# Patient Record
Sex: Male | Born: 1955
Health system: Southern US, Community
[De-identification: ages and names within clinical notes are randomized; demographics above are authoritative.]

## PROBLEM LIST (undated history)

## (undated) DIAGNOSIS — N529 Male erectile dysfunction, unspecified: Secondary | ICD-10-CM

## (undated) DIAGNOSIS — E785 Hyperlipidemia, unspecified: Secondary | ICD-10-CM

## (undated) DIAGNOSIS — K219 Gastro-esophageal reflux disease without esophagitis: Secondary | ICD-10-CM

## (undated) DIAGNOSIS — I10 Essential (primary) hypertension: Secondary | ICD-10-CM

## (undated) HISTORY — DX: Hyperlipidemia, unspecified: E78.5

## (undated) HISTORY — DX: Essential (primary) hypertension: I10

## (undated) HISTORY — PX: ANKLE SURGERY: SHX546

## (undated) HISTORY — DX: Male erectile dysfunction, unspecified: N52.9

## (undated) HISTORY — DX: Gastro-esophageal reflux disease without esophagitis: K21.9

## (undated) MED FILL — Fosaprepitant Dimeglumine For IV Infusion 150 MG (Base Eq): INTRAVENOUS | Qty: 5 | Status: AC

---

## 2005-02-19 ENCOUNTER — Ambulatory Visit: Payer: Self-pay | Admitting: Internal Medicine

## 2005-02-28 ENCOUNTER — Ambulatory Visit: Payer: Self-pay

## 2005-03-12 ENCOUNTER — Ambulatory Visit: Payer: Self-pay | Admitting: Internal Medicine

## 2005-03-15 ENCOUNTER — Ambulatory Visit: Payer: Self-pay | Admitting: Internal Medicine

## 2005-10-25 ENCOUNTER — Ambulatory Visit: Payer: Self-pay | Admitting: Internal Medicine

## 2006-02-28 ENCOUNTER — Ambulatory Visit: Payer: Self-pay | Admitting: Internal Medicine

## 2006-03-13 ENCOUNTER — Encounter: Admission: RE | Admit: 2006-03-13 | Discharge: 2006-03-13 | Payer: Self-pay | Admitting: General Surgery

## 2008-07-20 ENCOUNTER — Ambulatory Visit (HOSPITAL_COMMUNITY): Admission: RE | Admit: 2008-07-20 | Discharge: 2008-07-20 | Payer: Self-pay | Admitting: Chiropractic Medicine

## 2008-09-27 ENCOUNTER — Ambulatory Visit: Payer: Self-pay | Admitting: Internal Medicine

## 2008-09-27 DIAGNOSIS — I10 Essential (primary) hypertension: Secondary | ICD-10-CM | POA: Insufficient documentation

## 2008-09-27 DIAGNOSIS — K219 Gastro-esophageal reflux disease without esophagitis: Secondary | ICD-10-CM | POA: Insufficient documentation

## 2008-09-27 DIAGNOSIS — M549 Dorsalgia, unspecified: Secondary | ICD-10-CM | POA: Insufficient documentation

## 2008-10-10 ENCOUNTER — Encounter: Payer: Self-pay | Admitting: Internal Medicine

## 2009-01-09 ENCOUNTER — Ambulatory Visit: Payer: Self-pay | Admitting: Internal Medicine

## 2009-01-10 ENCOUNTER — Encounter (INDEPENDENT_AMBULATORY_CARE_PROVIDER_SITE_OTHER): Payer: Self-pay | Admitting: *Deleted

## 2009-01-10 LAB — CONVERTED CEMR LAB: PSA: 0.67 ng/mL (ref 0.10–4.00)

## 2009-02-07 ENCOUNTER — Ambulatory Visit: Payer: Self-pay | Admitting: Internal Medicine

## 2009-02-23 ENCOUNTER — Ambulatory Visit: Payer: Self-pay | Admitting: Internal Medicine

## 2009-02-28 ENCOUNTER — Encounter (INDEPENDENT_AMBULATORY_CARE_PROVIDER_SITE_OTHER): Payer: Self-pay | Admitting: *Deleted

## 2010-07-13 ENCOUNTER — Encounter (INDEPENDENT_AMBULATORY_CARE_PROVIDER_SITE_OTHER): Payer: Self-pay | Admitting: *Deleted

## 2010-11-01 ENCOUNTER — Ambulatory Visit: Payer: Self-pay | Admitting: Internal Medicine

## 2010-11-02 ENCOUNTER — Encounter: Payer: Self-pay | Admitting: Internal Medicine

## 2010-11-05 LAB — CONVERTED CEMR LAB
Albumin: 4.3 g/dL (ref 3.5–5.2)
Alkaline Phosphatase: 44 units/L (ref 39–117)
BUN: 15 mg/dL (ref 6–23)
Basophils Absolute: 0 10*3/uL (ref 0.0–0.1)
Basophils Relative: 0.6 % (ref 0.0–3.0)
Bilirubin, Direct: 0.1 mg/dL (ref 0.0–0.3)
CO2: 27 meq/L (ref 19–32)
Calcium: 10 mg/dL (ref 8.4–10.5)
Chloride: 109 meq/L (ref 96–112)
Cholesterol: 215 mg/dL — ABNORMAL HIGH (ref 0–200)
Eosinophils Relative: 3.8 % (ref 0.0–5.0)
Glucose, Bld: 99 mg/dL (ref 70–99)
Hemoglobin: 15.5 g/dL (ref 13.0–17.0)
Lymphocytes Relative: 28.9 % (ref 12.0–46.0)
Lymphs Abs: 2.2 10*3/uL (ref 0.7–4.0)
Neutro Abs: 4.5 10*3/uL (ref 1.4–7.7)
Neutrophils Relative %: 59 % (ref 43.0–77.0)
Potassium: 4.8 meq/L (ref 3.5–5.1)
RBC: 4.65 M/uL (ref 4.22–5.81)
RDW: 12.9 % (ref 11.5–14.6)
Sodium: 145 meq/L (ref 135–145)
Total CHOL/HDL Ratio: 6
Triglycerides: 180 mg/dL — ABNORMAL HIGH (ref 0.0–149.0)
VLDL: 36 mg/dL (ref 0.0–40.0)
WBC: 7.6 10*3/uL (ref 4.5–10.5)

## 2010-12-07 ENCOUNTER — Encounter (INDEPENDENT_AMBULATORY_CARE_PROVIDER_SITE_OTHER): Payer: Self-pay | Admitting: *Deleted

## 2010-12-11 ENCOUNTER — Ambulatory Visit: Payer: Self-pay | Admitting: Internal Medicine

## 2010-12-23 HISTORY — PX: COLONOSCOPY: SHX174

## 2010-12-27 ENCOUNTER — Ambulatory Visit
Admission: RE | Admit: 2010-12-27 | Discharge: 2010-12-27 | Payer: Self-pay | Source: Home / Self Care | Attending: Internal Medicine | Admitting: Internal Medicine

## 2010-12-27 ENCOUNTER — Encounter: Payer: Self-pay | Admitting: Internal Medicine

## 2011-01-20 LAB — CONVERTED CEMR LAB
Basophils Absolute: 0 10*3/uL (ref 0.0–0.1)
CO2: 31 meq/L (ref 19–32)
Calcium: 9.6 mg/dL (ref 8.4–10.5)
Chloride: 106 meq/L (ref 96–112)
Creatinine, Ser: 1.1 mg/dL (ref 0.4–1.5)
Direct LDL: 126.1 mg/dL
Eosinophils Relative: 0.9 % (ref 0.0–5.0)
HCT: 44.1 % (ref 39.0–52.0)
Lymphocytes Relative: 28.1 % (ref 12.0–46.0)
MCV: 95.8 fL (ref 78.0–100.0)
Monocytes Relative: 6.7 % (ref 3.0–12.0)
Platelets: 197 10*3/uL (ref 150–400)
RDW: 12.3 % (ref 11.5–14.6)
TSH: 2.42 microintl units/mL (ref 0.35–5.50)
Total CHOL/HDL Ratio: 5.7
Triglycerides: 132 mg/dL (ref 0–149)
VLDL: 26 mg/dL (ref 0–40)

## 2011-01-22 NOTE — Letter (Signed)
Summary: Primary Care Appointment Letter  [facility]  [address] [place], [place] [address]   Phone: [phone]  Fax: [phone]    07/13/2010 MRN: [id number]  SHAHIEM BEDWELL [address] [place], [place]  [address]  Dear Mr. Weber Cooks,   Your Primary Care Physician Atlanta E. Paz MD has indicated that:    ____X___it is time to schedule an appointment for CPX and Fasting Labs before further refills can be given last office visit 12/2008.   _______you missed your appointment on______ and need to call and          reschedule.    _______you need to have lab work done.    _______you need to schedule an appointment discuss lab or test results.    _______you need to call to reschedule your appointment that is                       scheduled on _________.     Please call our office as soon as possible. Our phone number is [phone]_____. Our office is open 8a-5p, Monday through Friday.     Thank you,    [facility]

## 2011-01-22 NOTE — Assessment & Plan Note (Signed)
Summary: CPX/FASTING//KN   Vital Signs:  Patient profile:   55 year old male Height:      69 inches Weight:      164.13 pounds BMI:     24.33 Pulse rate:   65 / minute Pulse rhythm:   regular BP sitting:   128 / 82  (left arm) Cuff size:   regular  Vitals Entered By: Army Fossa CMA (November 01, 2010 9:06 AM) CC: CPX, fasting Comments declines flu shot TD today Referral for colonscopy   History of Present Illness: CPX   Preventive Screening-Counseling & Management  Caffeine-Diet-Exercise     Does Patient Exercise: yes     Type of exercise: active @ work  Current Medications (verified): 1)  Carvedilol 6.25 Mg Tabs (Carvedilol) .Marland Kitchen.. 1 By Mouth Two Times A Day.  Allergies (verified): No Known Drug Allergies  Past History:  Past Medical History: Reviewed history from 01/09/2009 and no changes required. heart murmur as a child Hypertension GERD 2006: palpitations: Holter-ECHO negative; symptoms decreased w/ PPIs  Past Surgical History: Reviewed history from 09/27/2008 and no changes required. no  Family History: Reviewed history from 09/27/2008 and no changes required. CAD - no HTN - no DM - m family stroke - no colon Ca - no prostate Ca - no F - deceased -- tumor behind eye  Social History: original from 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 CV:  Denies chest pain or discomfort, palpitations, and swelling of feet; ambulatory BPs  ~ 120. Resp:  Denies cough and shortness of breath. GI:  Denies bloody stools, diarrhea, and nausea. GU:  Denies dysuria, hematuria, urinary frequency, and urinary hesitancy.  Physical Exam  General:  alert, well-developed, and well-nourished.   Neck:  no masses, no  thyromegaly, and normal carotid upstroke.   Lungs:  normal respiratory effort, no intercostal retractions, no accessory muscle use, and normal breath sounds.   Heart:  normal rate, regular rhythm, no murmur, and no gallop.   Abdomen:  soft, non-tender, no distention, and no masses.   Rectal:  No external abnormalities noted. Normal sphincter tone. No rectal masses or tenderness. Brown stool, Hemoccult negative Prostate:  Prostate gland firm and smooth, no enlargement, nodularity, tenderness, mass, asymmetry or induration. Extremities:  no lower extremity edema Neurologic:  alert & oriented X3, strength normal in all extremities, and gait normal.   Psych:  Cognition and judgment appear intact. Alert and cooperative with normal attention span and concentration.     Impression & Recommendations:  Problem # 1:  HEALTH SCREENING (ICD-V70.0) Td today declined flu shot he has a very active lifestyle, diet discussed never had a Cscope , interested in one---- referal alternative # home [phone]  Orders: Venipuncture (96045) TLB-BMP (Basic Metabolic Panel-BMET) (80048-METABOL) TLB-CBC Platelet - w/Differential (85025-CBCD) TLB-Hepatic/Liver Function Pnl (80076-HEPATIC) TLB-Lipid Panel (80061-LIPID) TLB-PSA (Prostate Specific Antigen) (84153-PSA) TLB-TSH (Thyroid Stimulating Hormone) (84443-TSH) Specimen Handling (40981) Gastroenterology Referral (GI)  Problem # 2:  HYPERTENSION (ICD-401.9) at goal    His updated medication list for this problem includes:    Carvedilol 6.25 Mg Tabs (Carvedilol) .Marland Kitchen... 1 by mouth two times a day.  BP today: 128/82 Prior BP: 132/84 (01/09/2009)  Labs Reviewed: K+: 4.8 (09/27/2008) Creat: : 1.1 (09/27/2008)   Chol: 201 (09/27/2008)  HDL: 35.3 (09/27/2008)   LDL: DEL (09/27/2008)   TG: 132 (09/27/2008)  Complete Medication List: 1)  Carvedilol 6.25 Mg Tabs (Carvedilol) .Marland Kitchen.. 1 by mouth two times a day.  Other Orders: Tdap => 68yrs IM (14782) Admin  1st Vaccine (95621)   Patient Instructions: 1)  Please schedule a follow-up appointment in 1 year;  fasting, physical exam  Prescriptions: CARVEDILOL 6.25 MG TABS (CARVEDILOL) 1 by mouth two times a day.  #60 x 5   Entered by:   Army Fossa CMA   Authorized by:   Nolon Rod. Paz MD   Signed by:   Army Fossa CMA on 11/01/2010   Method used:   [facility]  [address] [id number]       [address]       [place], [place]  [address]       Ph: [phone] or [phone]       Fax: [phone]   RxID:   [id number]    Orders Added: 1)  Tdap => 51yrs IM [90715] 2)  Admin 1st Vaccine [90471] 3)  Venipuncture [18841] 4)  TLB-BMP (Basic Metabolic Panel-BMET) [80048-METABOL] 5)  TLB-CBC Platelet - w/Differential [85025-CBCD] 6)  TLB-Hepatic/Liver Function Pnl [80076-HEPATIC] 7)  TLB-Lipid Panel [80061-LIPID] 8)  TLB-PSA (Prostate Specific Antigen) [84153-PSA] 9)  TLB-TSH (Thyroid Stimulating Hormone) [84443-TSH] 10)  Specimen Handling [99000] 11)  Est. Patient age 80-64 [phone] 19)  Gastroenterology Referral [GI]   Immunizations Administered:  Tetanus Vaccine:    Vaccine Type: Tdap    Site: left deltoid    Mfr: GlaxoSmithKline    Dose: 0.5 ml    Route: IM    Given by: Army Fossa CMA    Exp. Date: 10/11/2012    Lot #: [id number]   Immunizations Administered:  Tetanus Vaccine:    Vaccine Type: Tdap    Site: left deltoid    Mfr: GlaxoSmithKline    Dose: 0.5 ml    Route: IM    Given by: Army Fossa CMA    Exp. Date: 10/11/2012    Lot #: [id number]   Risk Factors:  Exercise:  yes    Type:  active @ work

## 2011-01-22 NOTE — Letter (Signed)
Summary: Pre Visit Letter Revised  [facility]  [address] [place], [place] [address]   Phone: [phone]  Fax: [phone]        11/02/2010 MRN: [id number]  Jerome Cruz [address] [place], [place]  [address]             Procedure Date:  12-27-2010 9:30am           Dr Leone Payor   Welcome to the Gastroenterology Division at [facility].    You are scheduled to see a nurse for your pre-procedure visit on 12-11-10 at 10am on the 3rd floor at [facility], [address]  We ask that you try to arrive at our office 15 minutes prior to your appointment time to allow for check-in.  Please take a minute to review the attached form.  If you answer "Yes" to one or more of the questions on the first page, we ask that you call the person listed at your earliest opportunity.  If you answer "No" to all of the questions, please complete the rest of the form and bring it to your appointment.    Your nurse visit will consist of discussing your medical and surgical history, your immediate family medical history, and your medications.   If you are unable to list all of your medications on the form, please bring the medication bottles to your appointment and we will list them.  We will need to be aware of both prescribed and over the counter drugs.  We will need to know exact dosage information as well.    Please be prepared to read and sign documents such as consent forms, a financial agreement, and acknowledgement forms.  If necessary, and with your consent, a friend or relative is welcome to sit-in on the nurse visit with you.  Please bring your insurance card so that we may make a copy of it.  If your insurance requires a referral to see a specialist, please bring your referral form from your primary care physician.  No co-pay is required for this nurse visit.     If you cannot keep your appointment, please call [phone] to cancel or reschedule prior to  your appointment date.  This allows [place] the opportunity to schedule an appointment for another patient in need of care.    Thank you for choosing [facility] for your medical needs.  We appreciate the opportunity to care for you.  Please visit [place] at our website  to learn more about our practice.  Sincerely, The Gastroenterology Division

## 2011-01-24 NOTE — Letter (Signed)
Summary: [facility] Instructions  [facility]  [address] [place], [place] [address]   Phone: [phone]  Fax: [phone]       Jerome Cruz    16-Oct-1967    MRN: [id number]        Procedure Day /Date: Thursday 12-27-10     Arrival Time: 8:30 am     Procedure Time: 9:30 am     Location of Procedure:                    _x _   [facility] (4th Floor)   PREPARATION FOR COLONOSCOPY WITH MOVIPREP   Starting 5 days prior to your procedure  12-22-10 do not eat nuts, seeds, popcorn, corn, beans, peas,  salads, or any raw vegetables.  Do not take any fiber supplements (e.g. Metamucil, Citrucel, and Benefiber).  THE DAY BEFORE YOUR PROCEDURE         DATE:  12-26-10  DAY:  Wednesday  1.  Drink clear liquids the entire day-NO SOLID FOOD  2.  Do not drink anything colored red or purple.  Avoid juices with pulp.  No orange juice.  3.  Drink at least 64 oz. (8 glasses) of fluid/clear liquids during the day to prevent dehydration and help the prep work efficiently.  CLEAR LIQUIDS INCLUDE: Water Jello Ice Popsicles Tea (sugar ok, no milk/cream) Powdered fruit flavored drinks Coffee (sugar ok, no milk/cream) Gatorade Juice: apple, white grape, white cranberry  Lemonade Clear bullion, consomm, broth Carbonated beverages (any kind) Strained chicken noodle soup Hard Candy                             4.  In the morning, mix first dose of MoviPrep solution:    Empty 1 Pouch A and 1 Pouch B into the disposable container    Add lukewarm drinking water to the top line of the container. Mix to dissolve    Refrigerate (mixed solution should be used within 24 hrs)  5.  Begin drinking the prep at 5:00 p.m. The MoviPrep container is divided by 4 marks.   Every 15 minutes drink the solution down to the next mark (approximately 8 oz) until the full liter is complete.   6.  Follow completed prep with 16 oz of clear liquid of your choice (Nothing red or purple).   Continue to drink clear liquids until bedtime.  7.  Before going to bed, mix second dose of MoviPrep solution:    Empty 1 Pouch A and 1 Pouch B into the disposable container    Add lukewarm drinking water to the top line of the container. Mix to dissolve    Refrigerate  THE DAY OF YOUR PROCEDURE      DATE:  12-27-10  DAY:  Thursday  Beginning at  4:30 a.m. (5 hours before procedure):         1. Every 15 minutes, drink the solution down to the next mark (approx 8 oz) until the full liter is complete.  2. Follow completed prep with 16 oz. of clear liquid of your choice.    3. You may drink clear liquids until  7:30 a.m. (2 HOURS BEFORE PROCEDURE).   MEDICATION INSTRUCTIONS  Unless otherwise instructed, you should take regular prescription medications with a small sip of water   as early as possible the morning of your procedure.       OTHER INSTRUCTIONS  You will  need a responsible adult at least 53 years of age to accompany you and drive you home.   This person must remain in the waiting room during your procedure.  Wear loose fitting clothing that is easily removed.  Leave jewelry and other valuables at home.  However, you may wish to bring a book to read or  an iPod/MP3 player to listen to music as you wait for your procedure to start.  Remove all body piercing jewelry and leave at home.  Total time from sign-in until discharge is approximately 2-3 hours.  You should go home directly after your procedure and rest.  You can resume normal activities the  day after your procedure.  The day of your procedure you should not:   Drive   Make legal decisions   Operate machinery   Drink alcohol   Return to work  You will receive specific instructions about eating, activities and medications before you leave.    The above instructions have been reviewed and explained to me by   Wyona Almas RN  December 11, 2010 10:37 AM     I fully understand and can  verbalize these instructions _____________________________ Date _________

## 2011-01-24 NOTE — Miscellaneous (Signed)
Summary: [facility] Previsit/prep  Clinical Lists Changes  Medications: Added new medication of MOVIPREP 100 GM  SOLR (PEG-KCL-NACL-NASULF-NA ASC-C) As per prep instructions. - Signed Rx of MOVIPREP 100 GM  SOLR (PEG-KCL-NACL-NASULF-NA ASC-C) As per prep instructions.;  #1 x 0;  Signed;  Entered by: Wyona Almas RN;  Authorized by: Iva Boop MD, FACG;  Method [facility]  [address] [id number] [address] [place], [place]  [address], Ph: [phone] or [phone], Fax: [phone] Observations: Added new observation of NKA: T (12/11/2010 9:30)    Prescriptions: MOVIPREP 100 GM  SOLR (PEG-KCL-NACL-NASULF-NA ASC-C) As per prep instructions.  #1 x 0   Entered by:   Wyona Almas RN   Authorized by:   Iva Boop MD, [facility]   Signed by:   Wyona Almas RN on 12/11/2010   Method used:   [facility]  [address] [id number]       [address]       [place], [place]  [address]       Ph: [phone] or [phone]       Fax: [phone]   RxID:   [id number]

## 2011-01-24 NOTE — Procedures (Signed)
Summary: Colonoscopy  Patient: Jerome Cruz Note: All result statuses are Final unless otherwise noted.  Tests: (1) Colonoscopy (COL)   COL Colonoscopy           DONE     [facility]     [address]     [place], [place]  [address]           COLONOSCOPY PROCEDURE REPORT           PATIENT:  Jerome, Cruz  MR#:  [id number]     BIRTHDATE:  1956-04-03, 54 yrs. old  GENDER:  male     ENDOSCOPIST:  Iva Boop, MD, [facility]     REF. BY:  Willow Ora, M.D.     PROCEDURE DATE:  12/27/2010     PROCEDURE:  Colonoscopy 19147     ASA CLASS:  Class II     INDICATIONS:  Routine Risk Screening     MEDICATIONS:   Fentanyl 50 mcg, Versed 7 mg IV           DESCRIPTION OF PROCEDURE:   After the risks benefits and     alternatives of the procedure were thoroughly explained, informed     consent was obtained.  Digital rectal exam was performed and     revealed no abnormalities and normal prostate.   The LB [id number]     [id number] endoscope was introduced through the anus and advanced to     the cecum, which was identified by both the appendix and ileocecal     valve, without limitations.  The quality of the prep was     excellent, using MoviPrep.  The instrument was then slowly     withdrawn as the colon was fully examined. Insertion: 3:14 minutes     Withdrawal: 7:21 minutes     <<PROCEDUREIMAGES>>           FINDINGS:  A normal appearing cecum, ileocecal valve, and     appendiceal orifice were identified. The ascending, hepatic     flexure, transverse, splenic flexure, descending, sigmoid colon,     and rectum appeared unremarkable.   Retroflexed views in the right     colon and rectum revealed no abnormalities.    The scope was then     withdrawn from the patient and the procedure completed.           COMPLICATIONS:  None     ENDOSCOPIC IMPRESSION:     1) Normal colonoscopy with excellent prep           REPEAT EXAM:  In 10 year(s) for routine screening colonoscopy.           Iva Boop, MD, [facility]           CC:  Willow Ora, MD and The Patient           n.     eSIGNED:   Iva Boop at 12/27/2010 10:17 AM           Jennell Corner, [phone]  Note: An exclamation mark (!) indicates a result that was not dispersed into the flowsheet. Document Creation Date: 12/27/2010 10:17 AM _______________________________________________________________________  (1) Order result status: Final Collection or observation date-time: 12/27/2010 10:05 Requested date-time:  Receipt date-time:  Reported date-time:  Referring Physician:   Ordering Physician: Stan Head [phone]) Specimen Source:  Source: Launa Grill Order Number: [id number] Lab site:   Appended Document: Colonoscopy    Clinical Lists Changes  Observations: Added new  observation of COLONNXTDUE: 12/2020 (12/27/2010 12:12)

## 2011-09-28 ENCOUNTER — Other Ambulatory Visit: Payer: Self-pay | Admitting: Internal Medicine

## 2011-09-30 NOTE — Telephone Encounter (Signed)
Done for 30 day supply. OV required for further refills.

## 2011-10-14 ENCOUNTER — Encounter (INDEPENDENT_AMBULATORY_CARE_PROVIDER_SITE_OTHER): Payer: BC Managed Care – PPO | Admitting: Ophthalmology

## 2011-10-14 DIAGNOSIS — H109 Unspecified conjunctivitis: Secondary | ICD-10-CM

## 2011-10-14 DIAGNOSIS — H43819 Vitreous degeneration, unspecified eye: Secondary | ICD-10-CM

## 2011-12-09 ENCOUNTER — Other Ambulatory Visit: Payer: Self-pay | Admitting: Internal Medicine

## 2012-02-05 ENCOUNTER — Encounter: Payer: BC Managed Care – PPO | Admitting: Internal Medicine

## 2012-02-07 ENCOUNTER — Other Ambulatory Visit: Payer: Self-pay | Admitting: Internal Medicine

## 2012-02-10 ENCOUNTER — Other Ambulatory Visit: Payer: Self-pay | Admitting: Internal Medicine

## 2012-02-10 NOTE — Telephone Encounter (Signed)
Refill request for coreg 6.25mg  #60 with zero refills. Last refilled on 12.17.12. OK to refill?

## 2012-02-12 NOTE — Telephone Encounter (Signed)
Refill request Coreg 6.25mg  #60 with zero refills. Pt has not been seen within a year. OK to refill?

## 2012-02-13 NOTE — Telephone Encounter (Signed)
Pt wife calling stating Pt has been our of med for a couple of days.Pt has pending appt 03-06-12.Rx sent Pt wife aware.

## 2012-03-04 ENCOUNTER — Telehealth: Payer: Self-pay | Admitting: Internal Medicine

## 2012-03-04 MED ORDER — CARVEDILOL 6.25 MG PO TABS: 6.2500 mg | ORAL_TABLET | Freq: Two times a day (BID) | ORAL | Status: DC

## 2012-03-04 NOTE — Telephone Encounter (Signed)
Refill: Carvedilol 6.25 mg tablet. # 60. Take 1 tablet by mouth twice a day. Last fill 12-09-11

## 2012-03-04 NOTE — Telephone Encounter (Signed)
Refill done.  

## 2012-03-06 ENCOUNTER — Encounter: Payer: Self-pay | Admitting: Internal Medicine

## 2012-03-06 ENCOUNTER — Ambulatory Visit (INDEPENDENT_AMBULATORY_CARE_PROVIDER_SITE_OTHER): Payer: BC Managed Care – PPO | Admitting: Internal Medicine

## 2012-03-06 DIAGNOSIS — Z0000 Encounter for general adult medical examination without abnormal findings: Secondary | ICD-10-CM | POA: Insufficient documentation

## 2012-03-06 LAB — COMPREHENSIVE METABOLIC PANEL
ALT: 32 U/L (ref 0–53)
AST: 22 U/L (ref 0–37)
Albumin: 4 g/dL (ref 3.5–5.2)
CO2: 29 mEq/L (ref 19–32)
Calcium: 9.4 mg/dL (ref 8.4–10.5)
Glucose, Bld: 104 mg/dL — ABNORMAL HIGH (ref 70–99)
Sodium: 140 mEq/L (ref 135–145)
Total Protein: 7.1 g/dL (ref 6.0–8.3)

## 2012-03-06 LAB — CBC WITH DIFFERENTIAL/PLATELET
Basophils Absolute: 0 10*3/uL (ref 0.0–0.1)
Basophils Relative: 0.4 % (ref 0.0–3.0)
Eosinophils Absolute: 0.1 10*3/uL (ref 0.0–0.7)
Eosinophils Relative: 1.4 % (ref 0.0–5.0)
Lymphocytes Relative: 22 % (ref 12.0–46.0)
Monocytes Absolute: 0.6 10*3/uL (ref 0.1–1.0)
Neutro Abs: 4.7 10*3/uL (ref 1.4–7.7)
RBC: 4.66 Mil/uL (ref 4.22–5.81)
WBC: 6.9 10*3/uL (ref 4.5–10.5)

## 2012-03-06 LAB — LDL CHOLESTEROL, DIRECT: Direct LDL: 144.8 mg/dL

## 2012-03-06 MED ORDER — CARVEDILOL 6.25 MG PO TABS: 6.2500 mg | ORAL_TABLET | Freq: Two times a day (BID) | ORAL | Status: DC

## 2012-03-06 NOTE — Progress Notes (Signed)
  Subjective:    Patient ID: Jerome Cruz, male    DOB: Jul 27, 1956, 56 y.o.   MRN: [id number]  HPI CPX Doing well, GERD symptoms well controlled with omeprazole when necessary, ambulatory BPs normal.  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   Cardiovascular: Negative for chest pain and leg swelling.  Gastrointestinal: Negative for abdominal pain and blood in stool.  Genitourinary: Negative for hematuria and difficulty urinating.  Psychiatric/Behavioral:       No depression or anxiety        Objective:   Physical Exam  General:  alert, well-developed, and well-nourished.   Neck:  no masses, no thyromegaly, and normal carotid upstroke.   Lungs:  normal respiratory effort, no intercostal retractions, no accessory muscle use, and normal breath sounds.   Heart:  normal rate, regular rhythm, no murmur, and no gallop.   Abdomen:  soft, non-tender, no distention, and no masses.   Rectal:  No external abnormalities noted. Normal sphincter tone. No rectal masses or tenderness. Brown stool. Prostate:  Prostate gland firm and smooth, no enlargement, nodularity, tenderness, mass, asymmetry or induration. Extremities:  no lower extremity edema Neurologic:  alert & oriented X3, strength normal in all extremities, and gait normal.   Psych:  Cognition and judgment appear intact. Alert and cooperative with normal attention span and concentration.          Assessment & Plan:

## 2012-03-06 NOTE — Assessment & Plan Note (Signed)
Td 2011 Cscope 2012, next 2022 BP and GERD well controlled, RF meds Diet-exercise counseled  Labs, RTC 1 year

## 2012-03-16 NOTE — Telephone Encounter (Signed)
Refilled  03-06-12

## 2012-04-01 ENCOUNTER — Other Ambulatory Visit: Payer: Self-pay | Admitting: Internal Medicine

## 2012-04-02 NOTE — Telephone Encounter (Signed)
Refill done.  

## 2012-04-20 ENCOUNTER — Encounter: Payer: Self-pay | Admitting: Internal Medicine

## 2012-04-20 ENCOUNTER — Ambulatory Visit (INDEPENDENT_AMBULATORY_CARE_PROVIDER_SITE_OTHER)
Admission: RE | Admit: 2012-04-20 | Discharge: 2012-04-20 | Disposition: A | Discharge status: Home / Self Care | Payer: BC Managed Care – PPO | Source: Ambulatory Visit | Attending: Internal Medicine | Admitting: Internal Medicine

## 2012-04-20 ENCOUNTER — Ambulatory Visit (INDEPENDENT_AMBULATORY_CARE_PROVIDER_SITE_OTHER): Payer: BC Managed Care – PPO | Admitting: Internal Medicine

## 2012-04-20 VITALS — BP 130/86 | HR 70 | Temp 98.1°F | Wt 164.0 lb

## 2012-04-20 DIAGNOSIS — M79673 Pain in unspecified foot: Secondary | ICD-10-CM

## 2012-04-20 DIAGNOSIS — M79609 Pain in unspecified limb: Secondary | ICD-10-CM

## 2012-04-20 NOTE — Assessment & Plan Note (Signed)
Distal L foot pain, stress fracture? Sprain? Plan: XR  Motrin Wide shoes w. Support

## 2012-04-20 NOTE — Patient Instructions (Signed)
Please get your x-ray at the other [facility]  office located at: [address] [place], across from [facility].  Please go to the basement, this is a walk-in facility, they are open from 8:30 to 5:30 PM. Phone number [phone]. ---- Motrin 200 mg over-the-counter 2 tablets every 6 hours as needed. Take with food, watch for stomach irritation Call if the pain is no better in 10 days to 2 weeks Wide shoes with support

## 2012-04-20 NOTE — Progress Notes (Signed)
  Subjective:    Patient ID: Jerome Cruz, male    DOB: 1956-09-15, 56 y.o.   MRN: [id number]  HPI Acute visit 10 days history of pain of the left foot, located distally, worse at the base of they third toe. Worse with walking. Has not taken any medication. 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 He is not  wearing any new shoes    Objective:   Physical Exam  Constitutional: He appears well-developed and well-nourished.  Musculoskeletal: He exhibits no edema.       Feet  symmetric, no redness, swelling. Palpation of the left foot triggered  pain when I touch the plantar aspect distally (worse by the  third and first toes)  Skin: He is not diaphoretic.          Assessment & Plan:

## 2012-04-27 ENCOUNTER — Telehealth: Payer: Self-pay

## 2012-04-27 DIAGNOSIS — M79673 Pain in unspecified foot: Secondary | ICD-10-CM

## 2012-04-27 NOTE — Telephone Encounter (Signed)
Message left on voicemail: Patient's wife left a message on VM stating she is trying to contact Lillia Abed (Dr.Paz assistant) to f/u on recent visit

## 2012-04-27 NOTE — Telephone Encounter (Signed)
Referral done

## 2012-04-27 NOTE — Telephone Encounter (Signed)
Refer to ortho please

## 2012-04-27 NOTE — Telephone Encounter (Signed)
Spoke with pt & stated that even though his x-ray came back normal he is still having pain in his foot. Pt wants to know if he needs to come back in for a follow-up visit or does he need to be referred to a specialist. Please advise.

## 2012-04-28 ENCOUNTER — Telehealth: Payer: Self-pay | Admitting: Internal Medicine

## 2012-04-28 NOTE — Telephone Encounter (Signed)
Spoke with pharmacy & pt has 4 refills left on file.

## 2012-04-28 NOTE — Telephone Encounter (Signed)
Refill: Carvedilol 6.25 mg tablet. 90 day supply

## 2012-09-03 ENCOUNTER — Ambulatory Visit: Payer: BC Managed Care – PPO | Admitting: Family Medicine

## 2012-09-03 VITALS — BP 140/90 | HR 70 | Temp 97.5°F | Resp 16 | Ht 69.0 in | Wt 163.0 lb

## 2012-09-03 DIAGNOSIS — H6120 Impacted cerumen, unspecified ear: Secondary | ICD-10-CM

## 2012-09-03 NOTE — Progress Notes (Signed)
[facility] [address] [place] [address] [phone]  Date:  09/03/2012   Name:  Jerome Cruz   DOB:  1956/01/02   MRN:  [id number]  PCP:  Willow Ora, MD    Chief Complaint: Cerumen Impaction   History of Present Illness:  Jerome Cruz is a 56 y.o. very pleasant male patient who presents with the following:  He is here today to evaluate an issue with his left ear.  He does not have pain, but feels that the ear may be clogged. He has had a cerumen impaction several years ago.  He thinks this may be the issue again today.   Patient Active Problem List  Diagnosis  . HYPERTENSION  . GERD  . General medical examination  . Foot pain    No past medical history on file.  No past surgical history on file.  History  Substance Use Topics  . Smoking status: Former Smoker    Quit date: 08/24/1999  . Smokeless tobacco: Not on file  . Alcohol Use: Not on file    No family history on file.  No Known Allergies  Medication list has been reviewed and updated.  Current Outpatient Prescriptions on File Prior to Visit  Medication Sig Dispense Refill  . olmesartan (BENICAR) 20 MG tablet Take 20 mg by mouth daily.      Marland Kitchen omeprazole (PRILOSEC) 20 MG capsule Take 20 mg by mouth daily.      . carvedilol (COREG) 6.25 MG tablet Take 1 tablet (6.25 mg total) by mouth 2 (two) times daily with a meal.  60 tablet  12  . carvedilol (COREG) 6.25 MG tablet TAKE 1 TABLET BY MOUTH TWICE A DAY  60 tablet  6    Review of Systems:  As per HPI- otherwise negative.   Physical Examination: Filed Vitals:   09/03/12 0940  BP: 140/90  Pulse: 70  Temp: 97.5 F (36.4 C)  Resp: 16   Filed Vitals:   09/03/12 0940  Height: 5\' 9"  (1.753 m)  Weight: 163 lb (73.936 kg)   Body mass index is 24.07 kg/(m^2). Ideal Body Weight: Weight in (lb) to have BMI = 25: 168.9    GEN: WDWN, NAD, Non-toxic, Alert & Oriented x 3 HEENT: Atraumatic, Normocephalic.  PEERL,  EOMI Ears and Nose: No external deformity. EXTR: No clubbing/cyanosis/edema NEURO: Normal gait.  PSYCH: Normally interactive. Conversant. Not depressed or anxious appearing.  Calm demeanor.  Left ear canal is blocked with cerumen.  Right ear normal, TM normal and right canal clear  Cerumen impaction resolved with irrigation- ear canal and TM normal  Assessment and Plan: 1. Cerumen impaction    Resolved with irrigation as above.  Follow- up if needed    COPLAND,JESSICA, MD

## 2012-12-31 ENCOUNTER — Encounter: Payer: Self-pay | Admitting: Physician Assistant

## 2012-12-31 ENCOUNTER — Ambulatory Visit: Payer: Self-pay | Admitting: Physician Assistant

## 2012-12-31 VITALS — BP 132/80 | HR 97 | Temp 98.4°F | Resp 16 | Ht 69.0 in | Wt 171.0 lb

## 2012-12-31 DIAGNOSIS — Z0289 Encounter for other administrative examinations: Secondary | ICD-10-CM

## 2012-12-31 NOTE — Progress Notes (Signed)
  Subjective:    Patient ID: Jerome Cruz, male    DOB: [birth year], 57 y.o.   MRN: [id number]  HPI   Jerome Cruz is a 57 yr old male here for DOT examination.  History of hypertension, well controlled.  Treated with valsartan-hctz.  Treated by Dr. Waynard Edwards at [facility].  Pt states he is otherwise healthy.  No corrective lenses.  No history of heart or lung disease.  No diabetes.    Review of Systems  Constitutional: Negative for fever, chills and fatigue.  HENT: Negative for hearing loss, ear pain, neck pain, neck stiffness and tinnitus.   Eyes: Negative for visual disturbance.  Respiratory: Negative for cough, shortness of breath and wheezing.   Cardiovascular: Negative for chest pain, palpitations and leg swelling.  Gastrointestinal: Negative.   Genitourinary: Negative.   Musculoskeletal: Negative for myalgias, back pain and arthralgias.  Neurological: Negative for dizziness, seizures, syncope, weakness and headaches.       Objective:   Physical Exam  Vitals reviewed. Constitutional: He is oriented to person, place, and time. He appears well-developed and well-nourished. No distress.  HENT:  Head: Normocephalic and atraumatic.  Right Ear: Tympanic membrane and ear canal normal.  Left Ear: Tympanic membrane and ear canal normal.  Mouth/Throat: Uvula is midline, oropharynx is clear and moist and mucous membranes are normal.  Eyes: Conjunctivae normal, EOM and lids are normal. Pupils are equal, round, and reactive to light.  Neck: Normal range of motion. Neck supple.  Cardiovascular: Normal rate, regular rhythm and normal heart sounds.  Exam reveals no gallop and no friction rub.   No murmur heard. Pulses:      Carotid pulses are 2+ on the right side, and 2+ on the left side.      Radial pulses are 2+ on the right side, and 2+ on the left side.       Dorsalis pedis pulses are 2+ on the right side, and 2+ on the left side.       Posterior tibial pulses are 2+ on the  right side, and 2+ on the left side.  Pulmonary/Chest: Effort normal and breath sounds normal. He has no wheezes. He has no rales.  Abdominal: Soft. Bowel sounds are normal. He exhibits no mass. There is no tenderness. There is no rebound and no guarding. Hernia confirmed negative in the right inguinal area and confirmed negative in the left inguinal area.  Musculoskeletal: Normal range of motion.       Cervical back: Normal.       Thoracic back: Normal.       Lumbar back: Normal.  Lymphadenopathy:    He has no cervical adenopathy.  Neurological: He is alert and oriented to person, place, and time. He has normal reflexes.  Skin: Skin is warm and dry.  Psychiatric: He has a normal mood and affect. His behavior is normal.     Filed Vitals:   12/31/12 0827  BP: 132/80  Pulse: 97  Temp: 98.4 F (36.9 C)  Resp: 16        Assessment & Plan:   1. Health examination of defined subpopulation     Jerome Cruz is a 57 yr old male here for DOT exam.  Physical exam is WNL, no limitations.  Hypertension well controlled on valsartan-hctz.  Ok to certify for 1 yr.  DOT card signed.

## 2013-02-06 ENCOUNTER — Other Ambulatory Visit: Payer: Self-pay

## 2013-10-28 ENCOUNTER — Other Ambulatory Visit: Payer: Self-pay

## 2013-12-31 ENCOUNTER — Ambulatory Visit: Payer: Self-pay | Admitting: Internal Medicine

## 2013-12-31 VITALS — BP 154/90 | HR 86 | Temp 98.0°F | Resp 16 | Ht 69.0 in | Wt 161.0 lb

## 2013-12-31 DIAGNOSIS — Z0289 Encounter for other administrative examinations: Secondary | ICD-10-CM

## 2013-12-31 NOTE — Progress Notes (Signed)
   Subjective:    Patient ID: Jerome Cruz, male    DOB: [birth year], 58 y.o.   MRN: [id number]  HPI Healthy, controlled HTN by IM doctor.   Review of Systems neg    Objective:   Physical Exam  Vitals reviewed. Constitutional: He is oriented to person, place, and time. He appears well-developed and well-nourished. No distress.  HENT:  Head: Normocephalic.  Right Ear: External ear normal.  Left Ear: External ear normal.  Nose: Nose normal.  Mouth/Throat: Oropharynx is clear and moist.  Eyes: Conjunctivae and EOM are normal. Pupils are equal, round, and reactive to light.  Neck: Normal range of motion. Neck supple. No tracheal deviation present. No thyromegaly present.  Cardiovascular: Normal rate, regular rhythm, normal heart sounds and intact distal pulses.   Pulmonary/Chest: Effort normal and breath sounds normal.  Abdominal: Soft. Bowel sounds are normal. There is no tenderness.  Genitourinary: Penis normal.  Musculoskeletal: Normal range of motion.  Neurological: He is alert and oriented to person, place, and time. He has normal reflexes. No cranial nerve deficit. He exhibits normal muscle tone. Coordination normal.  Psychiatric: He has a normal mood and affect. His behavior is normal. Judgment and thought content normal.          Assessment & Plan:  DOT 1 yr for htn

## 2013-12-31 NOTE — Patient Instructions (Signed)
DASH Diet  The DASH diet stands for "Dietary Approaches to Stop Hypertension." It is a healthy eating plan that has been shown to reduce high blood pressure (hypertension) in as little as 14 days, while also possibly providing other significant health benefits. These other health benefits include reducing the risk of breast cancer after menopause and reducing the risk of type 2 diabetes, heart disease, colon cancer, and stroke. Health benefits also include weight loss and slowing kidney failure in patients with chronic kidney disease.   DIET GUIDELINES  · Limit salt (sodium). Your diet should contain less than 1500 mg of sodium daily.  · Limit refined or processed carbohydrates. Your diet should include mostly whole grains. Desserts and added sugars should be used sparingly.  · Include small amounts of heart-healthy fats. These types of fats include nuts, oils, and tub margarine. Limit saturated and trans fats. These fats have been shown to be harmful in the body.  CHOOSING FOODS   The following food groups are based on a 2000 calorie diet. See your Registered Dietitian for individual calorie needs.  Grains and Grain Products (6 to 8 servings daily)  · Eat More Often: Whole-wheat bread, brown rice, whole-grain or wheat pasta, quinoa, popcorn without added fat or salt (air popped).  · Eat Less Often: White bread, white pasta, white rice, cornbread.  Vegetables (4 to 5 servings daily)  · Eat More Often: Fresh, frozen, and canned vegetables. Vegetables may be raw, steamed, roasted, or grilled with a minimal amount of fat.  · Eat Less Often/Avoid: Creamed or fried vegetables. Vegetables in a cheese sauce.  Fruit (4 to 5 servings daily)  · Eat More Often: All fresh, canned (in natural juice), or frozen fruits. Dried fruits without added sugar. One hundred percent fruit juice (½ cup [237 mL] daily).  · Eat Less Often: Dried fruits with added sugar. Canned fruit in light or heavy syrup.  Lean Meats, Fish, and Poultry (2  servings or less daily. One serving is 3 to 4 oz [85-114 g]).  · Eat More Often: Ninety percent or leaner ground beef, tenderloin, sirloin. Round cuts of beef, chicken breast, turkey breast. All fish. Grill, bake, or broil your meat. Nothing should be fried.  · Eat Less Often/Avoid: Fatty cuts of meat, turkey, or chicken leg, thigh, or wing. Fried cuts of meat or fish.  Dairy (2 to 3 servings)  · Eat More Often: Low-fat or fat-free milk, low-fat plain or light yogurt, reduced-fat or part-skim cheese.  · Eat Less Often/Avoid: Milk (whole, 2%). Whole milk yogurt. Full-fat cheeses.  Nuts, Seeds, and Legumes (4 to 5 servings per week)  · Eat More Often: All without added salt.  · Eat Less Often/Avoid: Salted nuts and seeds, canned beans with added salt.  Fats and Sweets (limited)  · Eat More Often: Vegetable oils, tub margarines without trans fats, sugar-free gelatin. Mayonnaise and salad dressings.  · Eat Less Often/Avoid: Coconut oils, palm oils, butter, stick margarine, cream, half and half, cookies, candy, pie.  FOR MORE INFORMATION  The Dash Diet Eating Plan: www.dashdiet.org  Document Released: 11/28/2011 Document Revised: 03/02/2012 Document Reviewed: 11/28/2011  ExitCare® Patient Information ©2014 [employer].

## 2014-10-07 ENCOUNTER — Other Ambulatory Visit: Payer: Self-pay

## 2015-01-02 ENCOUNTER — Ambulatory Visit (INDEPENDENT_AMBULATORY_CARE_PROVIDER_SITE_OTHER): Payer: Self-pay | Admitting: Family Medicine

## 2015-01-02 VITALS — BP 136/86 | HR 73 | Temp 98.3°F | Resp 17 | Ht 69.0 in | Wt 167.0 lb

## 2015-01-02 DIAGNOSIS — Z0289 Encounter for other administrative examinations: Secondary | ICD-10-CM

## 2015-01-02 DIAGNOSIS — Z008 Encounter for other general examination: Secondary | ICD-10-CM

## 2015-01-02 DIAGNOSIS — I10 Essential (primary) hypertension: Secondary | ICD-10-CM

## 2015-01-02 NOTE — Progress Notes (Signed)
Chief Complaint:  Chief Complaint  Patient presents with  . Employment Physical    DOT     HPI: Jerome Cruz is a 59 y.o. 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  He has no complaints He denies any  history of MI/OSA/DM; denies CP/SOB/palpitations He is compliant with his meds, he has no SEs He denies having SI/HI/hallucinations   Past Medical History  Diagnosis Date  . Hypertension    No past surgical history on file. History   Social History  . Marital Status: Married    Spouse Name: N/A    Number of Children: N/A  . Years of Education: N/A   Social History Main Topics  . Smoking status: Former Smoker    Quit date: 08/24/1999  . Smokeless tobacco: None  . Alcohol Use: None  . Drug Use: None  . Sexual Activity: None   Other Topics Concern  . None   Social History Narrative   No family history on file. No Known Allergies Prior to Admission medications   Medication Sig Start Date End Date Taking? Authorizing Provider  omeprazole (PRILOSEC) 20 MG capsule Take 20 mg by mouth daily.   Yes Historical Provider, MD  valsartan-hydrochlorothiazide (DIOVAN-HCT) 160-12.5 MG per tablet Take 1 tablet by mouth daily.   Yes Historical Provider, MD     ROS: The patient denies fevers, chills, night sweats, unintentional weight loss, chest pain, palpitations, wheezing, dyspnea on exertion, nausea, vomiting, abdominal pain, dysuria, hematuria, melena, numbness, weakness, or tingling.   All other systems have been reviewed and were otherwise negative with the exception of those mentioned in the HPI and as above.    PHYSICAL EXAM: Filed Vitals:   01/02/15 0835  BP: 136/86  Pulse: 73  Temp: 98.3 F (36.8 C)  Resp: 17   Filed Vitals:   01/02/15 0835  Height: 5\' 9"  (1.753 m)  Weight: 167 lb (75.751 kg)   Body mass index is 24.65 kg/(m^2).  General: Alert, no acute  distress HEENT:  Normocephalic, atraumatic, oropharynx patent. EOMI, PERRLA Cardiovascular:  Regular rate and rhythm, no rubs murmurs or gallops.  No Carotid bruits, radial pulse intact. No pedal edema.  Respiratory: Clear to auscultation bilaterally.  No wheezes, rales, or rhonchi.  No cyanosis, no use of accessory musculature GI: No organomegaly, abdomen is soft and non-tender, positive bowel sounds.  No masses. Skin: No rashes. Neurologic: Facial musculature symmetric. Psychiatric: Patient is appropriate throughout our interaction. Lymphatic: No cervical lymphadenopathy Musculoskeletal: Gait intact. GU nl 5/5 strength, full ROM in UE and Timarie Labell, neck nl   LABS: Results for orders placed or performed in visit on 03/06/12  Comprehensive metabolic panel  Result Value Ref Range   Sodium 140 135 - 145 mEq/L   Potassium 4.4 3.5 - 5.1 mEq/L   Chloride 102 96 - 112 mEq/L   CO2 29 19 - 32 mEq/L   Glucose, Bld 104 (H) 70 - 99 mg/dL   BUN 17 6 - 23 mg/dL   Creatinine, Ser 1.1 0.4 - 1.5 mg/dL   Total Bilirubin 0.5 0.3 - 1.2 mg/dL   Alkaline Phosphatase 46 39 - 117 U/L   AST 22 0 - 37 U/L   ALT 32 0 - 53 U/L   Total Protein 7.1 6.0 - 8.3 g/dL   Albumin 4.0 3.5 - 5.2 g/dL   Calcium 9.4 8.4 -  10.5 mg/dL   GFR 77.57 >60.00 mL/min  CBC with Differential  Result Value Ref Range   WBC 6.9 4.5 - 10.5 K/uL   RBC 4.66 4.22 - 5.81 Mil/uL   Hemoglobin 14.9 13.0 - 17.0 g/dL   HCT 44.9 39.0 - 52.0 %   MCV 96.4 78.0 - 100.0 fl   MCHC 33.2 30.0 - 36.0 g/dL   RDW 13.1 11.5 - 14.6 %   Platelets 174.0 150.0 - 400.0 K/uL   Neutrophils Relative % 67.8 43.0 - 77.0 %   Lymphocytes Relative 22.0 12.0 - 46.0 %   Monocytes Relative 8.4 3.0 - 12.0 %   Eosinophils Relative 1.4 0.0 - 5.0 %   Basophils Relative 0.4 0.0 - 3.0 %   Neutro Abs 4.7 1.4 - 7.7 K/uL   Lymphs Abs 1.5 0.7 - 4.0 K/uL   Monocytes Absolute 0.6 0.1 - 1.0 K/uL   Eosinophils Absolute 0.1 0.0 - 0.7 K/uL   Basophils Absolute 0.0 0.0 - 0.1  K/uL  Lipid panel  Result Value Ref Range   Cholesterol 213 (H) 0 - 200 mg/dL   Triglycerides 93.0 0.0 - 149.0 mg/dL   HDL 40.60 >39.00 mg/dL   VLDL 18.6 0.0 - 40.0 mg/dL   Total CHOL/HDL Ratio 5   TSH  Result Value Ref Range   TSH 2.26 0.35 - 5.50 uIU/mL  PSA  Result Value Ref Range   PSA 0.68 0.10 - 4.00 ng/mL  LDL cholesterol, direct  Result Value Ref Range   Direct LDL 144.8 mg/dL     EKG/XRAY:   Primary read interpreted by Dr. Marin Comment at [facility].   ASSESSMENT/PLAN: Encounter Diagnoses  Name Primary?  . Health examination of defined subpopulation Yes  . Essential hypertension    Nl PE F/u in 1 year for HTN  Gross sideeffects, risk and benefits, and alternatives of medications d/w patient. Patient is aware that all medications have potential sideeffects and we are unable to predict every sideeffect or drug-drug interaction that may occur.  Samuele Storey, [place], DO 01/02/2015 9:49 AM

## 2015-06-29 ENCOUNTER — Encounter (HOSPITAL_COMMUNITY): Payer: Self-pay

## 2015-06-29 ENCOUNTER — Emergency Department (HOSPITAL_COMMUNITY)
Admission: EM | Admit: 2015-06-29 | Discharge: 2015-06-29 | Disposition: A | Discharge status: Home / Self Care | Payer: BLUE CROSS/BLUE SHIELD | Attending: Emergency Medicine | Admitting: Emergency Medicine

## 2015-06-29 ENCOUNTER — Emergency Department (HOSPITAL_COMMUNITY): Payer: BLUE CROSS/BLUE SHIELD

## 2015-06-29 DIAGNOSIS — Y998 Other external cause status: Secondary | ICD-10-CM | POA: Insufficient documentation

## 2015-06-29 DIAGNOSIS — W1789XA Other fall from one level to another, initial encounter: Secondary | ICD-10-CM | POA: Insufficient documentation

## 2015-06-29 DIAGNOSIS — I10 Essential (primary) hypertension: Secondary | ICD-10-CM | POA: Insufficient documentation

## 2015-06-29 DIAGNOSIS — Z87891 Personal history of nicotine dependence: Secondary | ICD-10-CM | POA: Insufficient documentation

## 2015-06-29 DIAGNOSIS — S82851A Displaced trimalleolar fracture of right lower leg, initial encounter for closed fracture: Secondary | ICD-10-CM

## 2015-06-29 DIAGNOSIS — S82854A Nondisplaced trimalleolar fracture of right lower leg, initial encounter for closed fracture: Secondary | ICD-10-CM | POA: Insufficient documentation

## 2015-06-29 DIAGNOSIS — Z79899 Other long term (current) drug therapy: Secondary | ICD-10-CM | POA: Insufficient documentation

## 2015-06-29 DIAGNOSIS — S99921A Unspecified injury of right foot, initial encounter: Secondary | ICD-10-CM | POA: Diagnosis present

## 2015-06-29 DIAGNOSIS — Y9289 Other specified places as the place of occurrence of the external cause: Secondary | ICD-10-CM | POA: Diagnosis not present

## 2015-06-29 DIAGNOSIS — Y9389 Activity, other specified: Secondary | ICD-10-CM | POA: Insufficient documentation

## 2015-06-29 MED ORDER — HYDROCODONE-IBUPROFEN 5-200 MG PO TABS
1.0000 | ORAL_TABLET | Freq: Three times a day (TID) | ORAL | Status: DC | PRN
Start: 2015-06-29 — End: 2017-12-11

## 2015-06-29 MED ORDER — OXYCODONE-ACETAMINOPHEN 5-325 MG PO TABS
1.0000 | ORAL_TABLET | Freq: Once | ORAL | Status: AC
  Administered 2015-06-29: 1 via ORAL
  Filled 2015-06-29: qty 1

## 2015-06-29 NOTE — ED Notes (Signed)
PA at the bedside.

## 2015-06-29 NOTE — Discharge Instructions (Signed)
Ankle Fracture A fracture is a break in a bone. The ankle joint is made up of three bones. These include the lower (distal)sections of your lower leg bones, called the tibia and fibula, along with a bone in your foot, called the talus. Depending on how bad the break is and if more than one ankle joint bone is broken, a cast or splint is used to protect and keep your injured bone from moving while it heals. Sometimes, surgery is required to help the fracture heal properly.  There are two general types of fractures:  Stable fracture. This includes a single fracture line through one bone, with no injury to ankle ligaments. A fracture of the talus that does not have any displacement (movement of the bone on either side of the fracture line) is also stable.  Unstable fracture. This includes more than one fracture line through one or more bones in the ankle joint. It also includes fractures that have displacement of the bone on either side of the fracture line. CAUSES  A direct blow to the ankle.   Quickly and severely twisting your ankle.  Trauma, such as a car accident or falling from a significant height. RISK FACTORS You may be at a higher risk of ankle fracture if:  You have certain medical conditions.  You are involved in high-impact sports.  You are involved in a high-impact car accident. SIGNS AND SYMPTOMS   Tender and swollen ankle.  Bruising around the injured ankle.  Pain on movement of the ankle.  Difficulty walking or putting weight on the ankle.  A cold foot below the site of the ankle injury. This can occur if the blood vessels passing through your injured ankle were also damaged.  Numbness in the foot below the site of the ankle injury. DIAGNOSIS  An ankle fracture is usually diagnosed with a physical exam and X-rays. A CT scan may also be required for complex fractures. TREATMENT  Stable fractures are treated with a cast or splint and using crutches to avoid putting  weight on your injured ankle. This is followed by an ankle strengthening program. Some patients require a special type of cast, depending on other medical problems they may have. Unstable fractures require surgery to ensure the bones heal properly. Your health care provider will tell you what type of fracture you have and the best treatment for your condition. HOME CARE INSTRUCTIONS   Review correct crutch use with your health care provider and use your crutches as directed. Safe use of crutches is extremely important. Misuse of crutches can cause you to fall or cause injury to nerves in your hands or armpits.  Do not put weight or pressure on the injured ankle until directed by your health care provider.  To lessen the swelling, keep the injured leg elevated while sitting or lying down.  Apply ice to the injured area:  Put ice in a plastic bag.  Place a towel between your cast and the bag.  Leave the ice on for 20 minutes, 2-3 times a day.  If you have a plaster or fiberglass cast:  Do not try to scratch the skin under the cast with any objects. This can increase your risk of skin infection.  Check the skin around the cast every day. You may put lotion on any red or sore areas.  Keep your cast dry and clean.  If you have a plaster splint:  Wear the splint as directed.  You may loosen the elastic  around the splint if your toes become numb, tingle, or turn cold or blue.  Do not put pressure on any part of your cast or splint; it may break. Rest your cast only on a pillow the first 24 hours until it is fully hardened.  Your cast or splint can be protected during bathing with a plastic bag sealed to your skin with medical tape. Do not lower the cast or splint into water.  Take medicines as directed by your health care provider. Only take over-the-counter or prescription medicines for pain, discomfort, or fever as directed by your health care provider.  Do not drive a vehicle until  your health care provider specifically tells you it is safe to do so.  If your health care provider has given you a follow-up appointment, it is very important to keep that appointment. Not keeping the appointment could result in a chronic or permanent injury, pain, and disability. If you have any problem keeping the appointment, call the facility for assistance. SEEK MEDICAL CARE IF: You develop increased swelling or discomfort. SEEK IMMEDIATE MEDICAL CARE IF:   Your cast gets damaged or breaks.  You have continued severe pain.  You develop new pain or swelling after the cast was put on.  Your skin or toenails below the injury turn blue or gray.  Your skin or toenails below the injury feel cold, numb, or have loss of sensitivity to touch.  There is a bad smell or pus draining from under the cast. MAKE SURE YOU:   Understand these instructions.  Will watch your condition.  Will get help right away if you are not doing well or get worse. Document Released: 12/06/2000 Document Revised: 12/14/2013 Document Reviewed: 07/08/2013 [facility] Patient Information 2015 [place], [place]. This information is not intended to replace advice given to you by your health care provider. Make sure you discuss any questions you have with your health care provider.  Please read attached information, please return to emergency room if no worsening signs or symptoms present. Please use medication only as directed, don't drink drive or operate machinery while taking. Please follow-up with orthopedist for further evaluation and management.

## 2015-06-29 NOTE — ED Notes (Signed)
Patient transported to X-ray 

## 2015-06-29 NOTE — ED Notes (Signed)
Patient returned from xray.

## 2015-06-29 NOTE — Progress Notes (Signed)
Orthopedic Tech Progress Note Patient Details:  Jerome Cruz 20-Nov-1956 [id number] Applied fiberglass posterior short leg splint and fiberglass stirrup splint to RLE.  Pulses, sensation, motion intact before and after splinting.  Capillary refill less than 2 seconds before and after splinting.  Fit pt. for crutches and taught use of same. Ortho Devices Type of Ortho Device: Stirrup splint, Post (short leg) splint, Crutches Ortho Device/Splint Location: RLE Ortho Device/Splint Interventions: Application   Darrol Poke 06/29/2015, 2:32 PM

## 2015-06-29 NOTE — ED Notes (Signed)
Per Patient, Patient was working on the roof of a play house when he slid off the side and landed feet first in some tall grass. Patient reports the roof being about five feet high. When he got up to walk, pain in his right ankle was felt and patient was able to bear a little weight.

## 2015-06-29 NOTE — ED Provider Notes (Signed)
CSN: [id number]     Arrival date & time 06/29/15  1206 History  This chart was scribed for non-physician practitioner Lenn Sink, PA, working with Virgel Manifold, MD, by Eustaquio Maize, ED Scribe. This patient was seen in room TR08C/TR08C and the patient's care was started at 1:39 PM.  Chief Complaint  Patient presents with  . Foot Injury   The history is provided by the patient. No language interpreter was used.     HPI Comments: Jerome Cruz is a 59 y.o. male who presents to the Emergency Department complaining of sudden onset, right ankle pain that occurred earlier today. Pt was working on a play house roof  when he slid off, landing on his feet. Pt states that the roof was approximately 5 feet tall. Unable to ambulate. No loss of distal sensation or perfusion. He denies any other symptoms.   Past Medical History  Diagnosis Date  . Hypertension    History reviewed. No pertinent past surgical history. No family history on file. History  Substance Use Topics  . Smoking status: Former Smoker    Quit date: 08/24/1999  . Smokeless tobacco: Never Used  . Alcohol Use: No    Review of Systems  All other systems reviewed and are negative.  Allergies  Review of patient's allergies indicates no known allergies.  Home Medications   Prior to Admission medications   Medication Sig Start Date End Date Taking? Authorizing Provider  hydrocodone-ibuprofen (VICOPROFEN) 5-200 MG per tablet Take 1 tablet by mouth every 8 (eight) hours as needed for pain. 06/29/15   Okey Regal, PA-C  omeprazole (PRILOSEC) 20 MG capsule Take 20 mg by mouth daily.    Historical Provider, MD  valsartan-hydrochlorothiazide (DIOVAN-HCT) 160-12.5 MG per tablet Take 1 tablet by mouth daily.    Historical Provider, MD   Triage Vitals: BP 127/82 mmHg  Pulse 70  Temp(Src) 97.9 F (36.6 C) (Oral)  Resp 16  SpO2 97%   Physical Exam  Constitutional: He is oriented to person, place, and time. He appears  well-developed and well-nourished. No distress.  HENT:  Head: Normocephalic and atraumatic.  Eyes: Conjunctivae and EOM are normal.  Neck: Neck supple. No tracheal deviation present.  Cardiovascular: Normal rate, regular rhythm, normal heart sounds and intact distal pulses.  Exam reveals no gallop and no friction rub.   No murmur heard. Pulmonary/Chest: Effort normal and breath sounds normal. No respiratory distress. He has no wheezes. He has no rales. He exhibits no tenderness.  Musculoskeletal: He exhibits tenderness.  Obvious swelling to th medial and lateral aspect of right ankle.  Tenderness to palpation to medial lateral malleolus and distal fibula Reduced ROM of ankle due to pain Sensation and perfusion intact distally 2+ Pedal pulses Cap refill < 2 seconds  Neurological: He is alert and oriented to person, place, and time. He has normal strength. No cranial nerve deficit or sensory deficit. Coordination and gait normal. GCS eye subscore is 4. GCS verbal subscore is 5. GCS motor subscore is 6.  Skin: Skin is warm and dry.  Psychiatric: He has a normal mood and affect. His behavior is normal.  Nursing note and vitals reviewed.   ED Course  Procedures (including critical care time)  DIAGNOSTIC STUDIES: Oxygen Saturation is 97% on RA, normal by my interpretation.    COORDINATION OF CARE: 1:41 PM-Discussed treatment plan which includes consult to orthopedic surgeon with pt at bedside and pt agreed to plan.   Labs Review Labs Reviewed - No data  to display  Imaging Review Dg Ankle Complete Right  06/29/2015   CLINICAL DATA:  Status post 5 foot fall today. Right ankle pain and swelling. Initial encounter.  EXAM: RIGHT ANKLE - COMPLETE 3+ VIEW  COMPARISON:  None.  FINDINGS: The patient has an oblique fracture of the distal fibula and a medial malleolar fracture. There is also a nondisplaced posterior malleolar fracture. Soft tissue swelling is present about the ankle.  IMPRESSION:  Acute trimalleolar fractures with associated soft tissue swelling.   Electronically Signed   By: Inge Rise M.D.   On: 06/29/2015 13:31   Dg Foot Complete Right  06/29/2015   CLINICAL DATA:  Fall  EXAM: RIGHT FOOT COMPLETE - 3+ VIEW  COMPARISON:  Right ankle from today  FINDINGS: Bimalleolar fracture of the ankle.  No fracture in the foot.  No arthropathy.  IMPRESSION: Bimalleolar ankle fracture.  Negative for foot fracture.   Electronically Signed   By: Franchot Gallo M.D.   On: 06/29/2015 13:29     EKG Interpretation None      MDM   Final diagnoses:  Trimalleolar fracture, right, closed, initial encounter   Labs: None  Imaging: DG R Ankle - Acute trimalleolar fractures with associated selling; DG R Foot - Bimalleolar ankle fracture. Negative for foot fracture.   Consults: 2:10 PM Discussed case with Orthopedic surgeon, Dr. Doran Durand - advised to splint pt and to have him follow up on Monday, 07/03/2015.   Therapeutics: 5-325 mg Percocet  Assessment:  Plan: Patient presents with a acute closed trimalleolar fracture of the right ankle. Dr. Doran Durand was consult at reviewed patient's x-rays and instructed to place and splint crutches elevate, pain medication and follow-up with him Friday or Monday for further evaluation and management. Patient was splinted here, given adequate pain medication for home, rice instructions, strict return precautions. Patient verbalizes understanding and agreement for today's plan.          Okey Regal, PA-C 06/29/15 [place], MD 07/03/15 [phone]

## 2016-01-04 ENCOUNTER — Ambulatory Visit (INDEPENDENT_AMBULATORY_CARE_PROVIDER_SITE_OTHER): Payer: Self-pay | Admitting: Physician Assistant

## 2016-01-04 VITALS — BP 132/84 | HR 95 | Temp 98.3°F | Resp 18 | Ht 69.0 in | Wt 159.0 lb

## 2016-01-04 DIAGNOSIS — Z0289 Encounter for other administrative examinations: Secondary | ICD-10-CM

## 2016-01-04 NOTE — Progress Notes (Signed)
[facility] [address], [place] [address] [phone]  Date:  01/04/2016   Name:  Jerome Cruz   DOB:  [birth year]   MRN:  [id number]  PCP:  Kathlene November, MD    History of Present Illness:  Jerome Cruz is a 60 y.o. male patient who presents to [facility] for DOT.      No complaints or concerns.    NO pertinent problems.   Patient Active Problem List   Diagnosis Date Noted  . Foot pain 04/20/2012  . General medical examination 03/06/2012  . HYPERTENSION 09/27/2008  . GERD 09/27/2008    Past Medical History  Diagnosis Date  . Hypertension   . GERD (gastroesophageal reflux disease)     Past Surgical History  Procedure Laterality Date  . Ankle surgery Right     Social History  Substance Use Topics  . Smoking status: Former Smoker    Quit date: 08/24/1999  . Smokeless tobacco: Never Used  . Alcohol Use: No    History reviewed. No pertinent family history.  No Known Allergies  Medication list has been reviewed and updated.  Current Outpatient Prescriptions on File Prior to Visit  Medication Sig Dispense Refill  . valsartan-hydrochlorothiazide (DIOVAN-HCT) 160-12.5 MG per tablet Take 1 tablet by mouth daily.    . hydrocodone-ibuprofen (VICOPROFEN) 5-200 MG per tablet Take 1 tablet by mouth every 8 (eight) hours as needed for pain. (Patient not taking: Reported on 01/04/2016) 30 tablet 0  . omeprazole (PRILOSEC) 20 MG capsule Take 20 mg by mouth daily. Reported on 01/04/2016     No current facility-administered medications on file prior to visit.    Review of Systems  Constitutional: Negative for fever and chills.  HENT: Negative for ear discharge, ear pain and sore throat.   Eyes: Negative for blurred vision and double vision.  Respiratory: Negative for cough, shortness of breath and wheezing.   Cardiovascular: Negative for chest pain, palpitations and leg swelling.  Gastrointestinal: Negative for nausea, vomiting and diarrhea.   Genitourinary: Negative for dysuria, frequency and hematuria.  Skin: Negative for itching and rash.  Neurological: Negative for dizziness and headaches.     Physical Examination: BP 132/84 mmHg  Pulse 95  Temp(Src) 98.3 F (36.8 C) (Oral)  Resp 18  Ht 5\' 9"  (1.753 m)  Wt 159 lb (72.122 kg)  BMI 23.47 kg/m2  SpO2 99% Ideal Body Weight: Weight in (lb) to have BMI = 25: 168.9  Physical Exam  Constitutional: He is oriented to person, place, and time. He appears well-developed and well-nourished. No distress.  HENT:  Head: Normocephalic and atraumatic.  Right Ear: Tympanic membrane, external ear and ear canal normal.  Left Ear: Tympanic membrane, external ear and ear canal normal.  Eyes: Conjunctivae and EOM are normal. Pupils are equal, round, and reactive to light.  Cardiovascular: Normal rate and regular rhythm.  Exam reveals no friction rub.   No murmur heard. Pulmonary/Chest: Effort normal. No respiratory distress. He has no wheezes.  Abdominal: Soft. Bowel sounds are normal. He exhibits no distension and no mass. There is no tenderness. Hernia confirmed negative in the right inguinal area and confirmed negative in the left inguinal area.  Genitourinary: Testes normal.  Musculoskeletal: Normal range of motion. He exhibits no edema or tenderness.  Neurological: He is alert and oriented to person, place, and time. He displays normal reflexes.  Skin: Skin is warm and dry. He is not diaphoretic.  Psychiatric: He has a  normal mood and affect. His behavior is normal.    Visual Acuity Screening   Right eye Left eye Both eyes  Without correction:     With correction: 20/15 20/15 20/15   Comments: 70% both eyes  Color-6/6  Hearing Screening Comments: Whisper-9 ft both ears    Assessment and Plan: Jerome Cruz is a 60 y.o. male who is here today for DOT.  Encounter for examination required by Department of Transportation (DOT)  Ivar Drape, PA-C [facility] 01/04/2016 9:04 AM

## 2017-03-14 DIAGNOSIS — H04123 Dry eye syndrome of bilateral lacrimal glands: Secondary | ICD-10-CM | POA: Diagnosis not present

## 2017-10-16 DIAGNOSIS — M7541 Impingement syndrome of right shoulder: Secondary | ICD-10-CM | POA: Diagnosis not present

## 2017-10-16 DIAGNOSIS — M25511 Pain in right shoulder: Secondary | ICD-10-CM | POA: Diagnosis not present

## 2017-10-19 DIAGNOSIS — Z23 Encounter for immunization: Secondary | ICD-10-CM | POA: Diagnosis not present

## 2017-12-11 ENCOUNTER — Other Ambulatory Visit: Payer: Self-pay

## 2017-12-11 ENCOUNTER — Ambulatory Visit: Payer: Self-pay | Admitting: Physician Assistant

## 2017-12-11 ENCOUNTER — Encounter: Payer: Self-pay | Admitting: Physician Assistant

## 2017-12-11 VITALS — BP 118/84 | HR 79 | Temp 97.7°F | Resp 16 | Ht 69.0 in | Wt 168.0 lb

## 2017-12-11 DIAGNOSIS — Z0289 Encounter for other administrative examinations: Secondary | ICD-10-CM

## 2017-12-11 NOTE — Patient Instructions (Signed)
     IF you received an x-ray today, you will receive an invoice from [facility]. Please contact [facility] at [phone] with questions or concerns regarding your invoice.   IF you received labwork today, you will receive an invoice from [facility]. Please contact [facility] at [phone] with questions or concerns regarding your invoice.   Our billing staff will not be able to assist you with questions regarding bills from these companies.  You will be contacted with the lab results as soon as they are available. The fastest way to get your results is to activate your My Chart account. Instructions are located on the last page of this paperwork. If you have not heard from us regarding the results in 2 weeks, please contact this office.     

## 2017-12-20 NOTE — Progress Notes (Signed)
PRIMARY CARE AT [place], [place] [address] [phone]  Date:  12/11/2017   Name:  Jerome Cruz   DOB:  08-06-1956   MRN:  [id number]  PCP:  Colon Branch, MD    History of Present Illness:  Jerome Cruz is a 61 y.o. male patient who presents to PCP with  Chief Complaint  Patient presents with  . Annual Exam    DOT     No complaints or concerns.  Patient Active Problem List   Diagnosis Date Noted  . Foot pain 04/20/2012  . General medical examination 03/06/2012  . HYPERTENSION 09/27/2008  . GERD 09/27/2008    Past Medical History:  Diagnosis Date  . GERD (gastroesophageal reflux disease)   . Hypertension     Past Surgical History:  Procedure Laterality Date  . ANKLE SURGERY Right     Social History   Tobacco Use  . Smoking status: Former Smoker    Last attempt to quit: 08/24/1999    Years since quitting: 18.3  . Smokeless tobacco: Never Used  Substance Use Topics  . Alcohol use: No  . Drug use: No    History reviewed. No pertinent family history.  No Known Allergies  Medication list has been reviewed and updated.  Current Outpatient Medications on File Prior to Visit  Medication Sig Dispense Refill  . esomeprazole (NEXIUM) 20 MG capsule Take 20 mg by mouth daily at 12 noon.    Marland Kitchen omeprazole (PRILOSEC) 20 MG capsule Take 20 mg by mouth daily. Reported on 01/04/2016     No current facility-administered medications on file prior to visit.     ROS ROS otherwise unremarkable unless listed above.  Physical Examination: BP 118/84   Pulse 79   Temp 97.7 F (36.5 C) (Oral)   Resp 16   Ht 5\' 9"  (1.753 m)   Wt 168 lb (76.2 kg)   SpO2 97%   BMI 24.81 kg/m  Ideal Body Weight: Weight in (lb) to have BMI = 25: 168.9  Physical Exam  Constitutional: He is oriented to person, place, and time. He appears well-developed and well-nourished. No distress.  HENT:  Head: Normocephalic and atraumatic.  Right Ear: Tympanic membrane, external  ear and ear canal normal.  Left Ear: Tympanic membrane, external ear and ear canal normal.  Eyes: Conjunctivae and EOM are normal. Pupils are equal, round, and reactive to light.  Cardiovascular: Normal rate and regular rhythm. Exam reveals no friction rub.  No murmur heard. Pulmonary/Chest: Effort normal. No respiratory distress. He has no wheezes.  Abdominal: Soft. Bowel sounds are normal. He exhibits no distension and no mass. There is no tenderness. Hernia confirmed negative in the right inguinal area and confirmed negative in the left inguinal area.  Musculoskeletal: Normal range of motion. He exhibits no edema or tenderness.  Neurological: He is alert and oriented to person, place, and time. He displays normal reflexes.  Skin: Skin is warm and dry. He is not diaphoretic.  Psychiatric: He has a normal mood and affect. His behavior is normal.     Visual Acuity Screening   Right eye Left eye Both eyes  Without correction:     With correction: 20/15 20/15 20/15   Comments: Colors: pass 6/6  Titmus: pass 85  Hearing Screening Comments: Whisper: 75ft   Assessment and Plan: Jerome Cruz is a 61 y.o. male who is here today for  Chief Complaint  Patient presents with  . Annual Exam  DOT    Encounter for examination required by Department of Transportation (DOT)  Jerome Drape, PA-C [facility] 12/29/20187:42 PM

## 2017-12-29 DIAGNOSIS — R82998 Other abnormal findings in urine: Secondary | ICD-10-CM | POA: Diagnosis not present

## 2017-12-29 DIAGNOSIS — E7849 Other hyperlipidemia: Secondary | ICD-10-CM | POA: Diagnosis not present

## 2017-12-29 DIAGNOSIS — Z0000 Encounter for general adult medical examination without abnormal findings: Secondary | ICD-10-CM | POA: Diagnosis not present

## 2018-01-05 DIAGNOSIS — Z23 Encounter for immunization: Secondary | ICD-10-CM | POA: Diagnosis not present

## 2018-01-05 DIAGNOSIS — Z0000 Encounter for general adult medical examination without abnormal findings: Secondary | ICD-10-CM | POA: Diagnosis not present

## 2018-01-05 DIAGNOSIS — E7849 Other hyperlipidemia: Secondary | ICD-10-CM | POA: Diagnosis not present

## 2018-01-05 DIAGNOSIS — Z1212 Encounter for screening for malignant neoplasm of rectum: Secondary | ICD-10-CM | POA: Diagnosis not present

## 2018-01-05 DIAGNOSIS — R7301 Impaired fasting glucose: Secondary | ICD-10-CM | POA: Diagnosis not present

## 2018-02-12 DIAGNOSIS — J3489 Other specified disorders of nose and nasal sinuses: Secondary | ICD-10-CM | POA: Diagnosis not present

## 2018-02-12 DIAGNOSIS — Z6824 Body mass index (BMI) 24.0-24.9, adult: Secondary | ICD-10-CM | POA: Diagnosis not present

## 2018-03-04 ENCOUNTER — Ambulatory Visit: Payer: 59 | Admitting: Internal Medicine

## 2018-03-04 ENCOUNTER — Encounter: Payer: Self-pay | Admitting: Internal Medicine

## 2018-03-04 VITALS — BP 122/74 | HR 80 | Ht 68.75 in | Wt 166.2 lb

## 2018-03-04 DIAGNOSIS — R195 Other fecal abnormalities: Secondary | ICD-10-CM | POA: Diagnosis not present

## 2018-03-04 NOTE — Patient Instructions (Signed)
  You have been scheduled for a colonoscopy. Please follow written instructions given to you at your visit today.  Please pick up your prep supplies at the pharmacy. If you use inhalers (even only as needed), please bring them with you on the day of your procedure. Your physician has requested that you go to www.startemmi.com and enter the access code given to you at your visit today. This web site gives a general overview about your procedure. However, you should still follow specific instructions given to you by our office regarding your preparation for the procedure.     I appreciate the opportunity to care for you. Carl Gessner, MD, FACG 

## 2018-03-04 NOTE — Progress Notes (Signed)
The patient is here because of heme positive stool, heme assure positive.       Jerome Cruz 62 y.o. 04-02-1956 [id number] Referred by: Dr. [name]  Assessment & Plan:   Encounter Diagnosis  Name Primary?  . Heme + stool Yes   He should have a colonoscopy to investigate this immune fecal occult blood test positive stool.  He is concerned about deductibles and co-pays and I told him I understand but medically this could represent something serious though statistically not.  He will proceed to colonoscopy.The risks and benefits as well as alternatives of endoscopic procedure(s) have been discussed and reviewed. All questions answered. The patient agrees to proceed.  I appreciate the opportunity to care for this patient. CC: [name], MD   Subjective:   Chief Complaint: Heme positive stool  HPI The patient is here because of an asymptomatic heme positive stool.   Screening colonoscopy was negative in 2012.  CBC was normal in January also. No Known Allergies Current Meds  Medication Sig  . omeprazole (PRILOSEC) 20 MG capsule Take 20 mg by mouth daily. Reported on 01/04/2016  . sildenafil (VIAGRA) 100 MG tablet Take 100 mg by mouth daily as needed for erectile dysfunction.  . simvastatin (ZOCOR) 20 MG tablet Take 20 mg by mouth at bedtime.  . valsartan-hydrochlorothiazide (DIOVAN-HCT) 160-12.5 MG tablet Take 1 tablet by mouth daily.   Past Medical History:  Diagnosis Date  . ED (erectile dysfunction)   . GERD (gastroesophageal reflux disease)   . Hypertension    Past Surgical History:  Procedure Laterality Date  . ANKLE SURGERY Right   . COLONOSCOPY  2012   Social History   Social History Narrative   The patient is married with 1 son   He is a self-employed Psychiatrist, he also has livestock and greenhouses   No alcohol tobacco or drug use   family history includes Cancer in his father; Diabetes in his mother; Diabetes Mellitus I in his son;  Hypertension in his mother.   Review of Systems As per HPI otherwise negative Objective:   Physical Exam @BP  122/74 (BP Location: Left Arm, Patient Position: Sitting, Cuff Size: Normal)   Pulse 80   Ht 5' 8.75" (1.746 m) Comment: height measured without shoes  Wt 166 lb 4 oz (75.4 kg)   BMI 24.73 kg/m @  General:  NAD Lungs:  clear Heart::  S1S2 no rubs, murmurs or gallops Abdomen:  soft and nontender, BS+    Data Reviewed:   See HPI

## 2018-03-10 DIAGNOSIS — Z23 Encounter for immunization: Secondary | ICD-10-CM | POA: Diagnosis not present

## 2018-03-13 ENCOUNTER — Encounter: Payer: Self-pay | Admitting: Internal Medicine

## 2018-03-20 DIAGNOSIS — H1045 Other chronic allergic conjunctivitis: Secondary | ICD-10-CM | POA: Diagnosis not present

## 2018-03-27 ENCOUNTER — Encounter: Payer: Self-pay | Admitting: Internal Medicine

## 2018-03-27 ENCOUNTER — Other Ambulatory Visit: Payer: Self-pay

## 2018-03-27 ENCOUNTER — Ambulatory Visit (AMBULATORY_SURGERY_CENTER): Payer: 59 | Admitting: Internal Medicine

## 2018-03-27 VITALS — BP 104/72 | HR 57 | Temp 99.5°F | Resp 13 | Ht 68.0 in | Wt 166.0 lb

## 2018-03-27 DIAGNOSIS — R195 Other fecal abnormalities: Secondary | ICD-10-CM

## 2018-03-27 HISTORY — PX: COLONOSCOPY: SHX174

## 2018-03-27 MED ORDER — SODIUM CHLORIDE 0.9 % IV SOLN: 500.0000 mL | Freq: Once | INTRAVENOUS | Status: DC

## 2018-03-27 NOTE — Progress Notes (Signed)
Report to PACU, RN, vss, BBS= Clear.  

## 2018-03-27 NOTE — Patient Instructions (Addendum)
I found internal hemorrhoids and rare diverticulosis.  Diverticulosis - thickened muscle rings and pouches in the colon wall. Please read the handout about this condition.  I believe the hemorrhoids caused the + stool test. No polyps or cancer!  Next routine colonoscopy or other screening test in 10 years - 2029.  You do not need to do annual stool tests for the next 10 years.  I appreciate the opportunity to care for you. Gatha Mayer, MD, [facility]  Handouts given : Diverticulosis and Hemorrhoids.  YOU HAD AN ENDOSCOPIC PROCEDURE TODAY AT [facility]:   Refer to the procedure report that was given to you for any specific questions about what was found during the examination.  If the procedure report does not answer your questions, please call your gastroenterologist to clarify.  If you requested that your care partner not be given the details of your procedure findings, then the procedure report has been included in a sealed envelope for you to review at your convenience later.  YOU SHOULD EXPECT: Some feelings of bloating in the abdomen. Passage of more gas than usual.  Walking can help get rid of the air that was put into your GI tract during the procedure and reduce the bloating. If you had a lower endoscopy (such as a colonoscopy or flexible sigmoidoscopy) you may notice spotting of blood in your stool or on the toilet paper. If you underwent a bowel prep for your procedure, you may not have a normal bowel movement for a few days.  Please Note:  You might notice some irritation and congestion in your nose or some drainage.  This is from the oxygen used during your procedure.  There is no need for concern and it should clear up in a day or so.  SYMPTOMS TO REPORT IMMEDIATELY:   Following lower endoscopy (colonoscopy or flexible sigmoidoscopy):  Excessive amounts of blood in the stool  Significant tenderness or worsening of abdominal pains  Swelling of the  abdomen that is new, acute  Fever of 100F or higher   For urgent or emergent issues, a gastroenterologist can be reached at any hour by calling [phone].   DIET:  We do recommend a small meal at first, but then you may proceed to your regular diet.  Drink plenty of fluids but you should avoid alcoholic beverages for 24 hours.  ACTIVITY:  You should plan to take it easy for the rest of today and you should NOT DRIVE or use heavy machinery until tomorrow (because of the sedation medicines used during the test).    FOLLOW UP: Our staff will call the number listed on your records the next business day following your procedure to check on you and address any questions or concerns that you may have regarding the information given to you following your procedure. If we do not reach you, we will leave a message.  However, if you are feeling well and you are not experiencing any problems, there is no need to return our call.  We will assume that you have returned to your regular daily activities without incident.  If any biopsies were taken you will be contacted by phone or by letter within the next 1-3 weeks.  Please call us at [phone] if you have not heard about the biopsies in 3 weeks.    SIGNATURES/CONFIDENTIALITY: You and/or your care partner have signed paperwork which will be entered into your electronic medical record.  These signatures attest  to the fact that that the information above on your After Visit Summary has been reviewed and is understood.  Full responsibility of the confidentiality of this discharge information lies with you and/or your care-partner. 

## 2018-03-27 NOTE — Op Note (Signed)
[facility] Patient Name: Jerome Cruz Procedure Date: 03/27/2018 7:39 AM MRN: [id number] Endoscopist: Gatha Mayer , MD Age: 62 Referring MD:  Date of Birth: 03-Feb-1956 Gender: Male Account #: [id number] Procedure:                Colonoscopy Indications:              Positive fecal immunochemical test Medicines:                Propofol per Anesthesia, Monitored Anesthesia Care Procedure:                Pre-Anesthesia Assessment:                           - Prior to the procedure, a History and Physical                            was performed, and patient medications and                            allergies were reviewed. The patient's tolerance of                            previous anesthesia was also reviewed. The risks                            and benefits of the procedure and the sedation                            options and risks were discussed with the patient.                            All questions were answered, and informed consent                            was obtained. Prior Anticoagulants: The patient has                            taken no previous anticoagulant or antiplatelet                            agents. ASA Grade Assessment: II - A patient with                            mild systemic disease. After reviewing the risks                            and benefits, the patient was deemed in                            satisfactory condition to undergo the procedure.                           After obtaining informed consent, the colonoscope  was passed under direct vision. Throughout the                            procedure, the patient's blood pressure, pulse, and                            oxygen saturations were monitored continuously. The                            Colonoscope was introduced through the anus and                            advanced to the the cecum, identified by                            appendiceal  orifice and ileocecal valve. The                            colonoscopy was performed without difficulty. The                            patient tolerated the procedure well. The quality                            of the bowel preparation was excellent. The                            ileocecal valve, appendiceal orifice, and rectum                            were photographed. The bowel preparation used was                            Miralax. Scope In: 7:44:37 AM Scope Out: 7:54:44 AM Scope Withdrawal Time: 0 hours 7 minutes 56 seconds  Total Procedure Duration: 0 hours 10 minutes 7 seconds  Findings:                 The perianal and digital rectal examinations were                            normal. Pertinent negatives include normal prostate                            (size, shape, and consistency).                           Internal hemorrhoids were found.                           A few small-mouthed diverticula were found in the                            sigmoid colon.  The exam was otherwise without abnormality on                            direct and retroflexion views. Complications:            No immediate complications. Estimated Blood Loss:     Estimated blood loss: none. Impression:               - Internal hemorrhoids.                           - Diverticulosis in the sigmoid colon.                           - The examination was otherwise normal on direct                            and retroflexion views.                           - No specimens collected. Recommendation:           - Patient has a contact number available for                            emergencies. The signs and symptoms of potential                            delayed complications were discussed with the                            patient. Return to normal activities tomorrow.                            Written discharge instructions were provided to the                             patient.                           - Resume previous diet.                           - Continue present medications.                           - Repeat colon cancer screening test in but not                            before 10 years. Gatha Mayer, MD 03/27/2018 8:02:40 AM This report has been signed electronically.

## 2018-03-30 ENCOUNTER — Telehealth: Payer: Self-pay

## 2018-03-30 NOTE — Telephone Encounter (Signed)
  Follow up Call-  Call Jerome Cruz number 03/27/2018  Post procedure Call Jerome Cruz phone  # [phone]  Permission to leave phone message Yes  Some recent data might be hidden     Patient questions:  Do you have a fever, pain , or abdominal swelling? No. Pain Score  0 *  Have you tolerated food without any problems? Yes.    Have you been able to return to your normal activities? Yes.    Do you have any questions about your discharge instructions: Diet   No. Medications  No. Follow up visit  No.  Do you have questions or concerns about your Care? No.  Actions: * If pain score is 4 or above: No action needed, pain <4.

## 2018-03-30 NOTE — Telephone Encounter (Signed)
Left message

## 2018-04-01 ENCOUNTER — Encounter: Payer: Self-pay | Admitting: Physician Assistant

## 2018-06-09 DIAGNOSIS — Z23 Encounter for immunization: Secondary | ICD-10-CM | POA: Diagnosis not present

## 2018-11-17 DIAGNOSIS — M25362 Other instability, left knee: Secondary | ICD-10-CM | POA: Diagnosis not present

## 2018-11-17 DIAGNOSIS — M25562 Pain in left knee: Secondary | ICD-10-CM | POA: Diagnosis not present

## 2019-01-27 DIAGNOSIS — E7849 Other hyperlipidemia: Secondary | ICD-10-CM | POA: Diagnosis not present

## 2019-01-27 DIAGNOSIS — Z0000 Encounter for general adult medical examination without abnormal findings: Secondary | ICD-10-CM | POA: Diagnosis not present

## 2019-01-27 DIAGNOSIS — R82998 Other abnormal findings in urine: Secondary | ICD-10-CM | POA: Diagnosis not present

## 2019-02-03 DIAGNOSIS — E7849 Other hyperlipidemia: Secondary | ICD-10-CM | POA: Diagnosis not present

## 2019-02-03 DIAGNOSIS — Z1212 Encounter for screening for malignant neoplasm of rectum: Secondary | ICD-10-CM | POA: Diagnosis not present

## 2019-02-03 DIAGNOSIS — M25512 Pain in left shoulder: Secondary | ICD-10-CM | POA: Diagnosis not present

## 2019-02-03 DIAGNOSIS — Z0000 Encounter for general adult medical examination without abnormal findings: Secondary | ICD-10-CM | POA: Diagnosis not present

## 2019-02-03 DIAGNOSIS — Z23 Encounter for immunization: Secondary | ICD-10-CM | POA: Diagnosis not present

## 2019-02-03 DIAGNOSIS — R7301 Impaired fasting glucose: Secondary | ICD-10-CM | POA: Diagnosis not present

## 2019-02-18 DIAGNOSIS — L821 Other seborrheic keratosis: Secondary | ICD-10-CM | POA: Diagnosis not present

## 2019-02-18 DIAGNOSIS — L814 Other melanin hyperpigmentation: Secondary | ICD-10-CM | POA: Diagnosis not present

## 2019-02-18 DIAGNOSIS — L570 Actinic keratosis: Secondary | ICD-10-CM | POA: Diagnosis not present

## 2020-01-20 ENCOUNTER — Other Ambulatory Visit: Payer: Self-pay

## 2020-01-20 ENCOUNTER — Encounter: Payer: Self-pay | Admitting: Family Medicine

## 2020-01-20 ENCOUNTER — Ambulatory Visit (INDEPENDENT_AMBULATORY_CARE_PROVIDER_SITE_OTHER): Payer: Self-pay | Admitting: Family Medicine

## 2020-01-20 VITALS — BP 136/82 | HR 77 | Temp 98.8°F | Ht 68.0 in | Wt 167.0 lb

## 2020-01-20 DIAGNOSIS — Z024 Encounter for examination for driving license: Secondary | ICD-10-CM

## 2020-01-20 NOTE — Patient Instructions (Addendum)
  1 year card for DOT. Keep routine follow up with primary provider.   If you have lab work done today you will be contacted with your lab results within the next 2 weeks.  If you have not heard from [place] then please contact us. The fastest way to get your results is to register for My Chart.   IF you received an x-ray today, you will receive an invoice from [facility]. Please contact [facility] at [phone] with questions or concerns regarding your invoice.   IF you received labwork today, you will receive an invoice from [facility]. Please contact [facility] at [phone] with questions or concerns regarding your invoice.   Our billing staff will not be able to assist you with questions regarding bills from these companies.  You will be contacted with the lab results as soon as they are available. The fastest way to get your results is to activate your My Chart account. Instructions are located on the last page of this paperwork. If you have not heard from [place] regarding the results in 2 weeks, please contact this office.

## 2020-01-20 NOTE — Progress Notes (Signed)
Subjective:  Patient ID: Jerome Cruz, male    DOB: 05-12-1956  Age: 64 y.o. MRN: [id number]  CC:  Chief Complaint  Patient presents with  . DOT    pt states he feels great with no complaints.    HPI Jerome Cruz presents for  DOT physical.  History of GERD, hypertension per problem list, last DOT physical here in 2018. 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   PCP: [name], MD  Hypertension: On Diovan HCT.  No known history of heart disease, no CP or dyspnea with activity.  No snoring, no hx of OSA, feels rested during the day.   BP Readings from Last 3 Encounters:  01/20/20 136/82  03/27/18 104/72  03/04/18 122/74   Lab Results  Component Value Date   CREATININE 1.1 03/06/2012   prilosec for GERD, now on nexium. No bleeding/PUD  No msk weakness or deficits.  No glaucoma or other known eye disorders.  Wears glasses to drive.   History Patient Active Problem List   Diagnosis Date Noted  . Foot pain 04/20/2012  . General medical examination 03/06/2012  . HYPERTENSION 09/27/2008  . GERD 09/27/2008   Past Medical History:  Diagnosis Date  . ED (erectile dysfunction)   . GERD (gastroesophageal reflux disease)   . Hyperlipidemia   . Hypertension    Past Surgical History:  Procedure Laterality Date  . ANKLE SURGERY Right   . COLONOSCOPY  2012   No Known Allergies Prior to Admission medications   Medication Sig Start Date End Date Taking? Authorizing Provider  amoxicillin (AMOXIL) 500 MG tablet  01/19/20  Yes [provider]  chlorhexidine (PERIDEX) 0.12 % solution SMARTSIG:0.5 Ounce(s) By Mouth Twice Daily 01/13/20  Yes [provider]  doxycycline (VIBRA-TABS) 100 MG tablet doxycycline hyclate 100 mg tablet   Yes [provider]  omeprazole (PRILOSEC) 20 MG capsule Take 20 mg by mouth daily. Reported on 01/04/2016   Yes [provider]  simvastatin (ZOCOR) 20 MG tablet Take  20 mg by mouth at bedtime.   Yes [provider]  valsartan-hydrochlorothiazide (DIOVAN-HCT) 160-12.5 MG tablet Take 1 tablet by mouth daily.   Yes [provider]  sildenafil (VIAGRA) 100 MG tablet Take 100 mg by mouth daily as needed for erectile dysfunction.    [provider]  Zoster Vaccine Adjuvanted [facility]) injection Shingrix (PF) 50 mcg/0.5 mL intramuscular suspension, kit  ADM 0.5ML IM UTD    [provider]   Social History   Socioeconomic History  . Marital status: Married    Spouse name: Not on file  . Number of children: 1  . Years of education: Not on file  . Highest education level: Not on file  Occupational History  . Occupation: self employed-grading / concrete  Tobacco Use  . Smoking status: Former Smoker    Packs/day: 1.00    Years: 15.00    Pack years: 15.00    Quit date: 08/23/2001    Years since quitting: 18.4  . Smokeless tobacco: Never Used  Substance and Sexual Activity  . Alcohol use: Yes    Comment: occ  . Drug use: No  . Sexual activity: Not on file  Other Topics Concern  . Not on file  Social History Narrative   The patient is married with 1 son   He is a self-employed Psychiatrist, he also has livestock and greenhouses   No alcohol tobacco or drug  use   Social Determinants of Health   Financial Resource Strain:   . Difficulty of Paying Living Expenses: Not on file  Food Insecurity:   . Worried About Charity fundraiser in the Last Year: Not on file  . Ran Out of Food in the Last Year: Not on file  Transportation Needs:   . Lack of Transportation (Medical): Not on file  . Lack of Transportation (Non-Medical): Not on file  Physical Activity:   . Days of Exercise per Week: Not on file  . Minutes of Exercise per Session: Not on file  Stress:   . Feeling of Stress : Not on file  Social Connections:   . Frequency of Communication with Friends and Family: Not on file  . Frequency of Social  Gatherings with Friends and Family: Not on file  . Attends Religious Services: Not on file  . Active Member of Clubs or Organizations: Not on file  . Attends Archivist Meetings: Not on file  . Marital Status: Not on file  Intimate Partner Violence:   . Fear of Current or Ex-Partner: Not on file  . Emotionally Abused: Not on file  . Physically Abused: Not on file  . Sexually Abused: Not on file    Review of Systems   review of systems per driver survey noted.  Negative other than as indicated above.    Objective:   Vitals:   01/20/20 1445  BP: 136/82  Pulse: 77  Temp: 98.8 F (37.1 C)  TempSrc: Temporal  SpO2: 97%  Weight: 167 lb (75.8 kg)  Height: 5' 8"  (1.727 m)    Physical Exam Vitals reviewed.  Constitutional:      Appearance: He is well-developed.  HENT:     Head: Normocephalic and atraumatic.  Eyes:     Pupils: Pupils are equal, round, and reactive to light.  Neck:     Vascular: No carotid bruit or JVD.  Cardiovascular:     Rate and Rhythm: Normal rate and regular rhythm.     Heart sounds: Normal heart sounds. No murmur.  Pulmonary:     Effort: Pulmonary effort is normal.     Breath sounds: Normal breath sounds. No rales.  Abdominal:     Hernia: There is no hernia in the left inguinal area or right inguinal area.  Skin:    General: Skin is warm and dry.  Neurological:     Mental Status: He is alert and oriented to person, place, and time.  Psychiatric:        Mood and Affect: Mood normal.        Behavior: Behavior normal.     Assessment & Plan:  Jerome Cruz is a 64 y.o. male . Encounter for commercial driver medical examination (CDME)   - 1 year card - HTN. See dot paperwork, no concerns on exam/history.   No orders of the defined types were placed in this encounter.  Patient Instructions    1 year card for DOT. Keep routine follow up with primary provider.   If you have lab work done today you will be contacted with your lab  results within the next 2 weeks.  If you have not heard from [place] then please contact us. The fastest way to get your results is to register for My Chart.   IF you received an x-ray today, you will receive an invoice from [facility]. Please contact [facility] at [phone] with questions or concerns regarding your invoice.  IF you received labwork today, you will receive an invoice from [facility]. Please contact [facility] at [phone] with questions or concerns regarding your invoice.   Our billing staff will not be able to assist you with questions regarding bills from these companies.  You will be contacted with the lab results as soon as they are available. The fastest way to get your results is to activate your My Chart account. Instructions are located on the last page of this paperwork. If you have not heard from [place] regarding the results in 2 weeks, please contact this office.         Signed, Merri Ray, MD [facility]

## 2020-03-27 ENCOUNTER — Other Ambulatory Visit: Payer: Self-pay | Admitting: Internal Medicine

## 2020-03-27 DIAGNOSIS — E785 Hyperlipidemia, unspecified: Secondary | ICD-10-CM

## 2020-06-22 ENCOUNTER — Other Ambulatory Visit: Payer: Self-pay | Admitting: Internal Medicine

## 2020-06-22 DIAGNOSIS — E785 Hyperlipidemia, unspecified: Secondary | ICD-10-CM

## 2020-06-27 ENCOUNTER — Ambulatory Visit
Admission: RE | Admit: 2020-06-27 | Discharge: 2020-06-27 | Disposition: A | Discharge status: Home / Self Care | Payer: 59 | Source: Ambulatory Visit | Attending: Internal Medicine | Admitting: Internal Medicine

## 2020-06-27 DIAGNOSIS — E785 Hyperlipidemia, unspecified: Secondary | ICD-10-CM

## 2020-08-02 ENCOUNTER — Encounter (HOSPITAL_BASED_OUTPATIENT_CLINIC_OR_DEPARTMENT_OTHER): Payer: Self-pay | Admitting: Emergency Medicine

## 2020-08-02 ENCOUNTER — Other Ambulatory Visit: Payer: Self-pay

## 2020-08-02 ENCOUNTER — Emergency Department (HOSPITAL_BASED_OUTPATIENT_CLINIC_OR_DEPARTMENT_OTHER)
Admission: EM | Admit: 2020-08-02 | Discharge: 2020-08-02 | Disposition: A | Discharge status: Home / Self Care | Payer: 59 | Attending: Emergency Medicine | Admitting: Emergency Medicine

## 2020-08-02 ENCOUNTER — Emergency Department (HOSPITAL_BASED_OUTPATIENT_CLINIC_OR_DEPARTMENT_OTHER): Payer: 59

## 2020-08-02 DIAGNOSIS — Z87891 Personal history of nicotine dependence: Secondary | ICD-10-CM | POA: Diagnosis not present

## 2020-08-02 DIAGNOSIS — R55 Syncope and collapse: Secondary | ICD-10-CM | POA: Diagnosis present

## 2020-08-02 DIAGNOSIS — Z79899 Other long term (current) drug therapy: Secondary | ICD-10-CM | POA: Insufficient documentation

## 2020-08-02 DIAGNOSIS — I10 Essential (primary) hypertension: Secondary | ICD-10-CM | POA: Diagnosis not present

## 2020-08-02 LAB — URINALYSIS, ROUTINE W REFLEX MICROSCOPIC
Bilirubin Urine: NEGATIVE
Glucose, UA: NEGATIVE mg/dL
Ketones, ur: NEGATIVE mg/dL
Leukocytes,Ua: NEGATIVE
Nitrite: NEGATIVE
Protein, ur: NEGATIVE mg/dL
Specific Gravity, Urine: 1.015 (ref 1.005–1.030)
pH: 6 (ref 5.0–8.0)

## 2020-08-02 LAB — URINALYSIS, MICROSCOPIC (REFLEX)

## 2020-08-02 LAB — CBC
HCT: 43.5 % (ref 39.0–52.0)
Hemoglobin: 15.4 g/dL (ref 13.0–17.0)
MCH: 32.6 pg (ref 26.0–34.0)
MCHC: 35.4 g/dL (ref 30.0–36.0)
MCV: 92 fL (ref 80.0–100.0)
Platelets: 227 10*3/uL (ref 150–400)
RBC: 4.73 MIL/uL (ref 4.22–5.81)
RDW: 12.9 % (ref 11.5–15.5)
WBC: 7.4 10*3/uL (ref 4.0–10.5)
nRBC: 0 % (ref 0.0–0.2)

## 2020-08-02 LAB — BASIC METABOLIC PANEL
Anion gap: 13 (ref 5–15)
BUN: 21 mg/dL (ref 8–23)
CO2: 23 mmol/L (ref 22–32)
Calcium: 9.4 mg/dL (ref 8.9–10.3)
Chloride: 100 mmol/L (ref 98–111)
Creatinine, Ser: 1.03 mg/dL (ref 0.61–1.24)
GFR calc Af Amer: >60 mL/min (ref >60)
GFR calc non Af Amer: >60 mL/min (ref >60)
Glucose, Bld: 119 mg/dL — ABNORMAL HIGH (ref 70–99)
Potassium: 3.4 mmol/L — ABNORMAL LOW (ref 3.5–5.1)
Sodium: 136 mmol/L (ref 135–145)

## 2020-08-02 LAB — HEPATIC FUNCTION PANEL
ALT: 31 U/L (ref 0–44)
AST: 27 U/L (ref 15–41)
Albumin: 4.5 g/dL (ref 3.5–5.0)
Alkaline Phosphatase: 41 U/L (ref 38–126)
Bilirubin, Direct: 0.1 mg/dL (ref 0.0–0.2)
Indirect Bilirubin: 0.6 mg/dL (ref 0.3–0.9)
Total Bilirubin: 0.7 mg/dL (ref 0.3–1.2)
Total Protein: 7.5 g/dL (ref 6.5–8.1)

## 2020-08-02 LAB — TROPONIN I (HIGH SENSITIVITY): Troponin I (High Sensitivity): 6 ng/L (ref <18)

## 2020-08-02 LAB — CBG MONITORING, ED: Glucose-Capillary: 119 mg/dL — ABNORMAL HIGH (ref 70–99)

## 2020-08-02 MED ORDER — SODIUM CHLORIDE 0.9 % IV SOLN: INTRAVENOUS | Status: DC

## 2020-08-02 MED ORDER — VALSARTAN 160 MG PO TABS
160.0000 mg | ORAL_TABLET | Freq: Every day | ORAL | 0 refills | Status: DC
Start: 2020-08-02 — End: 2021-09-26

## 2020-08-02 MED ORDER — SODIUM CHLORIDE 0.9 % IV BOLUS
1000.0000 mL | Freq: Once | INTRAVENOUS | Status: AC
  Administered 2020-08-02: 1000 mL via INTRAVENOUS

## 2020-08-02 NOTE — ED Triage Notes (Signed)
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

## 2020-08-02 NOTE — Discharge Instructions (Signed)
Stop diovan-hctz and start diovan.

## 2020-08-02 NOTE — ED Provider Notes (Signed)
[facility] EMERGENCY DEPARTMENT Provider Note   CSN: [id number] Arrival date & time: 08/02/20  1019     History Chief Complaint  Patient presents with   Loss of Consciousness    Jerome Cruz is a 64 y.o. male.  Pt presents to the ED today with a syncopal event.  Pt works outside in [employer].  He said he drinks a lot of fluid while outside.  Last night, after eating dinner, he stood up, felt dizzy, and passed out.  He said it was only for a few seconds.  He feels fine today.  He called his pcp and was told to come to the ED.        Past Medical History:  Diagnosis Date   ED (erectile dysfunction)    GERD (gastroesophageal reflux disease)    Hyperlipidemia    Hypertension     Patient Active Problem List   Diagnosis Date Noted   Foot pain 04/20/2012   General medical examination 03/06/2012   HYPERTENSION 09/27/2008   GERD 09/27/2008    Past Surgical History:  Procedure Laterality Date   ANKLE SURGERY Right    COLONOSCOPY  2012       Family History  Problem Relation Age of Onset   Diabetes Mother    Hypertension Mother    Cancer Father        type unknown   Diabetes Mellitus I Son        since age 32   Colon cancer Neg Hx    Rectal cancer Neg Hx    Stomach cancer Neg Hx    Esophageal cancer Neg Hx     Social History   Tobacco Use   Smoking status: Former Smoker    Packs/day: 1.00    Years: 15.00    Pack years: 15.00    Quit date: 08/23/2001    Years since quitting: 18.9   Smokeless tobacco: Never Used  Vaping Use   Vaping Use: Never used  Substance Use Topics   Alcohol use: Yes    Comment: occ   Drug use: No    Home Medications Prior to Admission medications   Medication Sig Start Date End Date Taking? Authorizing Provider  amoxicillin (AMOXIL) 500 MG tablet  01/19/20   [provider]  chlorhexidine (PERIDEX) 0.12 % solution SMARTSIG:0.5 Ounce(s) By Mouth Twice Daily 01/13/20   [provider]  doxycycline (VIBRA-TABS) 100 MG tablet doxycycline hyclate 100 mg tablet    [provider]  omeprazole (PRILOSEC) 20 MG capsule Take 20 mg by mouth daily. Reported on 01/04/2016    [provider]  sildenafil (VIAGRA) 100 MG tablet Take 100 mg by mouth daily as needed for erectile dysfunction.    [provider]  simvastatin (ZOCOR) 20 MG tablet Take 20 mg by mouth at bedtime.    [provider]  valsartan (DIOVAN) 160 MG tablet Take 1 tablet (160 mg total) by mouth daily. 08/02/20   Isla Pence, MD  Zoster Vaccine Adjuvanted [facility]) injection Shingrix (PF) 50 mcg/0.5 mL intramuscular suspension, kit  ADM 0.5ML IM UTD    [provider]    Allergies    Patient has no known allergies.  Review of Systems   Review of Systems  Neurological: Positive for syncope.  All other systems reviewed and are negative.   Physical Exam Updated Vital Signs BP 131/81    Pulse 65    Temp 98.8 F (37.1 C)    Resp 15  SpO2 97%   Physical Exam Vitals and nursing note reviewed.  Constitutional:      Appearance: Normal appearance.  HENT:     Head: Normocephalic and atraumatic.     Right Ear: External ear normal.     Left Ear: External ear normal.     Nose: Nose normal.     Mouth/Throat:     Mouth: Mucous membranes are moist.     Pharynx: Oropharynx is clear.  Eyes:     Extraocular Movements: Extraocular movements intact.     Conjunctiva/sclera: Conjunctivae normal.     Pupils: Pupils are equal, round, and reactive to light.  Cardiovascular:     Rate and Rhythm: Normal rate and regular rhythm.     Pulses: Normal pulses.     Heart sounds: Normal heart sounds.  Pulmonary:     Effort: Pulmonary effort is normal.     Breath sounds: Normal breath sounds.  Abdominal:     General: Abdomen is flat. Bowel sounds are normal.     Palpations: Abdomen is soft.  Musculoskeletal:        General: Normal range of motion.     Cervical back:  Normal range of motion and neck supple.  Skin:    General: Skin is warm.     Capillary Refill: Capillary refill takes less than 2 seconds.  Neurological:     General: No focal deficit present.     Mental Status: He is alert and oriented to person, place, and time.  Psychiatric:        Mood and Affect: Mood normal.        Behavior: Behavior normal.        Thought Content: Thought content normal.        Judgment: Judgment normal.     ED Results / Procedures / Treatments   Labs (all labs ordered are listed, but only abnormal results are displayed) Labs Reviewed  URINALYSIS, ROUTINE W REFLEX MICROSCOPIC - Abnormal; Notable for the following components:      Result Value   Hgb urine dipstick TRACE (*)    All other components within normal limits  BASIC METABOLIC PANEL - Abnormal; Notable for the following components:   Potassium 3.4 (*)    Glucose, Bld 119 (*)    All other components within normal limits  URINALYSIS, MICROSCOPIC (REFLEX) - Abnormal; Notable for the following components:   Bacteria, UA RARE (*)    All other components within normal limits  CBG MONITORING, ED - Abnormal; Notable for the following components:   Glucose-Capillary 119 (*)    All other components within normal limits  CBC  HEPATIC FUNCTION PANEL  TROPONIN I (HIGH SENSITIVITY)    EKG EKG Interpretation  Date/Time:  Wednesday August 02 2020 10:27:47 EDT Ventricular Rate:  96 PR Interval:  132 QRS Duration: 92 QT Interval:  350 QTC Calculation: 442 R Axis:   65 Text Interpretation: Normal sinus rhythm Normal ECG No old tracing to compare Confirmed by Isla Pence [phone]) on 08/02/2020 10:43:19 AM   Radiology DG Chest 1 View  Result Date: 08/02/2020 CLINICAL DATA:  Syncope EXAM: CHEST  1 VIEW COMPARISON:  June 27, 2020. FINDINGS: The cardiomediastinal silhouette is normal in contour. Apical pleuroparenchymal scarring. RIGHT chest calcified granuloma. No pleural effusion. No pneumothorax. No  acute pleuroparenchymal abnormality. Visualized abdomen is unremarkable. No acute osseous abnormality noted. IMPRESSION: No acute cardiopulmonary abnormality. No acute cardiopulmonary process. Electronically Signed   By: Valentino Saxon MD   On: 08/02/2020  11:15    Procedures Procedures (including critical care time)  Medications Ordered in ED Medications  sodium chloride 0.9 % bolus 1,000 mL ( Intravenous Stopped 08/02/20 1222)    And  0.9 %  sodium chloride infusion (has no administration in time range)    ED Course  I have reviewed the triage vital signs and the nursing notes.  Pertinent labs & imaging results that were available during my care of the patient were reviewed by me and considered in my medical decision making (see chart for details).    MDM Rules/Calculators/A&P                          Pt feels well.  Work up is reassuring.  He is on valsartan-hctz.  I will change this to plain valsartan.  Pt is instructed to drink lots of fluids while outside in the heat.  He is instructed to return if worse.  F/u with pcp.   Final Clinical Impression(s) / ED Diagnoses Final diagnoses:  Syncope and collapse    Rx / DC Orders ED Discharge Orders         Ordered    valsartan (DIOVAN) 160 MG tablet  Daily     Discontinue  Reprint     08/02/20 1402           Isla Pence, MD 08/02/20 1445

## 2021-03-09 DIAGNOSIS — H524 Presbyopia: Secondary | ICD-10-CM | POA: Diagnosis not present

## 2021-03-09 DIAGNOSIS — H1789 Other corneal scars and opacities: Secondary | ICD-10-CM | POA: Diagnosis not present

## 2021-03-09 DIAGNOSIS — H2513 Age-related nuclear cataract, bilateral: Secondary | ICD-10-CM | POA: Diagnosis not present

## 2021-03-22 DIAGNOSIS — R7301 Impaired fasting glucose: Secondary | ICD-10-CM | POA: Diagnosis not present

## 2021-03-22 DIAGNOSIS — E785 Hyperlipidemia, unspecified: Secondary | ICD-10-CM | POA: Diagnosis not present

## 2021-03-22 DIAGNOSIS — Z125 Encounter for screening for malignant neoplasm of prostate: Secondary | ICD-10-CM | POA: Diagnosis not present

## 2021-03-22 DIAGNOSIS — Z0000 Encounter for general adult medical examination without abnormal findings: Secondary | ICD-10-CM | POA: Diagnosis not present

## 2021-03-30 DIAGNOSIS — R82998 Other abnormal findings in urine: Secondary | ICD-10-CM | POA: Diagnosis not present

## 2021-03-30 DIAGNOSIS — I10 Essential (primary) hypertension: Secondary | ICD-10-CM | POA: Diagnosis not present

## 2021-03-30 DIAGNOSIS — Z0000 Encounter for general adult medical examination without abnormal findings: Secondary | ICD-10-CM | POA: Diagnosis not present

## 2021-03-30 DIAGNOSIS — Z1331 Encounter for screening for depression: Secondary | ICD-10-CM | POA: Diagnosis not present

## 2021-03-30 DIAGNOSIS — E785 Hyperlipidemia, unspecified: Secondary | ICD-10-CM | POA: Diagnosis not present

## 2021-09-03 DIAGNOSIS — R319 Hematuria, unspecified: Secondary | ICD-10-CM | POA: Diagnosis not present

## 2021-09-03 DIAGNOSIS — R300 Dysuria: Secondary | ICD-10-CM | POA: Diagnosis not present

## 2021-09-20 DIAGNOSIS — R310 Gross hematuria: Secondary | ICD-10-CM | POA: Diagnosis not present

## 2021-09-20 DIAGNOSIS — D414 Neoplasm of uncertain behavior of bladder: Secondary | ICD-10-CM | POA: Diagnosis not present

## 2021-09-21 ENCOUNTER — Other Ambulatory Visit: Payer: Self-pay | Admitting: Urology

## 2021-09-21 MED ORDER — GEMCITABINE CHEMO FOR BLADDER INSTILLATION 2000 MG
2000.0000 mg | Freq: Once | INTRAVENOUS | Status: DC
Start: 2021-09-21 — End: 2022-07-08

## 2021-09-26 ENCOUNTER — Other Ambulatory Visit: Payer: Self-pay

## 2021-09-26 ENCOUNTER — Encounter (HOSPITAL_BASED_OUTPATIENT_CLINIC_OR_DEPARTMENT_OTHER): Payer: Self-pay | Admitting: Urology

## 2021-09-26 DIAGNOSIS — D494 Neoplasm of unspecified behavior of bladder: Secondary | ICD-10-CM

## 2021-09-26 HISTORY — DX: Neoplasm of unspecified behavior of bladder: D49.4

## 2021-09-26 NOTE — Progress Notes (Signed)
Spoke w/ via phone for pre-op interview---patient Lab needs dos---- ISTAT, EKG              Lab results------none COVID test -----patient states asymptomatic no test needed Arrive at -------1245 NPO after MN NO Solid Food.  Clear liquids from MN until---1145 Med rec completed Medications to take morning of surgery -----Nexium Diabetic medication ----- n/a Patient instructed no nail polish to be worn day of surgery Patient instructed to bring photo id and insurance card day of surgery Patient aware to have Driver (ride ) / caregiver    for 24 hours after surgery - wife Jerome Cruz Patient Special Instructions -----none Pre-Op special Istructions -----none Patient verbalized understanding of instructions that were given at this phone interview. Patient denies shortness of breath, chest pain, fever, cough at this phone interview.

## 2021-10-01 NOTE — H&P (Signed)
CC/HPI: cc: gross hematuria   09/20/21: Jerome Cruz is a 65 year old man referred for 2-3 week history of intermittent painless gross hematuria. He has no smoking history. He is self-employed and operates machinery daily. He has never passed a kidney stone. He does not think he has a family history of bladder cancer but his dad did have bladder issues. He has had 2 weeks of left groin pain that may worsen after riding a tractor. PVR 159 cc. IPSS: 1, 2, 4, 0, 4, 2, 2=15/35  2/6 mostly satified QOL     ALLERGIES: None   MEDICATIONS: Simvastatin 20 mg tablet 1 tablet PO Daily  Valsartan 80 mg tablet 1 tablet PO Daily     GU PSH: None   NON-GU PSH: Ankle Arthroscopy/surgery     GU PMH: Other microscopic hematuria    NON-GU PMH: None   FAMILY HISTORY: Hematuria - Father Type 2 Diabetes - Mother, Son   SOCIAL HISTORY: Marital Status: Married Current Smoking Status: Patient has never smoked.   Tobacco Use Assessment Completed: Used Tobacco in last 30 days? Social Drinker.  Does not use drugs. Drinks 1 caffeinated drink per day.    REVIEW OF SYSTEMS:    GU Review Male:   Patient denies frequent urination, hard to postpone urination, burning/ pain with urination, get up at night to urinate, leakage of urine, stream starts and stops, trouble starting your stream, have to strain to urinate , erection problems, and penile pain.  Gastrointestinal (Upper):   Patient denies nausea, vomiting, and indigestion/ heartburn.  Gastrointestinal (Lower):   Patient denies constipation and diarrhea.  Constitutional:   Patient denies fever, night sweats, weight loss, and fatigue.  Skin:   Patient denies skin rash/ lesion and itching.  Eyes:   Patient denies blurred vision and double vision.  Ears/ Nose/ Throat:   Patient denies sore throat and sinus problems.  Hematologic/Lymphatic:   Patient denies swollen glands and easy bruising.  Cardiovascular:   Patient denies leg swelling and chest pains.   Respiratory:   Patient denies cough and shortness of breath.  Endocrine:   Patient denies excessive thirst.  Musculoskeletal:   Patient denies back pain and joint pain.  Neurological:   Patient denies headaches and dizziness.  Psychologic:   Patient denies depression and anxiety.   VITAL SIGNS: None   GU PHYSICAL EXAMINATION:    Scrotum: No lesions. No edema. No cysts. No warts.  Epididymides: Right: no spermatocele, no masses, no cysts, no tenderness, no induration, no enlargement. Left: no spermatocele, no masses, no cysts, no tenderness, no induration, no enlargement.  Testes: No tenderness, no swelling, no enlargement left testes. No tenderness, no swelling, no enlargement right testes. Normal location left testes. Normal location right testes. No mass, no cyst, no varicocele, no hydrocele left testes. No mass, no cyst, no varicocele, no hydrocele right testes.  Urethral Meatus: Normal size. No lesion, no wart, no discharge, no polyp. Normal location.  Penis: Circumcised, no warts, no cracks. No dorsal Peyronie's plaques, no left corporal Peyronie's plaques, no right corporal Peyronie's plaques, no scarring, no warts. No balanitis, no meatal stenosis.   MULTI-SYSTEM PHYSICAL EXAMINATION:    Constitutional: Well-nourished. No physical deformities. Normally developed. Good grooming.  Neck: Neck symmetrical, not swollen. Normal tracheal position.  Respiratory: No labored breathing, no use of accessory muscles.   Skin: No paleness, no jaundice, no cyanosis. No lesion, no ulcer, no rash.  Neurologic / Psychiatric: Oriented to time, oriented to place, oriented to person. No depression,  no anxiety, no agitation.  Gastrointestinal: No rigidity, non obese abdomen.   Eyes: Normal conjunctivae. Normal eyelids.  Ears, Nose, Mouth, and Throat: Left ear no scars, no lesions, no masses. Right ear no scars, no lesions, no masses. Nose no scars, no lesions, no masses. Normal hearing. Normal lips.   Musculoskeletal: Normal gait and station of head and neck.     Complexity of Data:  Records Review:   Previous Patient Records, POC Tool  Urine Test Review:   Urinalysis  Notes:                     Three/31/22: BUN 23, eGFR 75   PROCEDURES:         Flexible Cystoscopy - 52000  Risks, benefits, and some of the potential complications of the procedure were discussed at length with the patient including infection, bleeding, voiding discomfort, urinary retention, fever, chills, sepsis, and others. All questions were answered. Informed consent was obtained. Sterile technique and intraurethral analgesia were used.  Meatus:  Normal size. Normal location. Normal condition.  Urethra:  Mild bulbous stricture.  External Sphincter:  Normal.  Verumontanum:  Normal.  Prostate:  Borderline obstructing lateral lobes  Bladder Neck:  Non-obstructing.  Ureteral Orifices:  Did not clearly see due to poor visiblity - bladder tumors and blood  Bladder:  Multiple papillary tumors on posterior bladder wall with nodular bladder mass on right inferior/anterior bladder wall.      The lower urinary tract was carefully examined. The procedure was well-tolerated and without complications. Antibiotic instructions were given. Instructions were given to call the office immediately for bloody urine, difficulty urinating, urinary retention, painful or frequent urination, fever, chills, nausea, vomiting or other illness. The patient stated that he understood these instructions and would comply with them.        PVR Ultrasound - [id number]         Urinalysis w/Scope Dipstick Dipstick Cont'd Micro  Color: Yellow Bilirubin: Neg mg/dL WBC/hpf: 6 - 10/hpf  Appearance: Cloudy Ketones: Neg mg/dL RBC/hpf: 40 - 60/hpf  Specific Gravity: 1.025 Blood: 3+ ery/uL Bacteria: NS (Not Seen)  pH: <=5.0 Protein: Trace mg/dL Cystals: NS (Not Seen)  Glucose: Neg mg/dL Urobilinogen: 0.2 mg/dL Casts: NS (Not Seen)    Nitrites: Neg Trichomonas:  Not Present    Leukocyte Esterase: Neg leu/uL Mucous: Present      Epithelial Cells: 0 - 5/hpf      Yeast: NS (Not Seen)      Sperm: Not Present    ASSESSMENT:      ICD-10 Details  1 GU:   Gross hematuria - R31.0 Undiagnosed New Problem  2   Bladder tumor/neoplasm - D41.4 Undiagnosed New Problem   PLAN:           Document Letter(s):  Created for Patient: Clinical Summary         Notes:   Newly diagnosed bladder tumors that look concerning for bladder cancer. I discussed proceeding with trans urethral resection of bladder tumor and possibly intravesical gemcitabine. The risks and benefits of the procedure discussed the patient detail including bleeding, infection, pain, need for further catheter, bladder perforation/damage to surrounding structures, need for further treatment. Pt to be scheduled for surgery ASAP.

## 2021-10-02 ENCOUNTER — Ambulatory Visit (HOSPITAL_BASED_OUTPATIENT_CLINIC_OR_DEPARTMENT_OTHER)
Admission: RE | Admit: 2021-10-02 | Discharge: 2021-10-03 | Disposition: A | Discharge status: Home / Self Care | Payer: BC Managed Care – PPO | Source: Ambulatory Visit | Attending: Urology | Admitting: Urology

## 2021-10-02 ENCOUNTER — Encounter (HOSPITAL_BASED_OUTPATIENT_CLINIC_OR_DEPARTMENT_OTHER): Payer: Self-pay | Admitting: Urology

## 2021-10-02 ENCOUNTER — Encounter (HOSPITAL_BASED_OUTPATIENT_CLINIC_OR_DEPARTMENT_OTHER)
Admission: RE | Disposition: A | Discharge status: Home / Self Care | Payer: Self-pay | Source: Ambulatory Visit | Attending: Urology

## 2021-10-02 ENCOUNTER — Ambulatory Visit (HOSPITAL_BASED_OUTPATIENT_CLINIC_OR_DEPARTMENT_OTHER): Payer: BC Managed Care – PPO | Admitting: Certified Registered Nurse Anesthetist

## 2021-10-02 DIAGNOSIS — I10 Essential (primary) hypertension: Secondary | ICD-10-CM | POA: Diagnosis not present

## 2021-10-02 DIAGNOSIS — D494 Neoplasm of unspecified behavior of bladder: Secondary | ICD-10-CM | POA: Diagnosis not present

## 2021-10-02 DIAGNOSIS — C674 Malignant neoplasm of posterior wall of bladder: Secondary | ICD-10-CM | POA: Insufficient documentation

## 2021-10-02 DIAGNOSIS — Z87891 Personal history of nicotine dependence: Secondary | ICD-10-CM | POA: Diagnosis not present

## 2021-10-02 DIAGNOSIS — C679 Malignant neoplasm of bladder, unspecified: Secondary | ICD-10-CM | POA: Diagnosis not present

## 2021-10-02 DIAGNOSIS — N35912 Unspecified bulbous urethral stricture, male: Secondary | ICD-10-CM | POA: Insufficient documentation

## 2021-10-02 DIAGNOSIS — K219 Gastro-esophageal reflux disease without esophagitis: Secondary | ICD-10-CM | POA: Diagnosis not present

## 2021-10-02 HISTORY — PX: CYSTOSCOPY WITH URETHRAL DILATATION: SHX5125

## 2021-10-02 HISTORY — PX: TRANSURETHRAL RESECTION OF BLADDER TUMOR WITH MITOMYCIN-C: SHX6459

## 2021-10-02 LAB — POCT I-STAT, CHEM 8
BUN: 18 mg/dL (ref 8–23)
Calcium, Ion: 1.2 mmol/L (ref 1.15–1.40)
Chloride: 104 mmol/L (ref 98–111)
Creatinine, Ser: 0.9 mg/dL (ref 0.61–1.24)
Glucose, Bld: 93 mg/dL (ref 70–99)
HCT: 44 % (ref 39.0–52.0)
Hemoglobin: 15 g/dL (ref 13.0–17.0)
Potassium: 3.7 mmol/L (ref 3.5–5.1)
Sodium: 142 mmol/L (ref 135–145)
TCO2: 24 mmol/L (ref 22–32)

## 2021-10-02 SURGERY — TRANSURETHRAL RESECTION OF BLADDER TUMOR WITH MITOMYCIN-C
Anesthesia: General | Site: Urethra

## 2021-10-02 MED ORDER — LIDOCAINE HCL (CARDIAC) PF 100 MG/5ML IV SOSY
PREFILLED_SYRINGE | INTRAVENOUS | Status: DC | PRN
  Administered 2021-10-02: 50 mg via INTRAVENOUS

## 2021-10-02 MED ORDER — BELLADONNA ALKALOIDS-OPIUM 16.2-60 MG RE SUPP
RECTAL | Status: DC | PRN
  Administered 2021-10-02: 1 via RECTAL

## 2021-10-02 MED ORDER — OXYBUTYNIN CHLORIDE 5 MG PO TABS
ORAL_TABLET | ORAL | Status: AC
  Filled 2021-10-02: qty 1

## 2021-10-02 MED ORDER — LIDOCAINE 2% (20 MG/ML) 5 ML SYRINGE
INTRAMUSCULAR | Status: AC
  Filled 2021-10-02: qty 5

## 2021-10-02 MED ORDER — PHENYLEPHRINE HCL (PRESSORS) 10 MG/ML IV SOLN
INTRAVENOUS | Status: DC | PRN
  Administered 2021-10-02 (×4): 80 ug via INTRAVENOUS

## 2021-10-02 MED ORDER — ONDANSETRON HCL 4 MG/2ML IJ SOLN: 4.0000 mg | INTRAMUSCULAR | Status: DC | PRN

## 2021-10-02 MED ORDER — FENTANYL CITRATE (PF) 100 MCG/2ML IJ SOLN
INTRAMUSCULAR | Status: AC
  Filled 2021-10-02: qty 2

## 2021-10-02 MED ORDER — DIPHENHYDRAMINE HCL 50 MG/ML IJ SOLN
12.5000 mg | Freq: Four times a day (QID) | INTRAMUSCULAR | Status: DC | PRN
Start: 2021-10-02 — End: 2021-10-03

## 2021-10-02 MED ORDER — DEXAMETHASONE SODIUM PHOSPHATE 4 MG/ML IJ SOLN
INTRAMUSCULAR | Status: DC | PRN
  Administered 2021-10-02: 5 mg via INTRAVENOUS

## 2021-10-02 MED ORDER — DEXTROSE-NACL 5-0.45 % IV SOLN: INTRAVENOUS | Status: DC

## 2021-10-02 MED ORDER — BACITRACIN-NEOMYCIN-POLYMYXIN OINTMENT TUBE
TOPICAL_OINTMENT | CUTANEOUS | Status: AC
  Filled 2021-10-02: qty 14.17

## 2021-10-02 MED ORDER — OXYCODONE HCL 5 MG PO TABS: 5.0000 mg | ORAL_TABLET | Freq: Once | ORAL | Status: DC | PRN

## 2021-10-02 MED ORDER — DEXAMETHASONE SODIUM PHOSPHATE 10 MG/ML IJ SOLN
INTRAMUSCULAR | Status: AC
  Filled 2021-10-02: qty 1

## 2021-10-02 MED ORDER — MIDAZOLAM HCL 2 MG/2ML IJ SOLN
INTRAMUSCULAR | Status: AC
  Filled 2021-10-02: qty 2

## 2021-10-02 MED ORDER — SUGAMMADEX SODIUM 200 MG/2ML IV SOLN
INTRAVENOUS | Status: DC | PRN
  Administered 2021-10-02: 150 mg via INTRAVENOUS

## 2021-10-02 MED ORDER — ACETAMINOPHEN 160 MG/5ML PO SOLN
325.0000 mg | ORAL | Status: DC | PRN
Start: 2021-10-02 — End: 2021-10-03

## 2021-10-02 MED ORDER — CEFAZOLIN SODIUM-DEXTROSE 2-4 GM/100ML-% IV SOLN
2.0000 g | INTRAVENOUS | Status: AC
  Administered 2021-10-02: 2 g via INTRAVENOUS

## 2021-10-02 MED ORDER — ROCURONIUM BROMIDE 10 MG/ML (PF) SYRINGE
PREFILLED_SYRINGE | INTRAVENOUS | Status: AC
  Filled 2021-10-02: qty 10

## 2021-10-02 MED ORDER — ACETAMINOPHEN 325 MG PO TABS
325.0000 mg | ORAL_TABLET | ORAL | Status: DC | PRN
Start: 2021-10-02 — End: 2021-10-03

## 2021-10-02 MED ORDER — FENTANYL CITRATE (PF) 100 MCG/2ML IJ SOLN
25.0000 ug | INTRAMUSCULAR | Status: DC | PRN
  Administered 2021-10-02: 50 ug via INTRAVENOUS

## 2021-10-02 MED ORDER — OXYCODONE HCL 5 MG/5ML PO SOLN: 5.0000 mg | Freq: Once | ORAL | Status: DC | PRN

## 2021-10-02 MED ORDER — SIMVASTATIN 20 MG PO TABS
20.0000 mg | ORAL_TABLET | Freq: Every day | ORAL | Status: DC
  Administered 2021-10-02: 20 mg via ORAL
  Filled 2021-10-02: qty 1

## 2021-10-02 MED ORDER — PANTOPRAZOLE SODIUM 40 MG PO TBEC
40.0000 mg | DELAYED_RELEASE_TABLET | Freq: Every day | ORAL | Status: DC
  Administered 2021-10-02: 40 mg via ORAL

## 2021-10-02 MED ORDER — MORPHINE SULFATE (PF) 4 MG/ML IV SOLN: 2.0000 mg | INTRAVENOUS | Status: DC | PRN

## 2021-10-02 MED ORDER — BACITRACIN-NEOMYCIN-POLYMYXIN 400-5-5000 EX OINT
1.0000 "application " | TOPICAL_OINTMENT | Freq: Three times a day (TID) | CUTANEOUS | Status: DC | PRN
  Administered 2021-10-03: 1 via TOPICAL

## 2021-10-02 MED ORDER — PROMETHAZINE HCL 25 MG/ML IJ SOLN
6.2500 mg | INTRAMUSCULAR | Status: DC | PRN
Start: 2021-10-02 — End: 2021-10-03

## 2021-10-02 MED ORDER — ROCURONIUM BROMIDE 100 MG/10ML IV SOLN
INTRAVENOUS | Status: DC | PRN
  Administered 2021-10-02 (×2): 20 mg via INTRAVENOUS
  Administered 2021-10-02: 50 mg via INTRAVENOUS

## 2021-10-02 MED ORDER — LACTATED RINGERS IV SOLN: INTRAVENOUS | Status: DC

## 2021-10-02 MED ORDER — PROPOFOL 10 MG/ML IV BOLUS
INTRAVENOUS | Status: DC | PRN
  Administered 2021-10-02: 120 mg via INTRAVENOUS

## 2021-10-02 MED ORDER — ACETAMINOPHEN 10 MG/ML IV SOLN
INTRAVENOUS | Status: AC
  Filled 2021-10-02: qty 100

## 2021-10-02 MED ORDER — IOHEXOL 300 MG/ML  SOLN
INTRAMUSCULAR | Status: DC | PRN
  Administered 2021-10-02: 10 mL via URETHRAL

## 2021-10-02 MED ORDER — OXYCODONE HCL 5 MG PO TABS
5.0000 mg | ORAL_TABLET | ORAL | Status: DC | PRN
  Administered 2021-10-02: 5 mg via ORAL

## 2021-10-02 MED ORDER — CEFAZOLIN SODIUM-DEXTROSE 2-4 GM/100ML-% IV SOLN
INTRAVENOUS | Status: AC
  Filled 2021-10-02: qty 100

## 2021-10-02 MED ORDER — BELLADONNA ALKALOIDS-OPIUM 16.2-60 MG RE SUPP
RECTAL | Status: AC
  Filled 2021-10-02: qty 1

## 2021-10-02 MED ORDER — ACETAMINOPHEN 10 MG/ML IV SOLN
1000.0000 mg | Freq: Once | INTRAVENOUS | Status: DC | PRN
  Administered 2021-10-02: 1000 mg via INTRAVENOUS

## 2021-10-02 MED ORDER — FENTANYL CITRATE (PF) 100 MCG/2ML IJ SOLN
INTRAMUSCULAR | Status: DC | PRN
  Administered 2021-10-02 (×2): 50 ug via INTRAVENOUS

## 2021-10-02 MED ORDER — SODIUM CHLORIDE 0.9 % IR SOLN
Status: DC | PRN
  Administered 2021-10-02 (×4): 6000 mL

## 2021-10-02 MED ORDER — OXYBUTYNIN CHLORIDE 5 MG PO TABS
5.0000 mg | ORAL_TABLET | Freq: Three times a day (TID) | ORAL | Status: DC | PRN
  Administered 2021-10-02: 5 mg via ORAL

## 2021-10-02 MED ORDER — IRBESARTAN 75 MG PO TABS
75.0000 mg | ORAL_TABLET | Freq: Every day | ORAL | Status: DC
  Filled 2021-10-02: qty 1

## 2021-10-02 MED ORDER — SENNOSIDES-DOCUSATE SODIUM 8.6-50 MG PO TABS
2.0000 | ORAL_TABLET | Freq: Every day | ORAL | Status: DC
  Administered 2021-10-02: 2 via ORAL
  Filled 2021-10-02: qty 2

## 2021-10-02 MED ORDER — PANTOPRAZOLE SODIUM 40 MG PO TBEC
DELAYED_RELEASE_TABLET | ORAL | Status: AC
  Filled 2021-10-02: qty 1

## 2021-10-02 MED ORDER — ONDANSETRON HCL 4 MG/2ML IJ SOLN
INTRAMUSCULAR | Status: AC
  Filled 2021-10-02: qty 2

## 2021-10-02 MED ORDER — DIPHENHYDRAMINE HCL 12.5 MG/5ML PO ELIX: 12.5000 mg | ORAL_SOLUTION | Freq: Four times a day (QID) | ORAL | Status: DC | PRN

## 2021-10-02 MED ORDER — ACETAMINOPHEN 325 MG PO TABS
650.0000 mg | ORAL_TABLET | ORAL | Status: DC | PRN
  Administered 2021-10-02: 650 mg via ORAL

## 2021-10-02 MED ORDER — CEFAZOLIN SODIUM-DEXTROSE 1-4 GM/50ML-% IV SOLN
1.0000 g | Freq: Three times a day (TID) | INTRAVENOUS | Status: AC
Start: 2021-10-02 — End: 2021-10-03
  Administered 2021-10-03 (×2): 1 g via INTRAVENOUS

## 2021-10-02 MED ORDER — PROPOFOL 10 MG/ML IV BOLUS
INTRAVENOUS | Status: AC
  Filled 2021-10-02: qty 20

## 2021-10-02 MED ORDER — PHENYLEPHRINE 40 MCG/ML (10ML) SYRINGE FOR IV PUSH (FOR BLOOD PRESSURE SUPPORT)
PREFILLED_SYRINGE | INTRAVENOUS | Status: AC
  Filled 2021-10-02: qty 10

## 2021-10-02 MED ORDER — ONDANSETRON HCL 4 MG/2ML IJ SOLN
INTRAMUSCULAR | Status: DC | PRN
  Administered 2021-10-02: 4 mg via INTRAVENOUS

## 2021-10-02 MED ORDER — OXYCODONE HCL 5 MG PO TABS
ORAL_TABLET | ORAL | Status: AC
  Filled 2021-10-02: qty 1

## 2021-10-02 MED ORDER — ACETAMINOPHEN 325 MG PO TABS
ORAL_TABLET | ORAL | Status: AC
  Filled 2021-10-02: qty 2

## 2021-10-02 MED ORDER — AMISULPRIDE (ANTIEMETIC) 5 MG/2ML IV SOLN: 10.0000 mg | Freq: Once | INTRAVENOUS | Status: DC | PRN

## 2021-10-02 MED ORDER — MIDAZOLAM HCL 5 MG/5ML IJ SOLN
INTRAMUSCULAR | Status: DC | PRN
  Administered 2021-10-02: 2 mg via INTRAVENOUS

## 2021-10-02 SURGICAL SUPPLY — 27 items
BAG DRAIN URO-CYSTO SKYTR STRL (DRAIN) ×3 IMPLANT
BAG DRN RND TRDRP ANRFLXCHMBR (UROLOGICAL SUPPLIES) ×2
BAG DRN UROCATH (DRAIN) ×2
BAG URINE DRAIN 2000ML AR STRL (UROLOGICAL SUPPLIES) ×1 IMPLANT
BALLN NEPHROSTOMY (BALLOONS) ×3
BALLOON NEPHROSTOMY (BALLOONS) IMPLANT
CATH FOLEY 2WAY SLVR  5CC 18FR (CATHETERS)
CATH FOLEY 2WAY SLVR  5CC 20FR (CATHETERS) ×3
CATH FOLEY 2WAY SLVR 5CC 18FR (CATHETERS) IMPLANT
CATH FOLEY 2WAY SLVR 5CC 20FR (CATHETERS) IMPLANT
CLOTH BEACON ORANGE TIMEOUT ST (SAFETY) ×3 IMPLANT
ELECT REM PT RETURN 9FT ADLT (ELECTROSURGICAL)
ELECTRODE REM PT RTRN 9FT ADLT (ELECTROSURGICAL) ×2 IMPLANT
GLOVE SURG ENC MOIS LTX SZ6.5 (GLOVE) ×3 IMPLANT
GOWN STRL REUS W/TWL LRG LVL3 (GOWN DISPOSABLE) ×3 IMPLANT
GUIDEWIRE STR DUAL SENSOR (WIRE) ×1 IMPLANT
HOLDER FOLEY CATH W/STRAP (MISCELLANEOUS) ×1 IMPLANT
IV NS IRRIG 3000ML ARTHROMATIC (IV SOLUTION) ×11 IMPLANT
KIT TURNOVER CYSTO (KITS) ×3 IMPLANT
LOOP CUT BIPOLAR 24F LRG (ELECTROSURGICAL) ×1 IMPLANT
MANIFOLD NEPTUNE II (INSTRUMENTS) ×3 IMPLANT
NS IRRIG 500ML POUR BTL (IV SOLUTION) ×1 IMPLANT
PACK CYSTO (CUSTOM PROCEDURE TRAY) ×3 IMPLANT
SYR TOOMEY IRRIG 70ML (MISCELLANEOUS) ×3
SYRINGE TOOMEY IRRIG 70ML (MISCELLANEOUS) ×2 IMPLANT
TUBE CONNECTING 12X1/4 (SUCTIONS) ×3 IMPLANT
TUBING UROLOGY SET (TUBING) ×3 IMPLANT

## 2021-10-02 NOTE — Transfer of Care (Signed)
Immediate Anesthesia Transfer of Care Note  Patient: Jerome Cruz  Procedure(s) Performed: TRANSURETHRAL RESECTION OF BLADDER TUMOR WITH GEMCITABINE (Bladder) CYSTOSCOPY WITH URETHRAL BALLOON DILATATION (Urethra)  Patient Location: PACU  Anesthesia Type:General  Level of Consciousness: drowsy  Airway & Oxygen Therapy: Patient Spontanous Breathing and Patient connected to nasal cannula oxygen  Post-op Assessment: Report given to RN  Post vital signs: Reviewed and stable  Last Vitals:  Vitals Value Taken Time  BP 135/82 10/02/21 1548  Temp    Pulse 70 10/02/21 1550  Resp 12 10/02/21 1550  SpO2 97 % 10/02/21 1550  Vitals shown include unvalidated device data.  Last Pain:  Vitals:   10/02/21 1303  TempSrc: Oral  PainSc: 0-No pain      Patients Stated Pain Goal: 2 (43/01/48 4039)  Complications: No notable events documented.

## 2021-10-02 NOTE — Anesthesia Preprocedure Evaluation (Addendum)
Anesthesia Evaluation  Patient identified by MRN, date of birth, ID band Patient awake    Reviewed: Allergy & Precautions, NPO status , Patient's Chart, lab work & pertinent test results  Airway Mallampati: I  TM Distance: >3 FB Neck ROM: Full  Mouth opening: Limited Mouth Opening  Dental  (+) Dental Advisory Given, Teeth Intact, Caps   Pulmonary former smoker,    breath sounds clear to auscultation       Cardiovascular hypertension,  Rhythm:Regular Rate:Normal     Neuro/Psych negative neurological ROS  negative psych ROS   GI/Hepatic Neg liver ROS, GERD  ,  Endo/Other  negative endocrine ROS  Renal/GU negative Renal ROS     Musculoskeletal negative musculoskeletal ROS (+)   Abdominal Normal abdominal exam  (+)   Peds  Hematology negative hematology ROS (+)   Anesthesia Other Findings   Reproductive/Obstetrics                           Anesthesia Physical Anesthesia Plan  ASA: 3  Anesthesia Plan: General   Post-op Pain Management:    Induction: Intravenous  PONV Risk Score and Plan: 3 and Ondansetron, Midazolam and Dexamethasone  Airway Management Planned: Oral ETT  Additional Equipment:   Intra-op Plan:   Post-operative Plan: Extubation in OR  Informed Consent: I have reviewed the patients History and Physical, chart, labs and discussed the procedure including the risks, benefits and alternatives for the proposed anesthesia with the patient or authorized representative who has indicated his/her understanding and acceptance.     Dental advisory given  Plan Discussed with: CRNA  Anesthesia Plan Comments:        Anesthesia Quick Evaluation

## 2021-10-02 NOTE — Anesthesia Postprocedure Evaluation (Signed)
Anesthesia Post Note  Patient: DURANT SCIBILIA  Procedure(s) Performed: TRANSURETHRAL RESECTION OF BLADDER TUMOR WITH GEMCITABINE (Bladder) CYSTOSCOPY WITH URETHRAL BALLOON DILATATION (Urethra)     Patient location during evaluation: PACU Anesthesia Type: General Level of consciousness: awake and alert and oriented Pain management: pain level controlled Vital Signs Assessment: post-procedure vital signs reviewed and stable Respiratory status: spontaneous breathing, nonlabored ventilation and respiratory function stable Cardiovascular status: blood pressure returned to baseline and stable Postop Assessment: no apparent nausea or vomiting Anesthetic complications: no   No notable events documented.  Last Vitals:  Vitals:   10/02/21 1715 10/02/21 1728  BP: (!) 154/79 (!) 156/90  Pulse: 66 66  Resp: 11 12  Temp:  36.6 C  SpO2: 95% 98%    Last Pain:  Vitals:   10/02/21 1715  TempSrc:   PainSc: 7                  Berneice Zettlemoyer A.

## 2021-10-02 NOTE — Interval H&P Note (Signed)
History and Physical Interval Note:  10/02/2021 12:55 PM  Jerome Cruz  has presented today for surgery, with the diagnosis of BLADDER TUMOR.  The various methods of treatment have been discussed with the patient and family. After consideration of risks, benefits and other options for treatment, the patient has consented to  Procedure(s) with comments: TRANSURETHRAL RESECTION OF BLADDER TUMOR WITH GEMCITABINE (N/A) - 90 MINS as a surgical intervention.  The patient's history has been reviewed, patient examined, no change in status, stable for surgery.  I have reviewed the patient's chart and labs.  Questions were answered to the patient's satisfaction.     Lillyona Polasek D Brentin Shin

## 2021-10-02 NOTE — Anesthesia Procedure Notes (Signed)
Procedure Name: Intubation Date/Time: 10/02/2021 2:10 PM Performed by: Bufford Spikes, CRNA Pre-anesthesia Checklist: Patient identified, Emergency Drugs available, Suction available and Patient being monitored Patient Re-evaluated:Patient Re-evaluated prior to induction Oxygen Delivery Method: Circle system utilized Preoxygenation: Pre-oxygenation with 100% oxygen Induction Type: IV induction Ventilation: Mask ventilation without difficulty Laryngoscope Size: Miller and 2 Tube type: Oral Tube size: 7.0 mm Number of attempts: 1 Airway Equipment and Method: Stylet and Oral airway Placement Confirmation: ETT inserted through vocal cords under direct vision, positive ETCO2 and breath sounds checked- equal and bilateral Secured at: 21 cm Tube secured with: Tape Dental Injury: Teeth and Oropharynx as per pre-operative assessment

## 2021-10-02 NOTE — Op Note (Addendum)
PATIENT:  Jerome Cruz  PRE-OPERATIVE DIAGNOSIS: 1. Bladder tumor 2. Urethral stricture  POST-OPERATIVE DIAGNOSIS: Same  PROCEDURE:  Procedure(s): 1. TRANSURETHRAL RESECTION OF BLADDER TUMOR (TURBT) (4cm.) 2. Urethral baloon dilation   SURGEON:  Jacalyn Lefevre, MD  ANESTHESIA:   General  EBL:  less than 50 mL  DRAINS: Urethral catheter ( 20Fr. Foley)   SPECIMEN:  Bladder tumor  DISPOSITION OF SPECIMEN:  PATHOLOGY  Findings: Normal pendulous urethra 6 French bulbar urethral stricture Left ureteral orifice seen in diverticulum, right orthotopic ureteral orifice Multiple greater than 10 papillary bladder tumors on surface of bladder; approx 4 cm nodular bladder tumor posterior wall Bilateral lobe prostatic hypertrophy, no median lobe  Indication:  65 year old man referred for 2-3 week history of intermittent painless gross hematuria. Cystoscopy revealed multiple bladder tumors concerning for bladder cancer.   Description of operation: The patient was taken to the operating room and administered general anesthesia. They were then placed on the table and moved to the dorsal lithotomy position after which the genitalia was sterilely prepped and draped. An official timeout was then performed.  A 23 French rigid cystoscope was placed in the urethral meatus.  As it was advanced into the bulbar urethra a stricture was encountered.  It was too dense to allow the cystoscope to pass.  The decision was made to proceed with urethral balloon dilation.  The Ultrex balloon dilator was then used to dilate the bulbar stricture over a wire.  The balloon dilator was then deflated and removed.  The 23 French rigid cystoscope was then easily traversed into the bladder.  Findings noted above.  The 25 French resectoscope with the 30 lens and visual obturator were then passed into the bladder under direct visualization. Urethra appeared normal. The visual obturator was then removed and the Gyrus  resectoscope element with 30  lens was then inserted and the bladder was fully and systematically inspected.  Right ureteral orifice was in orthotopic position.  Left ureteral orifice was seen and a small diverticulum.  There were greater than 10 small papillary bladder masses adherent to the surface of the bladder mucosa.  In addition there was a large nodular bladder mass approximately 4 cm in size on the posterior wall.  Visualization was difficult due to significant bleeding.  I first began by resecting the small papillary bladder masses.  Next attention turned to the nodular bladder mass and this was resected systematically until no residual tumor was seen.  There was extensive bipolar electrocautery of the resection bed.  Bilateral orthotopic ureteral orifices were seen at the end of the case.  Reinspection of the bladder revealed all obvious tumor had been fully resected and there was no evidence of perforation. The Microvasive evacuator was then used to irrigate the bladder and remove all of the portions of bladder tumor which were sent to pathology. I then removed the resectoscope.  A 20 French Foley catheter was then inserted in the bladder and irrigated. . The patient was awakened and taken to the recovery room.   PLAN OF CARE: Plan for observation overnight given extensive bladder tumor resection.  We will keep Foley to gravity drainage.  No intravesical gemcitabine was given today.  PATIENT DISPOSITION:  PACU - hemodynamically stable.

## 2021-10-02 NOTE — Discharge Instructions (Signed)

## 2021-10-03 DIAGNOSIS — Z87891 Personal history of nicotine dependence: Secondary | ICD-10-CM | POA: Diagnosis not present

## 2021-10-03 DIAGNOSIS — C674 Malignant neoplasm of posterior wall of bladder: Secondary | ICD-10-CM | POA: Diagnosis not present

## 2021-10-03 DIAGNOSIS — N35912 Unspecified bulbous urethral stricture, male: Secondary | ICD-10-CM | POA: Diagnosis not present

## 2021-10-03 LAB — SURGICAL PATHOLOGY

## 2021-10-03 LAB — TYPE AND SCREEN
ABO/RH(D): A POS
Antibody Screen: NEGATIVE

## 2021-10-03 MED ORDER — TRAMADOL HCL 50 MG PO TABS: 50.0000 mg | ORAL_TABLET | Freq: Four times a day (QID) | ORAL | 0 refills | Status: DC | PRN

## 2021-10-03 MED ORDER — CEFAZOLIN SODIUM-DEXTROSE 1-4 GM/50ML-% IV SOLN
INTRAVENOUS | Status: AC
  Filled 2021-10-03: qty 50

## 2021-10-03 MED ORDER — NITROFURANTOIN MONOHYD MACRO 100 MG PO CAPS: 100.0000 mg | ORAL_CAPSULE | Freq: Two times a day (BID) | ORAL | 0 refills | Status: AC

## 2021-10-03 MED ORDER — OXYBUTYNIN CHLORIDE 5 MG PO TABS: 5.0000 mg | ORAL_TABLET | Freq: Three times a day (TID) | ORAL | 0 refills | Status: DC | PRN

## 2021-10-03 NOTE — Discharge Summary (Signed)
Date of admission: 10/02/2021  Date of discharge: 10/03/2021  Admission diagnosis: bladder tumor  Discharge diagnosis: bladder tumor, urethral stricture  Secondary diagnoses:  Patient Active Problem List   Diagnosis Date Noted   Bladder tumor 10/02/2021   Foot pain 04/20/2012   General medical examination 03/06/2012   HYPERTENSION 09/27/2008   GERD 09/27/2008    Procedures performed: Procedure(s): TRANSURETHRAL RESECTION OF BLADDER TUMOR WITH GEMCITABINE CYSTOSCOPY WITH URETHRAL BALLOON DILATATION  History and Physical: For full details, please see admission history and physical. Briefly, Jerome Cruz is a 65 y.o. year old patient with gross hematuria found to have bladder tumors here for TURBT.   Hospital Course: Patient tolerated the procedure well.  He was then transferred to the floor after an uneventful PACU stay.  His hospital course was uncomplicated.  On POD#1 he had met discharge criteria: was eating a regular diet, was up and ambulating independently,  pain was well controlled, was catheter was draining tea colored urine without clots and was ready to for discharge.   Laboratory values:  Recent Labs    10/02/21 1320  HGB 15.0  HCT 44.0   Recent Labs    10/02/21 1320  NA 142  K 3.7  CL 104  GLUCOSE 93  BUN 18  CREATININE 0.90   No results for input(s): LABPT, INR in the last 72 hours. No results for input(s): LABURIN in the last 72 hours. No results found for this or any previous visit.  Disposition: Home  Discharge instruction: The patient was instructed to be ambulatory but told to refrain from heavy lifting, strenuous activity, or driving.   Discharge medications:  Allergies as of 10/03/2021   No Known Allergies      Medication List     TAKE these medications    esomeprazole 20 MG capsule Commonly known as: NEXIUM Take 20 mg by mouth daily at 12 noon.   nitrofurantoin (macrocrystal-monohydrate) 100 MG capsule Commonly known as:  Macrobid Take 1 capsule (100 mg total) by mouth 2 (two) times daily for 7 days.   oxybutynin 5 MG tablet Commonly known as: DITROPAN Take 1 tablet (5 mg total) by mouth every 8 (eight) hours as needed for bladder spasms.   Shingrix injection Generic drug: Zoster Vaccine Adjuvanted Shingrix (PF) 50 mcg/0.5 mL intramuscular suspension, kit  ADM 0.5ML IM UTD   simvastatin 20 MG tablet Commonly known as: ZOCOR Take 20 mg by mouth at bedtime.   traMADol 50 MG tablet Commonly known as: Ultram Take 1 tablet (50 mg total) by mouth every 6 (six) hours as needed.   valsartan 80 MG tablet Commonly known as: DIOVAN Take 80 mg by mouth every evening.        Followup:   Follow-up Information     [facility] Follow up.   Contact information: [place] [phone]

## 2021-10-04 ENCOUNTER — Encounter (HOSPITAL_BASED_OUTPATIENT_CLINIC_OR_DEPARTMENT_OTHER): Payer: Self-pay | Admitting: Urology

## 2021-10-08 DIAGNOSIS — C679 Malignant neoplasm of bladder, unspecified: Secondary | ICD-10-CM | POA: Diagnosis not present

## 2021-10-08 DIAGNOSIS — I700 Atherosclerosis of aorta: Secondary | ICD-10-CM | POA: Diagnosis not present

## 2021-10-08 DIAGNOSIS — N261 Atrophy of kidney (terminal): Secondary | ICD-10-CM | POA: Diagnosis not present

## 2021-10-08 DIAGNOSIS — J432 Centrilobular emphysema: Secondary | ICD-10-CM | POA: Diagnosis not present

## 2021-10-09 ENCOUNTER — Telehealth: Payer: Self-pay | Admitting: Oncology

## 2021-10-09 NOTE — Telephone Encounter (Signed)
Scheduled appt per 10/18 referral. Pt aware of appt date and time.

## 2021-10-10 NOTE — Addendum Note (Signed)
Addendum  created 10/10/21 1248 by Effie Berkshire, MD   Intraprocedure Staff edited

## 2021-10-15 DIAGNOSIS — R300 Dysuria: Secondary | ICD-10-CM | POA: Diagnosis not present

## 2021-10-17 ENCOUNTER — Inpatient Hospital Stay: Payer: BC Managed Care – PPO | Attending: Oncology | Admitting: Oncology

## 2021-10-17 ENCOUNTER — Other Ambulatory Visit: Payer: Self-pay

## 2021-10-17 DIAGNOSIS — C679 Malignant neoplasm of bladder, unspecified: Secondary | ICD-10-CM | POA: Insufficient documentation

## 2021-10-17 MED ORDER — LIDOCAINE-PRILOCAINE 2.5-2.5 % EX CREA: 1.0000 "application " | TOPICAL_CREAM | CUTANEOUS | 0 refills | Status: AC | PRN

## 2021-10-17 MED ORDER — PROCHLORPERAZINE MALEATE 10 MG PO TABS: 10.0000 mg | ORAL_TABLET | Freq: Four times a day (QID) | ORAL | 0 refills | Status: AC | PRN

## 2021-10-17 NOTE — Progress Notes (Signed)
Reason for the request: Bladder cancer  HPI: I was asked by Dr. Claudia Desanctis to evaluate Mr. Jerome Cruz he is a 65 year old with a history of hypertension and hyperlipidemia who was found to have painless gross hematuria in September 2022.  He was evaluated by Dr. Claudia Desanctis and underwent cystoscopy which showed multiple bladder tumors.  He has subsequently underwent TURBT completed on October 02, 2021 with a final pathology showed infiltrating high-grade papillary urothelial carcinoma that is poorly differentiated and 80% with a glandular component at 5%.  The carcinoma invades the muscularis propria.  CT scan obtained on November 08, 2021 showed no evidence of metastatic disease with focal thickening in the left lateral bladder dome with a lesion measuring 3.6 x 3.6 cm.    clinically, he reports feeling reasonably well without any major complaints.  He does report some occasional bladder spasms with no hematuria or dysuria.  His performance status remain excellent and continues to be active.  He does not report any headaches, blurry vision, syncope or seizures. Does not report any fevers, chills or sweats.  Does not report any cough, wheezing or hemoptysis.  Does not report any chest pain, palpitation, orthopnea or leg edema.  Does not report any nausea, vomiting or abdominal pain.  Does not report any constipation or diarrhea.  Does not report any skeletal complaints.    Does not report frequency, urgency or hematuria.  Does not report any skin rashes or lesions. Does not report any heat or cold intolerance.  Does not report any lymphadenopathy or petechiae.  Does not report any anxiety or depression.  Remaining review of systems is negative.     Past Medical History:  Diagnosis Date   Bladder tumor 09/26/2021   ED (erectile dysfunction)    GERD (gastroesophageal reflux disease)    Hyperlipidemia    Hypertension   :   Past Surgical History:  Procedure Laterality Date   ANKLE SURGERY Right    COLONOSCOPY   2012   COLONOSCOPY  03/27/2018   CYSTOSCOPY WITH URETHRAL DILATATION N/A 10/02/2021   Procedure: CYSTOSCOPY WITH URETHRAL BALLOON DILATATION;  Surgeon: Robley Fries, MD;  Location: [place];  Service: Urology;  Laterality: N/A;   TRANSURETHRAL RESECTION OF BLADDER TUMOR WITH MITOMYCIN-C N/A 10/02/2021   Procedure: TRANSURETHRAL RESECTION OF BLADDER TUMOR WITH GEMCITABINE;  Surgeon: Robley Fries, MD;  Location: [place];  Service: Urology;  Laterality: N/A;  90 MINS  :   Current Outpatient Medications:    esomeprazole (NEXIUM) 20 MG capsule, Take 20 mg by mouth daily at 12 noon., Disp: , Rfl:    oxybutynin (DITROPAN) 5 MG tablet, Take 1 tablet (5 mg total) by mouth every 8 (eight) hours as needed for bladder spasms., Disp: 30 tablet, Rfl: 0   simvastatin (ZOCOR) 20 MG tablet, Take 20 mg by mouth at bedtime., Disp: , Rfl:    traMADol (ULTRAM) 50 MG tablet, Take 1 tablet (50 mg total) by mouth every 6 (six) hours as needed., Disp: 20 tablet, Rfl: 0   valsartan (DIOVAN) 80 MG tablet, Take 80 mg by mouth every evening., Disp: , Rfl:    Zoster Vaccine Adjuvanted (SHINGRIX) injection, Shingrix (PF) 50 mcg/0.5 mL intramuscular suspension, kit  ADM 0.5ML IM UTD, Disp: , Rfl:  No current facility-administered medications for this visit.  Facility-Administered Medications Ordered in Other Visits:    gemcitabine (GEMZAR) chemo syringe for bladder instillation 2,000 mg, 2,000 mg, Bladder Instillation, Once, Robley Fries, MD:  No Known  Allergies:   Family History  Problem Relation Age of Onset   Diabetes Mother    Hypertension Mother    Cancer Father        type unknown   Diabetes Mellitus I Son        since age 41   Colon cancer Neg Hx    Rectal cancer Neg Hx    Stomach cancer Neg Hx    Esophageal cancer Neg Hx   :   Social History   Socioeconomic History   Marital status: Married    Spouse name: Not on file   Number of children: 1    Years of education: Not on file   Highest education level: Not on file  Occupational History   Occupation: self employed-grading / concrete  Tobacco Use   Smoking status: Former    Packs/day: 1.00    Years: 15.00    Pack years: 15.00    Types: Cigarettes    Quit date: 08/23/2001    Years since quitting: 20.1   Smokeless tobacco: Never  Vaping Use   Vaping Use: Never used  Substance and Sexual Activity   Alcohol use: Yes    Comment: occasional beer   Drug use: No   Sexual activity: Not on file  Other Topics Concern   Not on file  Social History Narrative   The patient is married with 1 son   He is a self-employed Psychiatrist, he also has livestock and greenhouses   No tobacco or drug use, occasional beer   Social Determinants of Radio broadcast assistant Strain: Not on file  Food Insecurity: Not on file  Transportation Needs: Not on file  Physical Activity: Not on file  Stress: Not on file  Social Connections: Not on file  Intimate Partner Violence: Not on file  :  Pertinent items are noted in HPI.  Exam: Blood pressure (!) 157/92, pulse 73, temperature 98.9 F (37.2 C), temperature source Oral, resp. rate 18, height _0  (1.702 m), weight 157 lb 4.8 oz (71.4 kg), SpO2 99 %. ECOG 0 General appearance: alert and cooperative appeared without distress. Head: atraumatic without any abnormalities. Eyes: conjunctivae/corneas clear. PERRL.  Sclera anicteric. Throat: lips, mucosa, and tongue normal; without oral thrush or ulcers. Resp: clear to auscultation bilaterally without rhonchi, wheezes or dullness to percussion. Cardio: regular rate and rhythm, S1, S2 normal, no murmur, click, rub or gallop GI: soft, non-tender; bowel sounds normal; no masses,  no organomegaly Skin: Skin color, texture, turgor normal. No rashes or lesions Lymph nodes: Cervical, supraclavicular, and axillary nodes normal. Neurologic: Grossly normal without any motor, sensory or deep  tendon reflexes. Musculoskeletal: No joint deformity or effusion.    Assessment and Plan:   65 year old with:  1.  Bladder cancer diagnosed in September 2022.  He underwent TURBT which showed a high-grade papillary urothelial carcinoma with muscle invasion indicating T2 disease.  He has no obvious evidence of metastatic disease although staging work-up needs to be completed.  The natural course of this disease and treatment options were discussed.  Given his muscle invasive disease radical therapy is necessary including radical cystectomy versus a bladder sparing approach with tri-modality and chemoradiation.  The logistics and risks and benefits of all these approaches were discussed at this time.  I also recommended that neoadjuvant chemotherapy in the setting of a for radical cystectomy.    The logistics and rationale for using chemotherapy was reviewed today in detail.  Complication associated with  cisplatin and gemcitabine chemotherapy was discussed.  These complications include nausea, vomiting, myelosuppression, fatigue, infusion related complications, renal insufficiency, neutropenia, neutropenic sepsis and rarely serious thrombosis, hospitalization and death.  The benefit would also if he has an excellent response to chemotherapy, curative surgical resection may be attempted.  The plan is to treat with gemcitabine and cisplatin on day 1, gemcitabine day 8 out of a 21-day cycle.  Anticipate needing. 4 cycles of therapy    After discussion today, he is agreeable to proceed after chemo education class.     2.  IV access: Risks and benefits of using Port-A-Cath versus peripheral veins was discussed today.  Complication associated with Port-A-Cath insertion include bleeding, infection and thrombosis.  After discussing the risks and benefits, he is agreeable to proceed.   3.  Antiemetics: Prescription for Compazine was made available to him.   4.  Renal function surveillance: will continue  to monitor on cisplatin therapy.  Baseline kidney function is normal.   5.  Goals of care: Treatment at this time is curative.   6.  Follow-up: will be in the immediate future to start chemotherapy.  60  minutes were dedicated to this visit. The time was spent on reviewing laboratory data, imaging studies, discussing treatment options, and answering questions regarding future plan.     A copy of this consult has been forwarded to the requesting physician.

## 2021-10-17 NOTE — Progress Notes (Signed)

## 2021-10-23 ENCOUNTER — Other Ambulatory Visit: Payer: Self-pay | Admitting: Radiology

## 2021-10-23 NOTE — Progress Notes (Signed)
Pharmacist Chemotherapy Monitoring - Initial Assessment    Anticipated start date: 10/30/21   The following has been reviewed per standard work regarding the patient's treatment regimen: The patient's diagnosis, treatment plan and drug doses, and organ/hematologic function Lab orders and baseline tests specific to treatment regimen  The treatment plan start date, drug sequencing, and pre-medications Prior authorization status  Patient's documented medication list, including drug-drug interaction screen and prescriptions for anti-emetics and supportive care specific to the treatment regimen The drug concentrations, fluid compatibility, administration routes, and timing of the medications to be used The patient's access for treatment and lifetime cumulative dose history, if applicable  The patient's medication allergies and previous infusion related reactions, if applicable   Changes made to treatment plan:  N/A  Follow up needed:  Pending authorization for treatment    Larene Beach, [place], 10/23/2021  1:03 PM

## 2021-10-24 ENCOUNTER — Other Ambulatory Visit: Payer: Self-pay | Admitting: Oncology

## 2021-10-24 ENCOUNTER — Encounter (HOSPITAL_COMMUNITY): Payer: Self-pay

## 2021-10-24 ENCOUNTER — Ambulatory Visit (HOSPITAL_COMMUNITY)
Admission: RE | Admit: 2021-10-24 | Discharge: 2021-10-24 | Disposition: A | Discharge status: Home / Self Care | Payer: BC Managed Care – PPO | Source: Ambulatory Visit | Attending: Oncology | Admitting: Oncology

## 2021-10-24 DIAGNOSIS — I10 Essential (primary) hypertension: Secondary | ICD-10-CM | POA: Insufficient documentation

## 2021-10-24 DIAGNOSIS — Z87891 Personal history of nicotine dependence: Secondary | ICD-10-CM | POA: Insufficient documentation

## 2021-10-24 DIAGNOSIS — E785 Hyperlipidemia, unspecified: Secondary | ICD-10-CM | POA: Insufficient documentation

## 2021-10-24 DIAGNOSIS — Z79899 Other long term (current) drug therapy: Secondary | ICD-10-CM | POA: Insufficient documentation

## 2021-10-24 DIAGNOSIS — K219 Gastro-esophageal reflux disease without esophagitis: Secondary | ICD-10-CM | POA: Diagnosis not present

## 2021-10-24 DIAGNOSIS — Z452 Encounter for adjustment and management of vascular access device: Secondary | ICD-10-CM | POA: Diagnosis not present

## 2021-10-24 DIAGNOSIS — C679 Malignant neoplasm of bladder, unspecified: Secondary | ICD-10-CM

## 2021-10-24 HISTORY — PX: IR IMAGING GUIDED PORT INSERTION: IMG5740

## 2021-10-24 MED ORDER — FENTANYL CITRATE (PF) 100 MCG/2ML IJ SOLN
INTRAMUSCULAR | Status: AC | PRN
  Administered 2021-10-24: 50 ug via INTRAVENOUS

## 2021-10-24 MED ORDER — HEPARIN SOD (PORK) LOCK FLUSH 100 UNIT/ML IV SOLN
INTRAVENOUS | Status: AC | PRN
  Administered 2021-10-24: 500 [IU] via INTRAVENOUS

## 2021-10-24 MED ORDER — SODIUM CHLORIDE 0.9 % IV SOLN: INTRAVENOUS | Status: DC

## 2021-10-24 MED ORDER — LIDOCAINE-EPINEPHRINE 1 %-1:100000 IJ SOLN
INTRAMUSCULAR | Status: AC | PRN
  Administered 2021-10-24: 10 mL via INTRADERMAL

## 2021-10-24 MED ORDER — MIDAZOLAM HCL 2 MG/2ML IJ SOLN
INTRAMUSCULAR | Status: AC
  Filled 2021-10-24: qty 4

## 2021-10-24 MED ORDER — HEPARIN SOD (PORK) LOCK FLUSH 100 UNIT/ML IV SOLN
INTRAVENOUS | Status: AC
  Filled 2021-10-24: qty 5

## 2021-10-24 MED ORDER — FENTANYL CITRATE (PF) 100 MCG/2ML IJ SOLN
INTRAMUSCULAR | Status: AC
  Filled 2021-10-24: qty 2

## 2021-10-24 MED ORDER — MIDAZOLAM HCL 2 MG/2ML IJ SOLN
INTRAMUSCULAR | Status: AC | PRN
  Administered 2021-10-24: 1 mg via INTRAVENOUS

## 2021-10-24 MED ORDER — LIDOCAINE-EPINEPHRINE 1 %-1:100000 IJ SOLN
INTRAMUSCULAR | Status: AC
  Filled 2021-10-24: qty 1

## 2021-10-24 NOTE — Consult Note (Signed)
Chief Complaint: Patient was seen in consultation today for port a cath placement  Referring Physician(s): Wyatt Portela  Supervising Physician: Ruthann Cancer  Patient Status: Jerome Cruz  History of Present Illness: Jerome Cruz is a 65 y.o. male, former smoker, with past medical history of GERD, hyperlipidemia, hypertension and painless gross hematuria.  He was recently diagnosed with bladder carcinoma in September of this year and is status post TURBT on 10/02/2021.  He presents today for Port-A-Cath placement to assist with treatment.  Past Medical History:  Diagnosis Date   Bladder tumor 09/26/2021   ED (erectile dysfunction)    GERD (gastroesophageal reflux disease)    Hyperlipidemia    Hypertension     Past Surgical History:  Procedure Laterality Date   ANKLE SURGERY Right    COLONOSCOPY  2012   COLONOSCOPY  03/27/2018   CYSTOSCOPY WITH URETHRAL DILATATION N/A 10/02/2021   Procedure: CYSTOSCOPY WITH URETHRAL BALLOON DILATATION;  Surgeon: Robley Fries, MD;  Location: [place];  Service: Urology;  Laterality: N/A;   TRANSURETHRAL RESECTION OF BLADDER TUMOR WITH MITOMYCIN-C N/A 10/02/2021   Procedure: TRANSURETHRAL RESECTION OF BLADDER TUMOR WITH GEMCITABINE;  Surgeon: Robley Fries, MD;  Location: [place];  Service: Urology;  Laterality: N/A;  90 MINS    Allergies: Patient has no known allergies.  Medications: Prior to Admission medications   Medication Sig Start Date End Date Taking? Authorizing Provider  esomeprazole (NEXIUM) 20 MG capsule Take 20 mg by mouth daily at 12 noon.   Yes [provider]  oxybutynin (DITROPAN) 5 MG tablet Take 1 tablet (5 mg total) by mouth every 8 (eight) hours as needed for bladder spasms. 10/03/21  Yes Pace, Simone Curia D, MD  simvastatin (ZOCOR) 20 MG tablet Take 20 mg by mouth at bedtime.   Yes [provider]  valsartan (DIOVAN) 80 MG tablet Take 80 mg by mouth  every evening.   Yes [provider]  lidocaine-prilocaine (EMLA) cream Apply 1 application topically as needed. 10/17/21   Wyatt Portela, MD  prochlorperazine (COMPAZINE) 10 MG tablet Take 1 tablet (10 mg total) by mouth every 6 (six) hours as needed for nausea or vomiting. 10/17/21   Wyatt Portela, MD  traMADol (ULTRAM) 50 MG tablet Take 1 tablet (50 mg total) by mouth every 6 (six) hours as needed. 10/03/21 10/03/22  Robley Fries, MD  Zoster Vaccine Adjuvanted [facility]) injection Shingrix (PF) 50 mcg/0.5 mL intramuscular suspension, kit  ADM 0.5ML IM UTD    [provider]     Family History  Problem Relation Age of Onset   Diabetes Mother    Hypertension Mother    Cancer Father        type unknown   Diabetes Mellitus I Son        since age 50   Colon cancer Neg Hx    Rectal cancer Neg Hx    Stomach cancer Neg Hx    Esophageal cancer Neg Hx     Social History   Socioeconomic History   Marital status: Married    Spouse name: Not on file   Number of children: 1   Years of education: Not on file   Highest education level: Not on file  Occupational History   Occupation: self employed-grading / concrete  Tobacco Use   Smoking status: Former    Packs/day: 1.00    Years: 15.00    Pack years: 15.00    Types:  Cigarettes    Quit date: 08/23/2001    Years since quitting: 20.1   Smokeless tobacco: Never  Vaping Use   Vaping Use: Never used  Substance and Sexual Activity   Alcohol use: Yes    Comment: occasional beer   Drug use: No   Sexual activity: Not on file  Other Topics Concern   Not on file  Social History Narrative   The patient is married with 1 son   He is a self-employed Psychiatrist, he also has livestock and greenhouses   No tobacco or drug use, occasional beer   Social Determinants of Radio broadcast assistant Strain: Not on file  Food Insecurity: Not on file  Transportation Needs: Not on file  Physical  Activity: Not on file  Stress: Not on file  Social Connections: Not on file      Review of Systems denies fever, headache, chest pain, dyspnea, cough, abdominal/back pain, nausea, vomiting or bleeding.  Does have some urinary urgency.  Vital Signs: BP (!) 157/97 (BP Location: Right Arm)   Pulse 74   Temp 98.3 F (36.8 C) (Oral)   Resp 15   SpO2 97%   Physical Exam awake, alert.  Chest with slightly diminished breath sounds right base, left clear.  Heart with regular rate and rhythm.  Abdomen soft, positive bowel sounds, nontender.  No lower extremity edema.  Imaging: No results found.  Labs:  CBC: Recent Labs    10/02/21 1320  HGB 15.0  HCT 44.0    COAGS: No results for input(s): INR, APTT in the last 8760 hours.  BMP: Recent Labs    10/02/21 1320  NA 142  K 3.7  CL 104  GLUCOSE 93  BUN 18  CREATININE 0.90    LIVER FUNCTION TESTS: No results for input(s): BILITOT, AST, ALT, ALKPHOS, PROT, ALBUMIN in the last 8760 hours.  TUMOR MARKERS: No results for input(s): AFPTM, CEA, CA199, CHROMGRNA in the last 8760 hours.  Assessment and Plan: 66 y.o. male, former smoker, with past medical history of GERD, hyperlipidemia, hypertension and painless gross hematuria.  He was recently diagnosed with bladder carcinoma in September of this year and is status post TURBT on 10/02/2021.  He presents today for Port-A-Cath placement to assist with treatment.Risks and benefits of image guided port-a-catheter placement was discussed with the patient including, but not limited to bleeding, infection, pneumothorax, or fibrin sheath development and need for additional procedures.  All of the patient's questions were answered, patient is agreeable to proceed. Consent signed and in chart.    Thank you for this interesting consult.  I greatly enjoyed meeting Jerome Cruz and look forward to participating in their care.  A copy of this report was sent to the requesting provider on  this date.  Electronically Signed: D. Rowe Robert, PA-C 10/24/2021, 1:00 PM   I spent a total of  25 minutes   in face to face in clinical consultation, greater than 50% of which was counseling/coordinating care for Port-A-Cath placement

## 2021-10-24 NOTE — Procedures (Signed)
Interventional Radiology Procedure Note ° °Procedure: Single Lumen Power Port Placement   ° °Access:  Right internal jugular vein ° °Findings: Catheter tip positioned at cavoatrial junction. Port is ready for immediate use.  ° °Complications: None ° °EBL: < 10 mL ° °Recommendations:  °- Ok to shower in 24 hours °- Do not submerge for 7 days °- Routine line care  ° ° °Payne Garske, MD ° ° ° °

## 2021-10-25 ENCOUNTER — Other Ambulatory Visit: Payer: Self-pay

## 2021-10-25 ENCOUNTER — Inpatient Hospital Stay: Payer: BC Managed Care – PPO | Attending: Oncology

## 2021-10-25 ENCOUNTER — Encounter: Payer: Self-pay | Admitting: Oncology

## 2021-10-25 DIAGNOSIS — N3289 Other specified disorders of bladder: Secondary | ICD-10-CM | POA: Insufficient documentation

## 2021-10-25 DIAGNOSIS — C679 Malignant neoplasm of bladder, unspecified: Secondary | ICD-10-CM | POA: Insufficient documentation

## 2021-10-25 DIAGNOSIS — T451X5A Adverse effect of antineoplastic and immunosuppressive drugs, initial encounter: Secondary | ICD-10-CM | POA: Insufficient documentation

## 2021-10-25 DIAGNOSIS — Z79899 Other long term (current) drug therapy: Secondary | ICD-10-CM | POA: Insufficient documentation

## 2021-10-25 DIAGNOSIS — Z5111 Encounter for antineoplastic chemotherapy: Secondary | ICD-10-CM | POA: Insufficient documentation

## 2021-10-25 DIAGNOSIS — D701 Agranulocytosis secondary to cancer chemotherapy: Secondary | ICD-10-CM | POA: Insufficient documentation

## 2021-10-25 NOTE — Progress Notes (Signed)
Met with patient and accompanying adult at registration to introduce myself as Financial Resource Specialist and to offer available resources.  Discussed one-time $1000 [employer] grant and qualifications to assist with personal expenses while going through treatment.  Gave him my card if interested in applying and for any additional financial questions or concerns. 

## 2021-10-29 MED FILL — Fosaprepitant Dimeglumine For IV Infusion 150 MG (Base Eq): INTRAVENOUS | Qty: 5 | Status: AC

## 2021-10-29 MED FILL — Dexamethasone Sodium Phosphate Inj 100 MG/10ML: INTRAMUSCULAR | Qty: 1 | Status: AC

## 2021-10-30 ENCOUNTER — Other Ambulatory Visit: Payer: Self-pay

## 2021-10-30 ENCOUNTER — Other Ambulatory Visit: Payer: No Typology Code available for payment source

## 2021-10-30 ENCOUNTER — Inpatient Hospital Stay: Payer: BC Managed Care – PPO

## 2021-10-30 ENCOUNTER — Ambulatory Visit: Payer: No Typology Code available for payment source

## 2021-10-30 ENCOUNTER — Ambulatory Visit: Payer: No Typology Code available for payment source | Admitting: Physician Assistant

## 2021-10-30 VITALS — BP 124/71 | HR 58 | Temp 98.1°F | Resp 16

## 2021-10-30 DIAGNOSIS — Z79899 Other long term (current) drug therapy: Secondary | ICD-10-CM | POA: Diagnosis not present

## 2021-10-30 DIAGNOSIS — N3289 Other specified disorders of bladder: Secondary | ICD-10-CM | POA: Diagnosis not present

## 2021-10-30 DIAGNOSIS — C679 Malignant neoplasm of bladder, unspecified: Secondary | ICD-10-CM | POA: Diagnosis not present

## 2021-10-30 DIAGNOSIS — D701 Agranulocytosis secondary to cancer chemotherapy: Secondary | ICD-10-CM | POA: Diagnosis not present

## 2021-10-30 DIAGNOSIS — T451X5A Adverse effect of antineoplastic and immunosuppressive drugs, initial encounter: Secondary | ICD-10-CM | POA: Diagnosis not present

## 2021-10-30 DIAGNOSIS — Z95828 Presence of other vascular implants and grafts: Secondary | ICD-10-CM

## 2021-10-30 DIAGNOSIS — Z5111 Encounter for antineoplastic chemotherapy: Secondary | ICD-10-CM | POA: Diagnosis not present

## 2021-10-30 LAB — CBC WITH DIFFERENTIAL (CANCER CENTER ONLY)
Abs Immature Granulocytes: 0.01 10*3/uL (ref 0.00–0.07)
Basophils Absolute: 0 10*3/uL (ref 0.0–0.1)
Basophils Relative: 0 %
Eosinophils Absolute: 0.1 10*3/uL (ref 0.0–0.5)
Eosinophils Relative: 2 %
HCT: 39.6 % (ref 39.0–52.0)
Hemoglobin: 13.5 g/dL (ref 13.0–17.0)
Immature Granulocytes: 0 %
Lymphocytes Relative: 38 %
Lymphs Abs: 2.2 10*3/uL (ref 0.7–4.0)
MCH: 31.5 pg (ref 26.0–34.0)
MCHC: 34.1 g/dL (ref 30.0–36.0)
MCV: 92.3 fL (ref 80.0–100.0)
Monocytes Absolute: 0.4 10*3/uL (ref 0.1–1.0)
Monocytes Relative: 6 %
Neutro Abs: 3.1 10*3/uL (ref 1.7–7.7)
Neutrophils Relative %: 54 %
Platelet Count: 199 10*3/uL (ref 150–400)
RBC: 4.29 MIL/uL (ref 4.22–5.81)
RDW: 12.5 % (ref 11.5–15.5)
WBC Count: 5.8 10*3/uL (ref 4.0–10.5)
nRBC: 0 % (ref 0.0–0.2)

## 2021-10-30 LAB — CMP (CANCER CENTER ONLY)
ALT: 21 U/L (ref 0–44)
AST: 14 U/L — ABNORMAL LOW (ref 15–41)
Albumin: 3.9 g/dL (ref 3.5–5.0)
Alkaline Phosphatase: 47 U/L (ref 38–126)
Anion gap: 7 (ref 5–15)
BUN: 12 mg/dL (ref 8–23)
CO2: 26 mmol/L (ref 22–32)
Calcium: 9 mg/dL (ref 8.9–10.3)
Chloride: 107 mmol/L (ref 98–111)
Creatinine: 0.84 mg/dL (ref 0.61–1.24)
GFR, Estimated: >60 mL/min (ref >60)
Glucose, Bld: 99 mg/dL (ref 70–99)
Potassium: 3.9 mmol/L (ref 3.5–5.1)
Sodium: 140 mmol/L (ref 135–145)
Total Bilirubin: 0.6 mg/dL (ref 0.3–1.2)
Total Protein: 7.1 g/dL (ref 6.5–8.1)

## 2021-10-30 MED ORDER — SODIUM CHLORIDE 0.9 % IV SOLN
1000.0000 mg/m2 | Freq: Once | INTRAVENOUS | Status: AC
  Administered 2021-10-30: 1824 mg via INTRAVENOUS
  Filled 2021-10-30: qty 47.97

## 2021-10-30 MED ORDER — SODIUM CHLORIDE 0.9 % IV SOLN
10.0000 mg | Freq: Once | INTRAVENOUS | Status: AC
  Administered 2021-10-30: 10 mg via INTRAVENOUS
  Filled 2021-10-30: qty 10

## 2021-10-30 MED ORDER — MAGNESIUM SULFATE 2 GM/50ML IV SOLN
2.0000 g | Freq: Once | INTRAVENOUS | Status: AC
  Administered 2021-10-30: 2 g via INTRAVENOUS
  Filled 2021-10-30: qty 50

## 2021-10-30 MED ORDER — PALONOSETRON HCL INJECTION 0.25 MG/5ML
0.2500 mg | Freq: Once | INTRAVENOUS | Status: AC
  Administered 2021-10-30: 0.25 mg via INTRAVENOUS
  Filled 2021-10-30: qty 5

## 2021-10-30 MED ORDER — POTASSIUM CHLORIDE IN NACL 20-0.9 MEQ/L-% IV SOLN
Freq: Once | INTRAVENOUS | Status: AC
  Filled 2021-10-30: qty 1000

## 2021-10-30 MED ORDER — SODIUM CHLORIDE 0.9 % IV SOLN
150.0000 mg | Freq: Once | INTRAVENOUS | Status: AC
  Administered 2021-10-30: 150 mg via INTRAVENOUS
  Filled 2021-10-30: qty 150

## 2021-10-30 MED ORDER — SODIUM CHLORIDE 0.9 % IV SOLN
70.0000 mg/m2 | Freq: Once | INTRAVENOUS | Status: AC
  Administered 2021-10-30: 129 mg via INTRAVENOUS
  Filled 2021-10-30: qty 129

## 2021-10-30 MED ORDER — SODIUM CHLORIDE 0.9% FLUSH
10.0000 mL | Freq: Once | INTRAVENOUS | Status: AC
  Administered 2021-10-30: 10 mL

## 2021-10-30 MED ORDER — SODIUM CHLORIDE 0.9 % IV SOLN: Freq: Once | INTRAVENOUS | Status: DC

## 2021-10-30 MED ORDER — HEPARIN SOD (PORK) LOCK FLUSH 100 UNIT/ML IV SOLN
500.0000 [IU] | Freq: Once | INTRAVENOUS | Status: AC | PRN
  Administered 2021-10-30: 500 [IU]

## 2021-10-30 MED ORDER — SODIUM CHLORIDE 0.9 % IV SOLN: Freq: Once | INTRAVENOUS | Status: AC

## 2021-10-30 MED ORDER — SODIUM CHLORIDE 0.9% FLUSH
10.0000 mL | INTRAVENOUS | Status: DC | PRN
  Administered 2021-10-30: 10 mL

## 2021-10-30 NOTE — Patient Instructions (Signed)
[facility]  Discharge Instructions: Thank you for choosing [facility] to provide your oncology and hematology care.   If you have a lab appointment with the [facility], please go directly to the [facility] and check in at the registration area.   Wear comfortable clothing and clothing appropriate for easy access to any Portacath or PICC line.   We strive to give you quality time with your provider. You may need to reschedule your appointment if you arrive late (15 or more minutes).  Arriving late affects you and other patients whose appointments are after yours.  Also, if you miss three or more appointments without notifying the office, you may be dismissed from the clinic at the provider's discretion.      For prescription refill requests, have your pharmacy contact our office and allow 72 hours for refills to be completed.    Today you received the following chemotherapy and/or immunotherapy agents :  Cisplatin & Gemcitabine      To help prevent nausea and vomiting after your treatment, we encourage you to take your nausea medication as directed.  BELOW ARE SYMPTOMS THAT SHOULD BE REPORTED IMMEDIATELY: *FEVER GREATER THAN 100.4 F (38 C) OR HIGHER *CHILLS OR SWEATING *NAUSEA AND VOMITING THAT IS NOT CONTROLLED WITH YOUR NAUSEA MEDICATION *UNUSUAL SHORTNESS OF BREATH *UNUSUAL BRUISING OR BLEEDING *URINARY PROBLEMS (pain or burning when urinating, or frequent urination) *BOWEL PROBLEMS (unusual diarrhea, constipation, pain near the anus) TENDERNESS IN MOUTH AND THROAT WITH OR WITHOUT PRESENCE OF ULCERS (sore throat, sores in mouth, or a toothache) UNUSUAL RASH, SWELLING OR PAIN  UNUSUAL VAGINAL DISCHARGE OR ITCHING   Items with * indicate a potential emergency and should be followed up as soon as possible or go to the Emergency Department if any problems should occur.  Please show the CHEMOTHERAPY ALERT CARD or IMMUNOTHERAPY ALERT CARD  at check-in to the Emergency Department and triage nurse.  Should you have questions after your visit or need to cancel or reschedule your appointment, please contact [facility]  Dept: [phone]  and follow the prompts.  Office hours are 8:00 a.m. to 4:30 p.m. Monday - Friday. Please note that voicemails left after 4:00 p.m. may not be returned until the following business day.  We are closed weekends and major holidays. You have access to a nurse at all times for urgent questions. Please call the main number to the clinic Dept: [phone] and follow the prompts.   For any non-urgent questions, you may also contact your provider using MyChart. We now offer e-Visits for anyone 65 and older to request care online for non-urgent symptoms. For details visit [email or web address].   Also download the MyChart app! Go to the app store, search "MyChart", open the app, select [facility], and log in with your MyChart username and password.  Due to Covid, a mask is required upon entering the hospital/clinic. If you do not have a mask, one will be given to you upon arrival. For doctor visits, patients may have 1 support person aged 20 or older with them. For treatment visits, patients cannot have anyone with them due to current Covid guidelines and our immunocompromised population.   Cisplatin injection What is this medication? CISPLATIN (SIS pla tin) is a chemotherapy drug. It targets fast dividing cells, like cancer cells, and causes these cells to die. This medicine is used to treat many types of cancer like bladder, ovarian, and testicular cancers. This  medicine may be used for other purposes; ask your health care provider or pharmacist if you have questions. COMMON BRAND NAME(S): Platinol, Platinol -AQ What should I tell my care team before I take this medication? They need to know if you have any of these conditions: eye disease, vision problems hearing  problems kidney disease low blood counts, like white cells, platelets, or red blood cells tingling of the fingers or toes, or other nerve disorder an unusual or allergic reaction to cisplatin, carboplatin, oxaliplatin, other medicines, foods, dyes, or preservatives pregnant or trying to get pregnant breast-feeding How should I use this medication? This drug is given as an infusion into a vein. It is administered in a hospital or clinic by a specially trained health care professional. Talk to your pediatrician regarding the use of this medicine in children. Special care may be needed. Overdosage: If you think you have taken too much of this medicine contact a poison control center or emergency room at once. NOTE: This medicine is only for you. Do not share this medicine with others. What if I miss a dose? It is important not to miss a dose. Call your doctor or health care professional if you are unable to keep an appointment. What may interact with this medication? This medicine may interact with the following medications: foscarnet certain antibiotics like amikacin, gentamicin, neomycin, polymyxin B, streptomycin, tobramycin, vancomycin This list may not describe all possible interactions. Give your health care provider a list of all the medicines, herbs, non-prescription drugs, or dietary supplements you use. Also tell them if you smoke, drink alcohol, or use illegal drugs. Some items may interact with your medicine. What should I watch for while using this medication? Your condition will be monitored carefully while you are receiving this medicine. You will need important blood work done while you are taking this medicine. This drug may make you feel generally unwell. This is not uncommon, as chemotherapy can affect healthy cells as well as cancer cells. Report any side effects. Continue your course of treatment even though you feel ill unless your doctor tells you to stop. This medicine may  increase your risk of getting an infection. Call your healthcare professional for advice if you get a fever, chills, or sore throat, or other symptoms of a cold or flu. Do not treat yourself. Try to avoid being around people who are sick. Avoid taking medicines that contain aspirin, acetaminophen, ibuprofen, naproxen, or ketoprofen unless instructed by your healthcare professional. These medicines may hide a fever. This medicine may increase your risk to bruise or bleed. Call your doctor or health care professional if you notice any unusual bleeding. Be careful brushing and flossing your teeth or using a toothpick because you may get an infection or bleed more easily. If you have any dental work done, tell your dentist you are receiving this medicine. Do not become pregnant while taking this medicine or for 14 months after stopping it. Women should inform their healthcare professional if they wish to become pregnant or think they might be pregnant. Men should not father a child while taking this medicine and for 11 months after stopping it. There is potential for serious side effects to an unborn child. Talk to your healthcare professional for more information. Do not breast-feed an infant while taking this medicine. This medicine has caused ovarian failure in some women. This medicine may make it more difficult to get pregnant. Talk to your healthcare professional if you are concerned about your fertility.  This medicine has caused decreased sperm counts in some men. This may make it more difficult to father a child. Talk to your healthcare professional if you are concerned about your fertility. Drink fluids as directed while you are taking this medicine. This will help protect your kidneys. Call your doctor or health care professional if you get diarrhea. Do not treat yourself. What side effects may I notice from receiving this medication? Side effects that you should report to your doctor or health care  professional as soon as possible: allergic reactions like skin rash, itching or hives, swelling of the face, lips, or tongue blurred vision changes in vision decreased hearing or ringing of the ears nausea, vomiting pain, redness, or irritation at site where injected pain, tingling, numbness in the hands or feet signs and symptoms of bleeding such as bloody or black, tarry stools; red or dark brown urine; spitting up blood or brown material that looks like coffee grounds; red spots on the skin; unusual bruising or bleeding from the eyes, gums, or nose signs and symptoms of infection like fever; chills; cough; sore throat; pain or trouble passing urine signs and symptoms of kidney injury like trouble passing urine or change in the amount of urine signs and symptoms of low red blood cells or anemia such as unusually weak or tired; feeling faint or lightheaded; falls; breathing problems Side effects that usually do not require medical attention (report to your doctor or health care professional if they continue or are bothersome): loss of appetite mouth sores muscle cramps This list may not describe all possible side effects. Call your doctor for medical advice about side effects. You may report side effects to FDA at 1-800-FDA-1088. Where should I keep my medication? This drug is given in a hospital or clinic and will not be stored at home. NOTE: This sheet is a summary. It may not cover all possible information. If you have questions about this medicine, talk to your doctor, pharmacist, or health care provider.  2022 Elsevier/Gold Standard (2021-08-28 00:00:00) Gemcitabine injection What is this medication? GEMCITABINE (jem SYE ta been) is a chemotherapy drug. This medicine is used to treat many types of cancer like breast cancer, lung cancer, pancreatic cancer, and ovarian cancer. This medicine may be used for other purposes; ask your health care provider or pharmacist if you have  questions. COMMON BRAND NAME(S): Gemzar, Infugem What should I tell my care team before I take this medication? They need to know if you have any of these conditions: blood disorders infection kidney disease liver disease lung or breathing disease, like asthma recent or ongoing radiation therapy an unusual or allergic reaction to gemcitabine, other chemotherapy, other medicines, foods, dyes, or preservatives pregnant or trying to get pregnant breast-feeding How should I use this medication? This drug is given as an infusion into a vein. It is administered in a hospital or clinic by a specially trained health care professional. Talk to your pediatrician regarding the use of this medicine in children. Special care may be needed. Overdosage: If you think you have taken too much of this medicine contact a poison control center or emergency room at once. NOTE: This medicine is only for you. Do not share this medicine with others. What if I miss a dose? It is important not to miss your dose. Call your doctor or health care professional if you are unable to keep an appointment. What may interact with this medication? medicines to increase blood counts like filgrastim, pegfilgrastim, sargramostim  some other chemotherapy drugs like cisplatin vaccines Talk to your doctor or health care professional before taking any of these medicines: acetaminophen aspirin ibuprofen ketoprofen naproxen This list may not describe all possible interactions. Give your health care provider a list of all the medicines, herbs, non-prescription drugs, or dietary supplements you use. Also tell them if you smoke, drink alcohol, or use illegal drugs. Some items may interact with your medicine. What should I watch for while using this medication? Visit your doctor for checks on your progress. This drug may make you feel generally unwell. This is not uncommon, as chemotherapy can affect healthy cells as well as cancer  cells. Report any side effects. Continue your course of treatment even though you feel ill unless your doctor tells you to stop. In some cases, you may be given additional medicines to help with side effects. Follow all directions for their use. Call your doctor or health care professional for advice if you get a fever, chills or sore throat, or other symptoms of a cold or flu. Do not treat yourself. This drug decreases your body's ability to fight infections. Try to avoid being around people who are sick. This medicine may increase your risk to bruise or bleed. Call your doctor or health care professional if you notice any unusual bleeding. Be careful brushing and flossing your teeth or using a toothpick because you may get an infection or bleed more easily. If you have any dental work done, tell your dentist you are receiving this medicine. Avoid taking products that contain aspirin, acetaminophen, ibuprofen, naproxen, or ketoprofen unless instructed by your doctor. These medicines may hide a fever. Do not become pregnant while taking this medicine or for 6 months after stopping it. Women should inform their doctor if they wish to become pregnant or think they might be pregnant. Men should not father a child while taking this medicine and for 3 months after stopping it. There is a potential for serious side effects to an unborn child. Talk to your health care professional or pharmacist for more information. Do not breast-feed an infant while taking this medicine or for at least 1 week after stopping it. Men should inform their doctors if they wish to father a child. This medicine may lower sperm counts. Talk with your doctor or health care professional if you are concerned about your fertility. What side effects may I notice from receiving this medication? Side effects that you should report to your doctor or health care professional as soon as possible: allergic reactions like skin rash, itching or  hives, swelling of the face, lips, or tongue breathing problems pain, redness, or irritation at site where injected signs and symptoms of a dangerous change in heartbeat or heart rhythm like chest pain; dizziness; fast or irregular heartbeat; palpitations; feeling faint or lightheaded, falls; breathing problems signs of decreased platelets or bleeding - bruising, pinpoint red spots on the skin, black, tarry stools, blood in the urine signs of decreased red blood cells - unusually weak or tired, feeling faint or lightheaded, falls signs of infection - fever or chills, cough, sore throat, pain or difficulty passing urine signs and symptoms of kidney injury like trouble passing urine or change in the amount of urine signs and symptoms of liver injury like dark yellow or brown urine; general ill feeling or flu-like symptoms; light-colored stools; loss of appetite; nausea; right upper belly pain; unusually weak or tired; yellowing of the eyes or skin swelling of ankles, feet, hands Side  effects that usually do not require medical attention (report to your doctor or health care professional if they continue or are bothersome): constipation diarrhea hair loss loss of appetite nausea rash vomiting This list may not describe all possible side effects. Call your doctor for medical advice about side effects. You may report side effects to FDA at 1-800-FDA-1088. Where should I keep my medication? This drug is given in a hospital or clinic and will not be stored at home. NOTE: This sheet is a summary. It may not cover all possible information. If you have questions about this medicine, talk to your doctor, pharmacist, or health care provider.  2022 Elsevier/Gold Standard (2018-03-04 00:00:00)

## 2021-11-06 ENCOUNTER — Inpatient Hospital Stay: Payer: BC Managed Care – PPO

## 2021-11-06 ENCOUNTER — Inpatient Hospital Stay (HOSPITAL_BASED_OUTPATIENT_CLINIC_OR_DEPARTMENT_OTHER): Payer: BC Managed Care – PPO | Admitting: Oncology

## 2021-11-06 ENCOUNTER — Other Ambulatory Visit: Payer: Self-pay

## 2021-11-06 ENCOUNTER — Encounter: Payer: Self-pay | Admitting: Oncology

## 2021-11-06 VITALS — BP 133/90 | HR 97 | Temp 99.1°F | Resp 18 | Ht 67.0 in | Wt 159.1 lb

## 2021-11-06 DIAGNOSIS — T451X5A Adverse effect of antineoplastic and immunosuppressive drugs, initial encounter: Secondary | ICD-10-CM | POA: Diagnosis not present

## 2021-11-06 DIAGNOSIS — Z95828 Presence of other vascular implants and grafts: Secondary | ICD-10-CM

## 2021-11-06 DIAGNOSIS — C679 Malignant neoplasm of bladder, unspecified: Secondary | ICD-10-CM

## 2021-11-06 DIAGNOSIS — N3289 Other specified disorders of bladder: Secondary | ICD-10-CM | POA: Diagnosis not present

## 2021-11-06 DIAGNOSIS — Z5111 Encounter for antineoplastic chemotherapy: Secondary | ICD-10-CM | POA: Diagnosis not present

## 2021-11-06 DIAGNOSIS — Z79899 Other long term (current) drug therapy: Secondary | ICD-10-CM | POA: Diagnosis not present

## 2021-11-06 DIAGNOSIS — D701 Agranulocytosis secondary to cancer chemotherapy: Secondary | ICD-10-CM | POA: Diagnosis not present

## 2021-11-06 LAB — CMP (CANCER CENTER ONLY)
ALT: 62 U/L — ABNORMAL HIGH (ref 0–44)
AST: 27 U/L (ref 15–41)
Albumin: 3.9 g/dL (ref 3.5–5.0)
Alkaline Phosphatase: 52 U/L (ref 38–126)
Anion gap: 10 (ref 5–15)
BUN: 24 mg/dL — ABNORMAL HIGH (ref 8–23)
CO2: 25 mmol/L (ref 22–32)
Calcium: 9.2 mg/dL (ref 8.9–10.3)
Chloride: 105 mmol/L (ref 98–111)
Creatinine: 1.1 mg/dL (ref 0.61–1.24)
GFR, Estimated: >60 mL/min
Glucose, Bld: 123 mg/dL — ABNORMAL HIGH (ref 70–99)
Potassium: 3.9 mmol/L (ref 3.5–5.1)
Sodium: 140 mmol/L (ref 135–145)
Total Bilirubin: 0.3 mg/dL (ref 0.3–1.2)
Total Protein: 7.3 g/dL (ref 6.5–8.1)

## 2021-11-06 LAB — CBC WITH DIFFERENTIAL (CANCER CENTER ONLY)
Abs Immature Granulocytes: 0.01 K/uL (ref 0.00–0.07)
Basophils Absolute: 0 K/uL (ref 0.0–0.1)
Basophils Relative: 0 %
Eosinophils Absolute: 0 K/uL (ref 0.0–0.5)
Eosinophils Relative: 1 %
HCT: 37.7 % — ABNORMAL LOW (ref 39.0–52.0)
Hemoglobin: 13.5 g/dL (ref 13.0–17.0)
Immature Granulocytes: 0 %
Lymphocytes Relative: 39 %
Lymphs Abs: 1.5 K/uL (ref 0.7–4.0)
MCH: 32.5 pg (ref 26.0–34.0)
MCHC: 35.8 g/dL (ref 30.0–36.0)
MCV: 90.6 fL (ref 80.0–100.0)
Monocytes Absolute: 0.1 K/uL (ref 0.1–1.0)
Monocytes Relative: 2 %
Neutro Abs: 2.3 K/uL (ref 1.7–7.7)
Neutrophils Relative %: 58 %
Platelet Count: 139 K/uL — ABNORMAL LOW (ref 150–400)
RBC: 4.16 MIL/uL — ABNORMAL LOW (ref 4.22–5.81)
RDW: 11.9 % (ref 11.5–15.5)
WBC Count: 3.9 K/uL — ABNORMAL LOW (ref 4.0–10.5)
nRBC: 0 % (ref 0.0–0.2)

## 2021-11-06 LAB — MAGNESIUM: Magnesium: 1.8 mg/dL (ref 1.7–2.4)

## 2021-11-06 MED ORDER — SODIUM CHLORIDE 0.9% FLUSH
10.0000 mL | Freq: Once | INTRAVENOUS | Status: AC
  Administered 2021-11-06: 10 mL

## 2021-11-06 MED ORDER — SODIUM CHLORIDE 0.9% FLUSH
10.0000 mL | INTRAVENOUS | Status: DC | PRN
  Administered 2021-11-06: 10 mL

## 2021-11-06 MED ORDER — PROCHLORPERAZINE MALEATE 10 MG PO TABS
10.0000 mg | ORAL_TABLET | Freq: Once | ORAL | Status: AC
  Administered 2021-11-06: 10 mg via ORAL
  Filled 2021-11-06: qty 1

## 2021-11-06 MED ORDER — OXYBUTYNIN CHLORIDE 5 MG PO TABS
5.0000 mg | ORAL_TABLET | Freq: Three times a day (TID) | ORAL | 3 refills | Status: DC | PRN
Start: 2021-11-06 — End: 2022-04-02

## 2021-11-06 MED ORDER — HEPARIN SOD (PORK) LOCK FLUSH 100 UNIT/ML IV SOLN
500.0000 [IU] | Freq: Once | INTRAVENOUS | Status: AC | PRN
  Administered 2021-11-06: 500 [IU]

## 2021-11-06 MED ORDER — SODIUM CHLORIDE 0.9 % IV SOLN
1000.0000 mg/m2 | Freq: Once | INTRAVENOUS | Status: AC
  Administered 2021-11-06: 1824 mg via INTRAVENOUS
  Filled 2021-11-06: qty 47.97

## 2021-11-06 MED ORDER — SODIUM CHLORIDE 0.9 % IV SOLN: Freq: Once | INTRAVENOUS | Status: AC

## 2021-11-06 NOTE — Progress Notes (Signed)
Hematology and Oncology Follow Up Visit  Jerome Cruz [id number] 1956/09/25 65 y.o. 11/06/2021 11:53 AM Jerome Cruz, [name], [name], MD   Principle Diagnosis: 65 year old with T2N0 high-grade urothelial carcinoma of the bladder diagnosed in September 2022.   Prior Therapy:  He is status post TURBT in September 26, 2021.  Final pathology showed infiltrating high-grade papillary urothelial carcinoma that is poorly differentiated and 80% with a glandular component at 5%.   Current therapy: Neoadjuvant chemotherapy utilizing gemcitabine and cisplatin started on October 30, 2021.  He is here for day 8 cycle 1.  Interim History: [name] returns today for a follow-up visit.  Since last visit, he completed day 1 cycle 1 of chemotherapy with gemcitabine and cisplatin without any complications.  He denies any nausea, vomiting or abdominal pain.  He denies any excessive fatigue or tiredness.  He is able to perform activities of daily living without any decline.  He denies any hospitalizations or illnesses.     Medications: I have reviewed the patient's current medications.  Current Outpatient Medications  Medication Sig Dispense Refill   esomeprazole (NEXIUM) 20 MG capsule Take 20 mg by mouth daily at 12 noon.     lidocaine-prilocaine (EMLA) cream Apply 1 application topically as needed. 30 g 0   oxybutynin (DITROPAN) 5 MG tablet Take 1 tablet (5 mg total) by mouth every 8 (eight) hours as needed for bladder spasms. 30 tablet 3   prochlorperazine (COMPAZINE) 10 MG tablet Take 1 tablet (10 mg total) by mouth every 6 (six) hours as needed for nausea or vomiting. 30 tablet 0   simvastatin (ZOCOR) 20 MG tablet Take 20 mg by mouth at bedtime.     traMADol (ULTRAM) 50 MG tablet Take 1 tablet (50 mg total) by mouth every 6 (six) hours as needed. 20 tablet 0   valsartan (DIOVAN) 80 MG tablet Take 80 mg by mouth every evening.     Zoster Vaccine Adjuvanted [facility]) injection Shingrix (PF) 50 mcg/0.5  mL intramuscular suspension, kit  ADM 0.5ML IM UTD     No current facility-administered medications for this visit.   Facility-Administered Medications Ordered in Other Visits  Medication Dose Route Frequency Provider Last Rate Last Admin   gemcitabine (GEMZAR) chemo syringe for bladder instillation 2,000 mg  2,000 mg Bladder Instillation Once Robley Fries, MD         Allergies: No Known Allergies    Physical Exam: Blood pressure 133/90, pulse 97, temperature 99.1 F (37.3 C), temperature source Oral, resp. rate 18, height _0  (1.702 m), weight 159 lb 1.6 oz (72.2 kg), SpO2 98 %. ECOG: 0   General appearance: Comfortable appearing without any discomfort Head: Normocephalic without any trauma Oropharynx: Mucous membranes are moist and pink without any thrush or ulcers. Eyes: Pupils are equal and round reactive to light. Lymph nodes: No cervical, supraclavicular, inguinal or axillary lymphadenopathy.   Heart:regular rate and rhythm.  S1 and S2 without leg edema. Lung: Clear without any rhonchi or wheezes.  No dullness to percussion. Abdomin: Soft, nontender, nondistended with good bowel sounds.  No hepatosplenomegaly. Musculoskeletal: No joint deformity or effusion.  Full range of motion noted. Neurological: No deficits noted on motor, sensory and deep tendon reflex exam. Skin: No petechial rash or dryness.  Appeared moist.      Lab Results: Lab Results  Component Value Date   WBC 3.9 (L) 11/06/2021   HGB 13.5 11/06/2021   HCT 37.7 (L) 11/06/2021   MCV 90.6 11/06/2021  PLT 139 (L) 11/06/2021     Chemistry      Component Value Date/Time   NA 140 11/06/2021 1111   K 3.9 11/06/2021 1111   CL 105 11/06/2021 1111   CO2 25 11/06/2021 1111   BUN 24 (H) 11/06/2021 1111   CREATININE 1.10 11/06/2021 1111      Component Value Date/Time   CALCIUM 9.2 11/06/2021 1111   ALKPHOS 52 11/06/2021 1111   AST 27 11/06/2021 1111   ALT 62 (H) 11/06/2021 1111   BILITOT 0.3  11/06/2021 1111          Impression and Plan:  65 year old with:  1.  T2N0 high-grade papillary urothelial carcinoma of the bladder with muscle invasion diagnosed in September 2022.     His disease status was updated at this time and treatment choices were reiterated.  He is currently receiving neoadjuvant chemotherapy in anticipation of radical cystectomy.  Risks and benefits of continuing this treatment were discussed at this time.  Complications that include nausea, vomiting, mild suppression, neutropenia and possible sepsis were reiterated.  He is agreeable to proceed and the plan is to complete 4 cycles of therapy     2.  IV access: Port-A-Cath is inserted without any complications and will continue to be in use.   3.  Antiemetics: Compazine is available to help with any nausea or vomiting.   4.  Renal function surveillance: Baseline creatinine is within normal range and will monitor on platinum therapy.  5.  Bladder spasm: He is currently on Ditropan prescription refilled for him.    6.  Follow-up: In 2 weeks for the start of cycle 2 of therapy.   30  minutes were spent on this encounter.  Time was dedicated to reviewing laboratory data, disease status update and outlining future plan of care.          Zola Button, MD 11/15/202211:53 AM

## 2021-11-06 NOTE — Patient Instructions (Signed)
[facility] Discharge Instructions for Patients Receiving Chemotherapy  Today you received the following chemotherapy agents gemzar  To help prevent nausea and vomiting after your treatment, we encourage you to take your nausea medication as directed.  If you develop nausea and vomiting that is not controlled by your nausea medication, call the clinic.   BELOW ARE SYMPTOMS THAT SHOULD BE REPORTED IMMEDIATELY:  *FEVER GREATER THAN 100.5 F  *CHILLS WITH OR WITHOUT FEVER  NAUSEA AND VOMITING THAT IS NOT CONTROLLED WITH YOUR NAUSEA MEDICATION  *UNUSUAL SHORTNESS OF BREATH  *UNUSUAL BRUISING OR BLEEDING  TENDERNESS IN MOUTH AND THROAT WITH OR WITHOUT PRESENCE OF ULCERS  *URINARY PROBLEMS  *BOWEL PROBLEMS  UNUSUAL RASH Items with * indicate a potential emergency and should be followed up as soon as possible.  Feel free to call the clinic you have any questions or concerns. The clinic phone number is [phone].  

## 2021-11-09 ENCOUNTER — Telehealth: Payer: Self-pay | Admitting: Oncology

## 2021-11-09 NOTE — Telephone Encounter (Signed)
Scheduled per 11/16 los, patient has been called and voicemail was left.

## 2021-11-20 ENCOUNTER — Inpatient Hospital Stay: Payer: BC Managed Care – PPO

## 2021-11-20 ENCOUNTER — Other Ambulatory Visit: Payer: Self-pay

## 2021-11-20 ENCOUNTER — Ambulatory Visit: Payer: No Typology Code available for payment source | Admitting: Physician Assistant

## 2021-11-20 ENCOUNTER — Inpatient Hospital Stay (HOSPITAL_BASED_OUTPATIENT_CLINIC_OR_DEPARTMENT_OTHER): Payer: BC Managed Care – PPO | Admitting: Oncology

## 2021-11-20 VITALS — BP 154/98 | HR 70 | Temp 96.6°F | Resp 18 | Wt 160.8 lb

## 2021-11-20 VITALS — BP 149/90

## 2021-11-20 DIAGNOSIS — C679 Malignant neoplasm of bladder, unspecified: Secondary | ICD-10-CM

## 2021-11-20 DIAGNOSIS — D701 Agranulocytosis secondary to cancer chemotherapy: Secondary | ICD-10-CM | POA: Diagnosis not present

## 2021-11-20 DIAGNOSIS — N3289 Other specified disorders of bladder: Secondary | ICD-10-CM | POA: Diagnosis not present

## 2021-11-20 DIAGNOSIS — T451X5A Adverse effect of antineoplastic and immunosuppressive drugs, initial encounter: Secondary | ICD-10-CM | POA: Diagnosis not present

## 2021-11-20 DIAGNOSIS — Z79899 Other long term (current) drug therapy: Secondary | ICD-10-CM | POA: Diagnosis not present

## 2021-11-20 DIAGNOSIS — Z95828 Presence of other vascular implants and grafts: Secondary | ICD-10-CM

## 2021-11-20 DIAGNOSIS — Z5111 Encounter for antineoplastic chemotherapy: Secondary | ICD-10-CM | POA: Diagnosis not present

## 2021-11-20 LAB — CBC WITH DIFFERENTIAL (CANCER CENTER ONLY)
Abs Immature Granulocytes: 0.05 10*3/uL (ref 0.00–0.07)
Basophils Absolute: 0 10*3/uL (ref 0.0–0.1)
Basophils Relative: 0 %
Eosinophils Absolute: 0.1 10*3/uL (ref 0.0–0.5)
Eosinophils Relative: 2 %
HCT: 30.4 % — ABNORMAL LOW (ref 39.0–52.0)
Hemoglobin: 10.6 g/dL — ABNORMAL LOW (ref 13.0–17.0)
Immature Granulocytes: 1 %
Lymphocytes Relative: 69 %
Lymphs Abs: 2.5 10*3/uL (ref 0.7–4.0)
MCH: 31.6 pg (ref 26.0–34.0)
MCHC: 34.9 g/dL (ref 30.0–36.0)
MCV: 90.7 fL (ref 80.0–100.0)
Monocytes Absolute: 0.6 10*3/uL (ref 0.1–1.0)
Monocytes Relative: 15 %
Neutro Abs: 0.5 10*3/uL — ABNORMAL LOW (ref 1.7–7.7)
Neutrophils Relative %: 13 %
Platelet Count: 359 10*3/uL (ref 150–400)
RBC: 3.35 MIL/uL — ABNORMAL LOW (ref 4.22–5.81)
RDW: 12 % (ref 11.5–15.5)
WBC Count: 3.7 10*3/uL — ABNORMAL LOW (ref 4.0–10.5)
nRBC: 0 % (ref 0.0–0.2)

## 2021-11-20 LAB — CMP (CANCER CENTER ONLY)
ALT: 25 U/L (ref 0–44)
AST: 20 U/L (ref 15–41)
Albumin: 3.8 g/dL (ref 3.5–5.0)
Alkaline Phosphatase: 47 U/L (ref 38–126)
Anion gap: 7 (ref 5–15)
BUN: 19 mg/dL (ref 8–23)
CO2: 26 mmol/L (ref 22–32)
Calcium: 8.8 mg/dL — ABNORMAL LOW (ref 8.9–10.3)
Chloride: 105 mmol/L (ref 98–111)
Creatinine: 1.09 mg/dL (ref 0.61–1.24)
GFR, Estimated: >60 mL/min (ref >60)
Glucose, Bld: 114 mg/dL — ABNORMAL HIGH (ref 70–99)
Potassium: 3.6 mmol/L (ref 3.5–5.1)
Sodium: 138 mmol/L (ref 135–145)
Total Bilirubin: 0.5 mg/dL (ref 0.3–1.2)
Total Protein: 6.9 g/dL (ref 6.5–8.1)

## 2021-11-20 MED ORDER — SODIUM CHLORIDE 0.9 % IV SOLN
70.0000 mg/m2 | Freq: Once | INTRAVENOUS | Status: AC
  Administered 2021-11-20: 129 mg via INTRAVENOUS
  Filled 2021-11-20: qty 129

## 2021-11-20 MED ORDER — HEPARIN SOD (PORK) LOCK FLUSH 100 UNIT/ML IV SOLN
500.0000 [IU] | Freq: Once | INTRAVENOUS | Status: AC | PRN
  Administered 2021-11-20: 500 [IU]

## 2021-11-20 MED ORDER — SODIUM CHLORIDE 0.9% FLUSH
10.0000 mL | INTRAVENOUS | Status: DC | PRN
  Administered 2021-11-20: 10 mL

## 2021-11-20 MED ORDER — SODIUM CHLORIDE 0.9 % IV SOLN
1000.0000 mg/m2 | Freq: Once | INTRAVENOUS | Status: AC
  Administered 2021-11-20: 1824 mg via INTRAVENOUS
  Filled 2021-11-20: qty 47.97

## 2021-11-20 MED ORDER — SODIUM CHLORIDE 0.9 % IV SOLN
10.0000 mg | Freq: Once | INTRAVENOUS | Status: AC
  Administered 2021-11-20: 10 mg via INTRAVENOUS
  Filled 2021-11-20: qty 10

## 2021-11-20 MED ORDER — SODIUM CHLORIDE 0.9 % IV SOLN
150.0000 mg | Freq: Once | INTRAVENOUS | Status: AC
  Administered 2021-11-20: 150 mg via INTRAVENOUS
  Filled 2021-11-20: qty 150

## 2021-11-20 MED ORDER — SODIUM CHLORIDE 0.9 % IV SOLN
Freq: Once | INTRAVENOUS | Status: AC
Start: 2021-11-20 — End: 2021-11-20

## 2021-11-20 MED ORDER — SODIUM CHLORIDE 0.9% FLUSH
10.0000 mL | Freq: Once | INTRAVENOUS | Status: AC
  Administered 2021-11-20: 10 mL

## 2021-11-20 MED ORDER — MAGNESIUM SULFATE 2 GM/50ML IV SOLN
2.0000 g | Freq: Once | INTRAVENOUS | Status: AC
  Administered 2021-11-20: 2 g via INTRAVENOUS

## 2021-11-20 MED ORDER — PALONOSETRON HCL INJECTION 0.25 MG/5ML
0.2500 mg | Freq: Once | INTRAVENOUS | Status: AC
  Administered 2021-11-20: 0.25 mg via INTRAVENOUS

## 2021-11-20 MED ORDER — POTASSIUM CHLORIDE IN NACL 20-0.9 MEQ/L-% IV SOLN
Freq: Once | INTRAVENOUS | Status: AC
  Filled 2021-11-20: qty 1000

## 2021-11-20 NOTE — Patient Instructions (Signed)
[facility]  Discharge Instructions: Thank you for choosing [facility] to provide your oncology and hematology care.   If you have a lab appointment with the [facility], please go directly to the [facility] and check in at the registration area.   Wear comfortable clothing and clothing appropriate for easy access to any Portacath or PICC line.   We strive to give you quality time with your provider. You may need to reschedule your appointment if you arrive late (15 or more minutes).  Arriving late affects you and other patients whose appointments are after yours.  Also, if you miss three or more appointments without notifying the office, you may be dismissed from the clinic at the provider's discretion.      For prescription refill requests, have your pharmacy contact our office and allow 72 hours for refills to be completed.    Today you received the following chemotherapy and/or immunotherapy agents Cisplatin and Gemzar      To help prevent nausea and vomiting after your treatment, we encourage you to take your nausea medication as directed.  BELOW ARE SYMPTOMS THAT SHOULD BE REPORTED IMMEDIATELY: *FEVER GREATER THAN 100.4 F (38 C) OR HIGHER *CHILLS OR SWEATING *NAUSEA AND VOMITING THAT IS NOT CONTROLLED WITH YOUR NAUSEA MEDICATION *UNUSUAL SHORTNESS OF BREATH *UNUSUAL BRUISING OR BLEEDING *URINARY PROBLEMS (pain or burning when urinating, or frequent urination) *BOWEL PROBLEMS (unusual diarrhea, constipation, pain near the anus) TENDERNESS IN MOUTH AND THROAT WITH OR WITHOUT PRESENCE OF ULCERS (sore throat, sores in mouth, or a toothache) UNUSUAL RASH, SWELLING OR PAIN  UNUSUAL VAGINAL DISCHARGE OR ITCHING   Items with * indicate a potential emergency and should be followed up as soon as possible or go to the Emergency Department if any problems should occur.  Please show the CHEMOTHERAPY ALERT CARD or IMMUNOTHERAPY ALERT CARD at  check-in to the Emergency Department and triage nurse.  Should you have questions after your visit or need to cancel or reschedule your appointment, please contact [facility]  Dept: [phone]  and follow the prompts.  Office hours are 8:00 a.m. to 4:30 p.m. Monday - Friday. Please note that voicemails left after 4:00 p.m. may not be returned until the following business day.  We are closed weekends and major holidays. You have access to a nurse at all times for urgent questions. Please call the main number to the clinic Dept: [phone] and follow the prompts.   For any non-urgent questions, you may also contact your provider using MyChart. We now offer e-Visits for anyone 32 and older to request care online for non-urgent symptoms. For details visit [email or web address].   Also download the MyChart app! Go to the app store, search "MyChart", open the app, select [facility], and log in with your MyChart username and password.  Due to Covid, a mask is required upon entering the hospital/clinic. If you do not have a mask, one will be given to you upon arrival. For doctor visits, patients may have 1 support person aged 44 or older with them. For treatment visits, patients cannot have anyone with them due to current Covid guidelines and our immunocompromised population.

## 2021-11-20 NOTE — Progress Notes (Signed)
ANC 0.5.  Per Dr. Alen Blew proceed with chemotherapy and no GSF injection needed.   Mag level not done today.  Per Dr. Alen Blew ok to proceed with chemotherapy without.

## 2021-11-20 NOTE — Progress Notes (Signed)
Hematology and Oncology Follow Up Visit  Jerome Cruz [id number] 1956-06-13 65 y.o. 11/20/2021 7:58 AM Jerome Cruz, [name], Jerome Guadeloupe, MD   Principle Diagnosis: 65 year old with bladder cancer diagnosed in September 2022.  He was found to have T2N0 high-grade urothelial carcinoma.   Prior Therapy:  He is status post TURBT in September 26, 2021.  Final pathology showed infiltrating high-grade papillary urothelial carcinoma that is poorly differentiated and 80% with a glandular component at 5%.   Current therapy: Neoadjuvant chemotherapy utilizing gemcitabine and cisplatin started on October 30, 2021.  He is here for day 1 cycle 8.  Interim History: Jerome Cruz is here for a follow-up evaluation.  Since the last visit, he completed the first cycle of chemotherapy without any major complaints.  He denies any nausea, vomiting or abdominal pain.  He denies any recent hospitalizations or illnesses.  He denies any worsening neuropathy, fatigue or changes in his bowel habits.  He denies any hematuria or dysuria.     Medications: Reviewed without changes. Current Outpatient Medications  Medication Sig Dispense Refill   esomeprazole (NEXIUM) 20 MG capsule Take 20 mg by mouth daily at 12 noon.     lidocaine-prilocaine (EMLA) cream Apply 1 application topically as needed. 30 g 0   oxybutynin (DITROPAN) 5 MG tablet Take 1 tablet (5 mg total) by mouth every 8 (eight) hours as needed for bladder spasms. 30 tablet 3   prochlorperazine (COMPAZINE) 10 MG tablet Take 1 tablet (10 mg total) by mouth every 6 (six) hours as needed for nausea or vomiting. 30 tablet 0   simvastatin (ZOCOR) 20 MG tablet Take 20 mg by mouth at bedtime.     traMADol (ULTRAM) 50 MG tablet Take 1 tablet (50 mg total) by mouth every 6 (six) hours as needed. 20 tablet 0   valsartan (DIOVAN) 80 MG tablet Take 80 mg by mouth every evening.     Zoster Vaccine Adjuvanted [facility]) injection Shingrix (PF) 50 mcg/0.5 mL intramuscular  suspension, kit  ADM 0.5ML IM UTD     No current facility-administered medications for this visit.   Facility-Administered Medications Ordered in Other Visits  Medication Dose Route Frequency Provider Last Rate Last Admin   gemcitabine (GEMZAR) chemo syringe for bladder instillation 2,000 mg  2,000 mg Bladder Instillation Once Jerome Fries, MD         Allergies: No Known Allergies    Physical Exam:  ECOG: 0   General appearance: Alert, awake without any distress. Head: Atraumatic without abnormalities Oropharynx: Without any thrush or ulcers. Eyes: No scleral icterus. Lymph nodes: No lymphadenopathy noted in the cervical, supraclavicular, or axillary nodes Heart:regular rate and rhythm, without any murmurs or gallops.   Lung: Clear to auscultation without any rhonchi, wheezes or dullness to percussion. Abdomin: Soft, nontender without any shifting dullness or ascites. Musculoskeletal: No clubbing or cyanosis. Neurological: No motor or sensory deficits. Skin: No rashes or lesions.      Lab Results: Lab Results  Component Value Date   WBC 3.9 (L) 11/06/2021   HGB 13.5 11/06/2021   HCT 37.7 (L) 11/06/2021   MCV 90.6 11/06/2021   PLT 139 (L) 11/06/2021     Chemistry      Component Value Date/Time   NA 140 11/06/2021 1111   K 3.9 11/06/2021 1111   CL 105 11/06/2021 1111   CO2 25 11/06/2021 1111   BUN 24 (H) 11/06/2021 1111   CREATININE 1.10 11/06/2021 1111      Component Value Date/Time  CALCIUM 9.2 11/06/2021 1111   ALKPHOS 52 11/06/2021 1111   AST 27 11/06/2021 1111   ALT 62 (H) 11/06/2021 1111   BILITOT 0.3 11/06/2021 1111          Impression and Plan:  65 year old with:  1.  Bladder cancer diagnosed in September 2022.  He was found to have T2N0 high-grade papillary urothelial carcinoma.     Risks and benefits of continuing chemotherapy treatment were discussed at this time.  Complications that include myelosuppression, nausea, vomiting  and worsening neuropathy were reiterated.  After discussion he is agreeable to proceed and the plan is to complete 4 cycles of therapy.     2.  IV access: Port-A-Cath remains in use without any issues.   3.  Antiemetics: No nausea or vomiting reported at this time.  Compazine is available to him.   4.  Renal function surveillance: Creatinine clearance remains normal after first cycle of therapy.  5.  Neutropenia: Related to chemotherapy and will monitor with subsequent visits.  Growth factor support may be required.    6.  Follow-up: In 2 weeks for the start of cycle 2 of therapy.   30  minutes were spent on this encounter.  Time was dedicated to reviewing laboratory data, disease status update and outlining future plan of care.          Jerome Button, MD 11/29/20227:58 AM

## 2021-11-27 ENCOUNTER — Other Ambulatory Visit: Payer: Self-pay

## 2021-11-27 ENCOUNTER — Inpatient Hospital Stay: Payer: BC Managed Care – PPO

## 2021-11-27 ENCOUNTER — Inpatient Hospital Stay: Payer: BC Managed Care – PPO | Attending: Oncology

## 2021-11-27 VITALS — BP 144/93 | HR 79 | Temp 98.2°F | Resp 18 | Wt 157.8 lb

## 2021-11-27 DIAGNOSIS — Z5111 Encounter for antineoplastic chemotherapy: Secondary | ICD-10-CM | POA: Insufficient documentation

## 2021-11-27 DIAGNOSIS — D701 Agranulocytosis secondary to cancer chemotherapy: Secondary | ICD-10-CM | POA: Diagnosis not present

## 2021-11-27 DIAGNOSIS — C679 Malignant neoplasm of bladder, unspecified: Secondary | ICD-10-CM

## 2021-11-27 DIAGNOSIS — Z95828 Presence of other vascular implants and grafts: Secondary | ICD-10-CM

## 2021-11-27 DIAGNOSIS — T451X5D Adverse effect of antineoplastic and immunosuppressive drugs, subsequent encounter: Secondary | ICD-10-CM | POA: Insufficient documentation

## 2021-11-27 DIAGNOSIS — Z79899 Other long term (current) drug therapy: Secondary | ICD-10-CM | POA: Insufficient documentation

## 2021-11-27 LAB — CBC WITH DIFFERENTIAL (CANCER CENTER ONLY)
Abs Immature Granulocytes: 0.05 10*3/uL (ref 0.00–0.07)
Basophils Absolute: 0 10*3/uL (ref 0.0–0.1)
Basophils Relative: 1 %
Eosinophils Absolute: 0 10*3/uL (ref 0.0–0.5)
Eosinophils Relative: 0 %
HCT: 31.9 % — ABNORMAL LOW (ref 39.0–52.0)
Hemoglobin: 11.3 g/dL — ABNORMAL LOW (ref 13.0–17.0)
Immature Granulocytes: 1 %
Lymphocytes Relative: 39 %
Lymphs Abs: 1.6 10*3/uL (ref 0.7–4.0)
MCH: 31.7 pg (ref 26.0–34.0)
MCHC: 35.4 g/dL (ref 30.0–36.0)
MCV: 89.6 fL (ref 80.0–100.0)
Monocytes Absolute: 0.3 10*3/uL (ref 0.1–1.0)
Monocytes Relative: 7 %
Neutro Abs: 2.2 10*3/uL (ref 1.7–7.7)
Neutrophils Relative %: 52 %
Platelet Count: 209 10*3/uL (ref 150–400)
RBC: 3.56 MIL/uL — ABNORMAL LOW (ref 4.22–5.81)
RDW: 12 % (ref 11.5–15.5)
WBC Count: 4.2 10*3/uL (ref 4.0–10.5)
nRBC: 0 % (ref 0.0–0.2)

## 2021-11-27 LAB — CMP (CANCER CENTER ONLY)
ALT: 71 U/L — ABNORMAL HIGH (ref 0–44)
AST: 38 U/L (ref 15–41)
Albumin: 3.8 g/dL (ref 3.5–5.0)
Alkaline Phosphatase: 53 U/L (ref 38–126)
Anion gap: 9 (ref 5–15)
BUN: 19 mg/dL (ref 8–23)
CO2: 25 mmol/L (ref 22–32)
Calcium: 8.7 mg/dL — ABNORMAL LOW (ref 8.9–10.3)
Chloride: 105 mmol/L (ref 98–111)
Creatinine: 0.98 mg/dL (ref 0.61–1.24)
GFR, Estimated: >60 mL/min (ref >60)
Glucose, Bld: 112 mg/dL — ABNORMAL HIGH (ref 70–99)
Potassium: 4 mmol/L (ref 3.5–5.1)
Sodium: 139 mmol/L (ref 135–145)
Total Bilirubin: 0.3 mg/dL (ref 0.3–1.2)
Total Protein: 7.2 g/dL (ref 6.5–8.1)

## 2021-11-27 MED ORDER — SODIUM CHLORIDE 0.9 % IV SOLN: Freq: Once | INTRAVENOUS | Status: AC

## 2021-11-27 MED ORDER — HEPARIN SOD (PORK) LOCK FLUSH 100 UNIT/ML IV SOLN
500.0000 [IU] | Freq: Once | INTRAVENOUS | Status: AC | PRN
  Administered 2021-11-27: 500 [IU]

## 2021-11-27 MED ORDER — PROCHLORPERAZINE MALEATE 10 MG PO TABS
10.0000 mg | ORAL_TABLET | Freq: Once | ORAL | Status: AC
  Administered 2021-11-27: 10 mg via ORAL
  Filled 2021-11-27: qty 1

## 2021-11-27 MED ORDER — SODIUM CHLORIDE 0.9 % IV SOLN
1000.0000 mg/m2 | Freq: Once | INTRAVENOUS | Status: AC
  Administered 2021-11-27: 1824 mg via INTRAVENOUS
  Filled 2021-11-27: qty 47.97

## 2021-11-27 MED ORDER — SODIUM CHLORIDE 0.9% FLUSH
10.0000 mL | INTRAVENOUS | Status: DC | PRN
  Administered 2021-11-27: 10 mL

## 2021-11-27 MED ORDER — SODIUM CHLORIDE 0.9% FLUSH
10.0000 mL | Freq: Once | INTRAVENOUS | Status: AC
Start: 2021-11-27 — End: 2021-11-27
  Administered 2021-11-27: 10 mL

## 2021-12-07 MED FILL — Dexamethasone Sodium Phosphate Inj 100 MG/10ML: INTRAMUSCULAR | Qty: 1 | Status: AC

## 2021-12-07 MED FILL — Fosaprepitant Dimeglumine For IV Infusion 150 MG (Base Eq): INTRAVENOUS | Qty: 5 | Status: AC

## 2021-12-10 ENCOUNTER — Inpatient Hospital Stay: Payer: BC Managed Care – PPO

## 2021-12-10 ENCOUNTER — Other Ambulatory Visit: Payer: Self-pay

## 2021-12-10 ENCOUNTER — Inpatient Hospital Stay (HOSPITAL_BASED_OUTPATIENT_CLINIC_OR_DEPARTMENT_OTHER): Payer: BC Managed Care – PPO | Admitting: Oncology

## 2021-12-10 VITALS — BP 172/98 | HR 78 | Temp 97.8°F | Resp 16 | Ht 67.0 in | Wt 168.1 lb

## 2021-12-10 DIAGNOSIS — C679 Malignant neoplasm of bladder, unspecified: Secondary | ICD-10-CM | POA: Diagnosis not present

## 2021-12-10 DIAGNOSIS — Z95828 Presence of other vascular implants and grafts: Secondary | ICD-10-CM

## 2021-12-10 DIAGNOSIS — Z5111 Encounter for antineoplastic chemotherapy: Secondary | ICD-10-CM | POA: Diagnosis not present

## 2021-12-10 DIAGNOSIS — T451X5D Adverse effect of antineoplastic and immunosuppressive drugs, subsequent encounter: Secondary | ICD-10-CM | POA: Diagnosis not present

## 2021-12-10 DIAGNOSIS — D701 Agranulocytosis secondary to cancer chemotherapy: Secondary | ICD-10-CM | POA: Diagnosis not present

## 2021-12-10 DIAGNOSIS — Z79899 Other long term (current) drug therapy: Secondary | ICD-10-CM | POA: Diagnosis not present

## 2021-12-10 LAB — CMP (CANCER CENTER ONLY)
ALT: 23 U/L (ref 0–44)
AST: 18 U/L (ref 15–41)
Albumin: 3.6 g/dL (ref 3.5–5.0)
Alkaline Phosphatase: 47 U/L (ref 38–126)
Anion gap: 9 (ref 5–15)
BUN: 14 mg/dL (ref 8–23)
CO2: 25 mmol/L (ref 22–32)
Calcium: 8.7 mg/dL — ABNORMAL LOW (ref 8.9–10.3)
Chloride: 107 mmol/L (ref 98–111)
Creatinine: 1.02 mg/dL (ref 0.61–1.24)
GFR, Estimated: >60 mL/min (ref >60)
Glucose, Bld: 121 mg/dL — ABNORMAL HIGH (ref 70–99)
Potassium: 3.8 mmol/L (ref 3.5–5.1)
Sodium: 141 mmol/L (ref 135–145)
Total Bilirubin: 0.3 mg/dL (ref 0.3–1.2)
Total Protein: 6.6 g/dL (ref 6.5–8.1)

## 2021-12-10 LAB — CBC WITH DIFFERENTIAL (CANCER CENTER ONLY)
Abs Immature Granulocytes: 0.05 10*3/uL (ref 0.00–0.07)
Basophils Absolute: 0 10*3/uL (ref 0.0–0.1)
Basophils Relative: 0 %
Eosinophils Absolute: 0.1 10*3/uL (ref 0.0–0.5)
Eosinophils Relative: 3 %
HCT: 29.3 % — ABNORMAL LOW (ref 39.0–52.0)
Hemoglobin: 10.1 g/dL — ABNORMAL LOW (ref 13.0–17.0)
Immature Granulocytes: 1 %
Lymphocytes Relative: 48 %
Lymphs Abs: 2.1 10*3/uL (ref 0.7–4.0)
MCH: 31.5 pg (ref 26.0–34.0)
MCHC: 34.5 g/dL (ref 30.0–36.0)
MCV: 91.3 fL (ref 80.0–100.0)
Monocytes Absolute: 0.8 10*3/uL (ref 0.1–1.0)
Monocytes Relative: 18 %
Neutro Abs: 1.3 10*3/uL — ABNORMAL LOW (ref 1.7–7.7)
Neutrophils Relative %: 30 %
Platelet Count: 141 10*3/uL — ABNORMAL LOW (ref 150–400)
RBC: 3.21 MIL/uL — ABNORMAL LOW (ref 4.22–5.81)
RDW: 14.3 % (ref 11.5–15.5)
WBC Count: 4.4 10*3/uL (ref 4.0–10.5)
nRBC: 0 % (ref 0.0–0.2)

## 2021-12-10 MED ORDER — SODIUM CHLORIDE 0.9 % IV SOLN
150.0000 mg | Freq: Once | INTRAVENOUS | Status: AC
  Administered 2021-12-10: 11:00:00 150 mg via INTRAVENOUS
  Filled 2021-12-10: qty 150

## 2021-12-10 MED ORDER — POTASSIUM CHLORIDE IN NACL 20-0.9 MEQ/L-% IV SOLN
Freq: Once | INTRAVENOUS | Status: AC
  Filled 2021-12-10: qty 1000

## 2021-12-10 MED ORDER — SODIUM CHLORIDE 0.9 % IV SOLN
1000.0000 mg/m2 | Freq: Once | INTRAVENOUS | Status: AC
  Administered 2021-12-10: 12:00:00 1824 mg via INTRAVENOUS
  Filled 2021-12-10: qty 47.97

## 2021-12-10 MED ORDER — SODIUM CHLORIDE 0.9 % IV SOLN: Freq: Once | INTRAVENOUS | Status: AC

## 2021-12-10 MED ORDER — HEPARIN SOD (PORK) LOCK FLUSH 100 UNIT/ML IV SOLN
500.0000 [IU] | Freq: Once | INTRAVENOUS | Status: AC | PRN
  Administered 2021-12-10: 15:00:00 500 [IU]

## 2021-12-10 MED ORDER — PALONOSETRON HCL INJECTION 0.25 MG/5ML
0.2500 mg | Freq: Once | INTRAVENOUS | Status: AC
  Administered 2021-12-10: 11:00:00 0.25 mg via INTRAVENOUS
  Filled 2021-12-10: qty 5

## 2021-12-10 MED ORDER — SODIUM CHLORIDE 0.9 % IV SOLN
10.0000 mg | Freq: Once | INTRAVENOUS | Status: AC
  Administered 2021-12-10: 11:00:00 10 mg via INTRAVENOUS
  Filled 2021-12-10: qty 10

## 2021-12-10 MED ORDER — SODIUM CHLORIDE 0.9 % IV SOLN
70.0000 mg/m2 | Freq: Once | INTRAVENOUS | Status: AC
  Administered 2021-12-10: 13:00:00 129 mg via INTRAVENOUS
  Filled 2021-12-10: qty 129

## 2021-12-10 MED ORDER — SODIUM CHLORIDE 0.9% FLUSH
10.0000 mL | INTRAVENOUS | Status: DC | PRN
  Administered 2021-12-10: 15:00:00 10 mL

## 2021-12-10 MED ORDER — MAGNESIUM SULFATE 2 GM/50ML IV SOLN
2.0000 g | Freq: Once | INTRAVENOUS | Status: AC
  Administered 2021-12-10: 09:00:00 2 g via INTRAVENOUS
  Filled 2021-12-10: qty 50

## 2021-12-10 MED ORDER — SODIUM CHLORIDE 0.9% FLUSH
10.0000 mL | Freq: Once | INTRAVENOUS | Status: AC
  Administered 2021-12-10: 08:00:00 10 mL

## 2021-12-10 NOTE — Progress Notes (Signed)
Nutrition Assessment    ASSESSMENT:  65 year old male with bladder cancer.  Past medical history of HTN, GERD.  Patient receiving chemotherapy.    Met with patient during infusion.  Patient reports that his appetite is good overall.  Does report that for few days after treatment appetite is decreased.  "Some days nothing looks appealing.  Has been trying to eat salmon, tuna, nuts.  Likes butter pecan ice cream for bedtime snack.  Reports some nausea.    Medications:  compazine   Labs: reviewed   Anthropometrics:   Height: 67 inches Weight: 168 lb 1.6 oz today UBW: 155-160 lb BMI: 26  Weight gain recently.  Wearing jacket and boots today during weight measurement   Estimated Energy Needs  Kcals: 1900-2200 Protein: 95-110 g Fluid: 1.9 L   NUTRITION DIAGNOSIS: none at this time   INTERVENTION:  Discussed importance of good nutrition during treatment. Reviewed importance of including good sources of protein and plant foods Encouraged being proactive with nausea medication    MONITORING, EVALUATION, GOAL: weight trends, intake   Next Visit: Tuesday, [name] 17 during infusion  Travius Crochet B. Zenia Resides, [place], [place] Registered Dietitian [phone] (mobile)

## 2021-12-10 NOTE — Progress Notes (Signed)
Hematology and Oncology Follow Up Visit  Jerome Cruz [id number] 03-13-1956 65 y.o. 12/10/2021 7:49 AM Jerome Cruz, [name], Jerome Guadeloupe, MD   Principle Diagnosis: 65 year old with T2N0 high-grade urothelial carcinoma of the bladder diagnosed in September 2022.     Prior Therapy:  He is status post TURBT in September 26, 2021.  Final pathology showed infiltrating high-grade papillary urothelial carcinoma that is poorly differentiated and 80% with a glandular component at 5%.   Current therapy: Neoadjuvant chemotherapy utilizing gemcitabine and cisplatin started on October 30, 2021.  He is here for day 1 cycle 3.  Interim History: Mr. Jerome Cruz presents today for repeat follow-up.  Since the last visit, he reports no major changes in his health.  He denies any recent hospitalizations or illnesses.  He denies any recent complaints related to chemotherapy.  He denies any nausea, vomiting or abdominal pain.  He denies any worsening neuropathy or excessive fatigue.  He denies any complications related to his Port-A-Cath.    Medications: Updated on review. Current Outpatient Medications  Medication Sig Dispense Refill   esomeprazole (NEXIUM) 20 MG capsule Take 20 mg by mouth daily at 12 noon.     lidocaine-prilocaine (EMLA) cream Apply 1 application topically as needed. 30 g 0   oxybutynin (DITROPAN) 5 MG tablet Take 1 tablet (5 mg total) by mouth every 8 (eight) hours as needed for bladder spasms. 30 tablet 3   prochlorperazine (COMPAZINE) 10 MG tablet Take 1 tablet (10 mg total) by mouth every 6 (six) hours as needed for nausea or vomiting. 30 tablet 0   simvastatin (ZOCOR) 20 MG tablet Take 20 mg by mouth at bedtime.     traMADol (ULTRAM) 50 MG tablet Take 1 tablet (50 mg total) by mouth every 6 (six) hours as needed. 20 tablet 0   valsartan (DIOVAN) 80 MG tablet Take 80 mg by mouth every evening.     Zoster Vaccine Adjuvanted [facility]) injection Shingrix (PF) 50 mcg/0.5 mL intramuscular  suspension, kit  ADM 0.5ML IM UTD     No current facility-administered medications for this visit.   Facility-Administered Medications Ordered in Other Visits  Medication Dose Route Frequency Provider Last Rate Last Admin   gemcitabine (GEMZAR) chemo syringe for bladder instillation 2,000 mg  2,000 mg Bladder Instillation Once Robley Fries, MD         Allergies: No Known Allergies    Physical Exam: Blood pressure (!) 172/98, pulse 78, temperature 97.8 F (36.6 C), temperature source Temporal, resp. rate 16, height 5' 7"  (1.702 m), weight 168 lb 1.6 oz (76.2 kg), SpO2 97 %.  ECOG: 0   General appearance: Comfortable appearing without any discomfort Head: Normocephalic without any trauma Oropharynx: Mucous membranes are moist and pink without any thrush or ulcers. Eyes: Pupils are equal and round reactive to light. Lymph nodes: No cervical, supraclavicular, inguinal or axillary lymphadenopathy.   Heart:regular rate and rhythm.  S1 and S2 without leg edema. Lung: Clear without any rhonchi or wheezes.  No dullness to percussion. Abdomin: Soft, nontender, nondistended with good bowel sounds.  No hepatosplenomegaly. Musculoskeletal: No joint deformity or effusion.  Full range of motion noted. Neurological: No deficits noted on motor, sensory and deep tendon reflex exam. Skin: No petechial rash or dryness.  Appeared moist.        Lab Results: Lab Results  Component Value Date   WBC 4.2 11/27/2021   HGB 11.3 (L) 11/27/2021   HCT 31.9 (L) 11/27/2021   MCV 89.6 11/27/2021  PLT 209 11/27/2021     Chemistry      Component Value Date/Time   NA 139 11/27/2021 1005   K 4.0 11/27/2021 1005   CL 105 11/27/2021 1005   CO2 25 11/27/2021 1005   BUN 19 11/27/2021 1005   CREATININE 0.98 11/27/2021 1005      Component Value Date/Time   CALCIUM 8.7 (L) 11/27/2021 1005   ALKPHOS 53 11/27/2021 1005   AST 38 11/27/2021 1005   ALT 71 (H) 11/27/2021 1005   BILITOT 0.3  11/27/2021 1005          Impression and Plan:  65 year old with:  1. T2N0 high-grade papillary urothelial carcinoma of the bladder diagnosed in September 2022.   He is currently receiving neoadjuvant chemotherapy and completed 2 cycles.  Risks and benefits of proceeding with cycle 3 were discussed.  Complications that include nausea, vomiting, myelosuppression, neutropenia and possible sepsis were reiterated.  He is agreeable to proceed and the plan is to complete 4 cycles if possible.  Laboratory data from today reviewed and showed adequate hematological parameters and ready to proceed.     2.  IV access: Port-A-Cath currently in place without any issues.   3.  Antiemetics: Compazine is available to him without any nausea or vomiting.   4.  Renal function surveillance: Creatinine clearance remains normal displaced platinum based therapy.   5.  Neutropenia: Absolute neutrophil count remains adequate at this time we will continue to monitor.    6.  Follow-up: He will return in 1 week to complete cycle 3 and in 3 weeks for the start of cycle 4.   30  minutes were dedicated to this visit.  The time was spent on reviewing laboratory data, disease status update, addressing complication related to cancer and cancer therapy.          Zola Button, MD 12/19/20227:49 AM

## 2021-12-18 ENCOUNTER — Other Ambulatory Visit: Payer: No Typology Code available for payment source

## 2021-12-18 ENCOUNTER — Other Ambulatory Visit: Payer: Self-pay

## 2021-12-18 ENCOUNTER — Inpatient Hospital Stay: Payer: BC Managed Care – PPO

## 2021-12-18 VITALS — BP 160/99 | HR 92 | Temp 98.5°F | Resp 18

## 2021-12-18 DIAGNOSIS — Z5111 Encounter for antineoplastic chemotherapy: Secondary | ICD-10-CM | POA: Diagnosis not present

## 2021-12-18 DIAGNOSIS — Z95828 Presence of other vascular implants and grafts: Secondary | ICD-10-CM

## 2021-12-18 DIAGNOSIS — C679 Malignant neoplasm of bladder, unspecified: Secondary | ICD-10-CM

## 2021-12-18 DIAGNOSIS — D701 Agranulocytosis secondary to cancer chemotherapy: Secondary | ICD-10-CM | POA: Diagnosis not present

## 2021-12-18 DIAGNOSIS — T451X5D Adverse effect of antineoplastic and immunosuppressive drugs, subsequent encounter: Secondary | ICD-10-CM | POA: Diagnosis not present

## 2021-12-18 DIAGNOSIS — Z79899 Other long term (current) drug therapy: Secondary | ICD-10-CM | POA: Diagnosis not present

## 2021-12-18 LAB — CMP (CANCER CENTER ONLY)
ALT: 32 U/L (ref 0–44)
AST: 21 U/L (ref 15–41)
Albumin: 4 g/dL (ref 3.5–5.0)
Alkaline Phosphatase: 49 U/L (ref 38–126)
Anion gap: 6 (ref 5–15)
BUN: 18 mg/dL (ref 8–23)
CO2: 26 mmol/L (ref 22–32)
Calcium: 8.9 mg/dL (ref 8.9–10.3)
Chloride: 106 mmol/L (ref 98–111)
Creatinine: 0.99 mg/dL (ref 0.61–1.24)
GFR, Estimated: >60 mL/min (ref >60)
Glucose, Bld: 134 mg/dL — ABNORMAL HIGH (ref 70–99)
Potassium: 4.2 mmol/L (ref 3.5–5.1)
Sodium: 138 mmol/L (ref 135–145)
Total Bilirubin: 0.2 mg/dL — ABNORMAL LOW (ref 0.3–1.2)
Total Protein: 6.6 g/dL (ref 6.5–8.1)

## 2021-12-18 LAB — CBC WITH DIFFERENTIAL (CANCER CENTER ONLY)
Abs Immature Granulocytes: 0.04 10*3/uL (ref 0.00–0.07)
Basophils Absolute: 0.1 10*3/uL (ref 0.0–0.1)
Basophils Relative: 1 %
Eosinophils Absolute: 0.1 10*3/uL (ref 0.0–0.5)
Eosinophils Relative: 1 %
HCT: 27.4 % — ABNORMAL LOW (ref 39.0–52.0)
Hemoglobin: 9.7 g/dL — ABNORMAL LOW (ref 13.0–17.0)
Immature Granulocytes: 1 %
Lymphocytes Relative: 39 %
Lymphs Abs: 1.7 10*3/uL (ref 0.7–4.0)
MCH: 32.4 pg (ref 26.0–34.0)
MCHC: 35.4 g/dL (ref 30.0–36.0)
MCV: 91.6 fL (ref 80.0–100.0)
Monocytes Absolute: 0.3 10*3/uL (ref 0.1–1.0)
Monocytes Relative: 7 %
Neutro Abs: 2.2 10*3/uL (ref 1.7–7.7)
Neutrophils Relative %: 51 %
Platelet Count: 139 10*3/uL — ABNORMAL LOW (ref 150–400)
RBC: 2.99 MIL/uL — ABNORMAL LOW (ref 4.22–5.81)
RDW: 14.3 % (ref 11.5–15.5)
WBC Count: 4.4 10*3/uL (ref 4.0–10.5)
nRBC: 0 % (ref 0.0–0.2)

## 2021-12-18 MED ORDER — SODIUM CHLORIDE 0.9% FLUSH
10.0000 mL | Freq: Once | INTRAVENOUS | Status: AC
  Administered 2021-12-18: 11:00:00 10 mL

## 2021-12-18 MED ORDER — PROCHLORPERAZINE MALEATE 10 MG PO TABS
10.0000 mg | ORAL_TABLET | Freq: Once | ORAL | Status: AC
  Administered 2021-12-18: 12:00:00 10 mg via ORAL
  Filled 2021-12-18: qty 1

## 2021-12-18 MED ORDER — SODIUM CHLORIDE 0.9% FLUSH
10.0000 mL | INTRAVENOUS | Status: DC | PRN
  Administered 2021-12-18: 14:00:00 10 mL

## 2021-12-18 MED ORDER — HEPARIN SOD (PORK) LOCK FLUSH 100 UNIT/ML IV SOLN
500.0000 [IU] | Freq: Once | INTRAVENOUS | Status: AC | PRN
  Administered 2021-12-18: 14:00:00 500 [IU]

## 2021-12-18 MED ORDER — SODIUM CHLORIDE 0.9 % IV SOLN
1000.0000 mg/m2 | Freq: Once | INTRAVENOUS | Status: AC
  Administered 2021-12-18: 13:00:00 1824 mg via INTRAVENOUS
  Filled 2021-12-18: qty 47.97

## 2021-12-18 MED ORDER — SODIUM CHLORIDE 0.9 % IV SOLN: Freq: Once | INTRAVENOUS | Status: AC

## 2021-12-18 NOTE — Patient Instructions (Signed)
[facility]   ?Discharge Instructions: ?Thank you for choosing [facility] to provide your oncology and hematology care.  ? ?If you have a lab appointment with the Cancer Center, please go directly to the Cancer Center and check in at the registration area. ?  ?Wear comfortable clothing and clothing appropriate for easy access to any Portacath or PICC line.  ? ?We strive to give you quality time with your provider. You may need to reschedule your appointment if you arrive late (15 or more minutes).  Arriving late affects you and other patients whose appointments are after yours.  Also, if you miss three or more appointments without notifying the office, you may be dismissed from the clinic at the provider?s discretion.    ?  ?For prescription refill requests, have your pharmacy contact our office and allow 72 hours for refills to be completed.   ? ?Today you received the following chemotherapy and/or immunotherapy agents: gemcitabine    ?  ?To help prevent nausea and vomiting after your treatment, we encourage you to take your nausea medication as directed. ? ?BELOW ARE SYMPTOMS THAT SHOULD BE REPORTED IMMEDIATELY: ?*FEVER GREATER THAN 100.4 F (38 ?C) OR HIGHER ?*CHILLS OR SWEATING ?*NAUSEA AND VOMITING THAT IS NOT CONTROLLED WITH YOUR NAUSEA MEDICATION ?*UNUSUAL SHORTNESS OF BREATH ?*UNUSUAL BRUISING OR BLEEDING ?*URINARY PROBLEMS (pain or burning when urinating, or frequent urination) ?*BOWEL PROBLEMS (unusual diarrhea, constipation, pain near the anus) ?TENDERNESS IN MOUTH AND THROAT WITH OR WITHOUT PRESENCE OF ULCERS (sore throat, sores in mouth, or a toothache) ?UNUSUAL RASH, SWELLING OR PAIN  ?UNUSUAL VAGINAL DISCHARGE OR ITCHING  ? ?Items with * indicate a potential emergency and should be followed up as soon as possible or go to the Emergency Department if any problems should occur. ? ?Please show the CHEMOTHERAPY ALERT CARD or IMMUNOTHERAPY ALERT CARD at check-in  to the Emergency Department and triage nurse. ? ?Should you have questions after your visit or need to cancel or reschedule your appointment, please contact [facility]  Dept: [phone]  and follow the prompts.  Office hours are 8:00 a.m. to 4:30 p.m. Monday - Friday. Please note that voicemails left after 4:00 p.m. may not be returned until the following business day.  We are closed weekends and major holidays. You have access to a nurse at all times for urgent questions. Please call the main number to the clinic Dept: [phone] and follow the prompts. ? ? ?For any non-urgent questions, you may also contact your provider using MyChart. We now offer e-Visits for anyone 18 and older to request care online for non-urgent symptoms. For details visit [email or web address]. ?  ?Also download the MyChart app! Go to the app store, search "MyChart", open the app, select [facility] , and log in with your MyChart username and password. ? ?Due to Covid, a mask is required upon entering the hospital/clinic. If you do not have a mask, one will be given to you upon arrival. For doctor visits, patients may have 1 support person aged 18 or older with them. For treatment visits, patients cannot have anyone with them due to current Covid guidelines and our immunocompromised population.  ? ?

## 2021-12-25 ENCOUNTER — Encounter: Payer: Self-pay | Admitting: Oncology

## 2021-12-31 ENCOUNTER — Encounter: Payer: Self-pay | Admitting: Oncology

## 2021-12-31 MED FILL — Dexamethasone Sodium Phosphate Inj 100 MG/10ML: INTRAMUSCULAR | Qty: 1 | Status: AC

## 2022-01-01 ENCOUNTER — Inpatient Hospital Stay: Payer: BC Managed Care – PPO | Attending: Oncology

## 2022-01-01 ENCOUNTER — Inpatient Hospital Stay: Payer: BC Managed Care – PPO

## 2022-01-01 ENCOUNTER — Inpatient Hospital Stay: Payer: BC Managed Care – PPO | Admitting: Oncology

## 2022-01-01 ENCOUNTER — Other Ambulatory Visit: Payer: Self-pay

## 2022-01-01 VITALS — BP 179/92 | HR 88 | Temp 98.2°F | Resp 17 | Ht 67.0 in | Wt 172.1 lb

## 2022-01-01 DIAGNOSIS — T451X5D Adverse effect of antineoplastic and immunosuppressive drugs, subsequent encounter: Secondary | ICD-10-CM | POA: Insufficient documentation

## 2022-01-01 DIAGNOSIS — Z5111 Encounter for antineoplastic chemotherapy: Secondary | ICD-10-CM | POA: Insufficient documentation

## 2022-01-01 DIAGNOSIS — Z79899 Other long term (current) drug therapy: Secondary | ICD-10-CM | POA: Insufficient documentation

## 2022-01-01 DIAGNOSIS — Z95828 Presence of other vascular implants and grafts: Secondary | ICD-10-CM

## 2022-01-01 DIAGNOSIS — C679 Malignant neoplasm of bladder, unspecified: Secondary | ICD-10-CM | POA: Diagnosis not present

## 2022-01-01 DIAGNOSIS — D701 Agranulocytosis secondary to cancer chemotherapy: Secondary | ICD-10-CM | POA: Diagnosis not present

## 2022-01-01 LAB — CMP (CANCER CENTER ONLY)
ALT: 15 U/L (ref 0–44)
AST: 16 U/L (ref 15–41)
Albumin: 3.8 g/dL (ref 3.5–5.0)
Alkaline Phosphatase: 43 U/L (ref 38–126)
Anion gap: 7 (ref 5–15)
BUN: 14 mg/dL (ref 8–23)
CO2: 26 mmol/L (ref 22–32)
Calcium: 8.5 mg/dL — ABNORMAL LOW (ref 8.9–10.3)
Chloride: 106 mmol/L (ref 98–111)
Creatinine: 1 mg/dL (ref 0.61–1.24)
GFR, Estimated: >60 mL/min (ref >60)
Glucose, Bld: 118 mg/dL — ABNORMAL HIGH (ref 70–99)
Potassium: 3.9 mmol/L (ref 3.5–5.1)
Sodium: 139 mmol/L (ref 135–145)
Total Bilirubin: 0.3 mg/dL (ref 0.3–1.2)
Total Protein: 6.2 g/dL — ABNORMAL LOW (ref 6.5–8.1)

## 2022-01-01 LAB — MAGNESIUM: Magnesium: 1.7 mg/dL (ref 1.7–2.4)

## 2022-01-01 LAB — CBC WITH DIFFERENTIAL (CANCER CENTER ONLY)
Abs Immature Granulocytes: 0.03 10*3/uL (ref 0.00–0.07)
Basophils Absolute: 0 10*3/uL (ref 0.0–0.1)
Basophils Relative: 0 %
Eosinophils Absolute: 0.1 10*3/uL (ref 0.0–0.5)
Eosinophils Relative: 3 %
HCT: 26.2 % — ABNORMAL LOW (ref 39.0–52.0)
Hemoglobin: 9.1 g/dL — ABNORMAL LOW (ref 13.0–17.0)
Immature Granulocytes: 1 %
Lymphocytes Relative: 37 %
Lymphs Abs: 1.4 10*3/uL (ref 0.7–4.0)
MCH: 33.3 pg (ref 26.0–34.0)
MCHC: 34.7 g/dL (ref 30.0–36.0)
MCV: 96 fL (ref 80.0–100.0)
Monocytes Absolute: 0.7 10*3/uL (ref 0.1–1.0)
Monocytes Relative: 18 %
Neutro Abs: 1.6 10*3/uL — ABNORMAL LOW (ref 1.7–7.7)
Neutrophils Relative %: 41 %
Platelet Count: 160 10*3/uL (ref 150–400)
RBC: 2.73 MIL/uL — ABNORMAL LOW (ref 4.22–5.81)
RDW: 18.4 % — ABNORMAL HIGH (ref 11.5–15.5)
WBC Count: 3.8 10*3/uL — ABNORMAL LOW (ref 4.0–10.5)
nRBC: 0 % (ref 0.0–0.2)

## 2022-01-01 MED ORDER — HEPARIN SOD (PORK) LOCK FLUSH 100 UNIT/ML IV SOLN
500.0000 [IU] | Freq: Once | INTRAVENOUS | Status: AC | PRN
  Administered 2022-01-01: 500 [IU]

## 2022-01-01 MED ORDER — PALONOSETRON HCL INJECTION 0.25 MG/5ML
0.2500 mg | Freq: Once | INTRAVENOUS | Status: AC
  Administered 2022-01-01: 0.25 mg via INTRAVENOUS
  Filled 2022-01-01: qty 5

## 2022-01-01 MED ORDER — SODIUM CHLORIDE 0.9% FLUSH
10.0000 mL | INTRAVENOUS | Status: DC | PRN
  Administered 2022-01-01: 10 mL

## 2022-01-01 MED ORDER — SODIUM CHLORIDE 0.9 % IV SOLN
150.0000 mg | Freq: Once | INTRAVENOUS | Status: AC
  Administered 2022-01-01: 150 mg via INTRAVENOUS
  Filled 2022-01-01: qty 5

## 2022-01-01 MED ORDER — SODIUM CHLORIDE 0.9% FLUSH
10.0000 mL | Freq: Once | INTRAVENOUS | Status: AC
  Administered 2022-01-01: 10 mL

## 2022-01-01 MED ORDER — POTASSIUM CHLORIDE IN NACL 20-0.9 MEQ/L-% IV SOLN
Freq: Once | INTRAVENOUS | Status: AC
  Filled 2022-01-01: qty 1000

## 2022-01-01 MED ORDER — MAGNESIUM SULFATE 2 GM/50ML IV SOLN
2.0000 g | Freq: Once | INTRAVENOUS | Status: AC
  Administered 2022-01-01: 2 g via INTRAVENOUS
  Filled 2022-01-01: qty 50

## 2022-01-01 MED ORDER — SODIUM CHLORIDE 0.9 % IV SOLN
10.0000 mg | Freq: Once | INTRAVENOUS | Status: AC
  Administered 2022-01-01: 10 mg via INTRAVENOUS
  Filled 2022-01-01: qty 10

## 2022-01-01 MED ORDER — SODIUM CHLORIDE 0.9 % IV SOLN: Freq: Once | INTRAVENOUS | Status: AC

## 2022-01-01 MED ORDER — SODIUM CHLORIDE 0.9 % IV SOLN
70.0000 mg/m2 | Freq: Once | INTRAVENOUS | Status: AC
  Administered 2022-01-01: 129 mg via INTRAVENOUS
  Filled 2022-01-01: qty 129

## 2022-01-01 MED ORDER — SODIUM CHLORIDE 0.9 % IV SOLN
1000.0000 mg/m2 | Freq: Once | INTRAVENOUS | Status: AC
  Administered 2022-01-01: 1824 mg via INTRAVENOUS
  Filled 2022-01-01: qty 47.97

## 2022-01-01 NOTE — Progress Notes (Signed)
Hematology and Oncology Follow Up Visit  Jerome Cruz [id number] [birth year] 66 y.o. 01/01/2022 8:15 AM Jerome Cruz, [name], [name], MD   Principle Diagnosis: 66 year old man with bladder cancer diagnosed in September 2022.  He was found to have T2N0 high-grade urothelial carcinoma.   Prior Therapy:  He is status post TURBT in September 26, 2021.  Final pathology showed infiltrating high-grade papillary urothelial carcinoma that is poorly differentiated and 80% with a glandular component at 5%.   Current therapy: Neoadjuvant chemotherapy utilizing gemcitabine and cisplatin started on October 30, 2021.  He is here for day 1 cycle 4.  Interim History: Mr. Jerome Cruz returns today for repeat evaluation.  Since last visit, he reports no major changes in his health.  He has reported some mild nausea but no vomiting.  He denies any abdominal pain or worsening neuropathy.  He denies any hospitalizations or illnesses.  He denies any excessive fatigue or tiredness and still able to perform activities of daily living.    Medications: Reviewed without changes. Current Outpatient Medications  Medication Sig Dispense Refill   esomeprazole (NEXIUM) 20 MG capsule Take 20 mg by mouth daily at 12 noon.     lidocaine-prilocaine (EMLA) cream Apply 1 application topically as needed. 30 g 0   oxybutynin (DITROPAN) 5 MG tablet Take 1 tablet (5 mg total) by mouth every 8 (eight) hours as needed for bladder spasms. 30 tablet 3   prochlorperazine (COMPAZINE) 10 MG tablet Take 1 tablet (10 mg total) by mouth every 6 (six) hours as needed for nausea or vomiting. 30 tablet 0   simvastatin (ZOCOR) 20 MG tablet Take 20 mg by mouth at bedtime.     traMADol (ULTRAM) 50 MG tablet Take 1 tablet (50 mg total) by mouth every 6 (six) hours as needed. 20 tablet 0   valsartan (DIOVAN) 80 MG tablet Take 80 mg by mouth every evening.     Zoster Vaccine Adjuvanted [facility]) injection Shingrix (PF) 50 mcg/0.5 mL intramuscular  suspension, kit  ADM 0.5ML IM UTD     No current facility-administered medications for this visit.   Facility-Administered Medications Ordered in Other Visits  Medication Dose Route Frequency Provider Last Rate Last Admin   gemcitabine (GEMZAR) chemo syringe for bladder instillation 2,000 mg  2,000 mg Bladder Instillation Once Jerome Fries, MD         Allergies:  Allergies  Allergen Reactions   Atorvastatin Other (See Comments)   Irbesartan Other (See Comments)      Physical Exam: Blood pressure (!) 179/92, pulse 88, temperature 98.2 F (36.8 C), temperature source Temporal, resp. rate 17, height _0  (1.702 m), weight 172 lb 1.6 oz (78.1 kg), SpO2 99 %.  ECOG: 0   General appearance: Alert, awake without any distress. Head: Atraumatic without abnormalities Oropharynx: Without any thrush or ulcers. Eyes: No scleral icterus. Lymph nodes: No lymphadenopathy noted in the cervical, supraclavicular, or axillary nodes Heart:regular rate and rhythm, without any murmurs or gallops.   Lung: Clear to auscultation without any rhonchi, wheezes or dullness to percussion. Abdomin: Soft, nontender without any shifting dullness or ascites. Musculoskeletal: No clubbing or cyanosis. Neurological: No motor or sensory deficits. Skin: No rashes or lesions.        Lab Results: Lab Results  Component Value Date   WBC 4.4 12/18/2021   HGB 9.7 (L) 12/18/2021   HCT 27.4 (L) 12/18/2021   MCV 91.6 12/18/2021   PLT 139 (L) 12/18/2021     Chemistry  Component Value Date/Time   NA 138 12/18/2021 1117   K 4.2 12/18/2021 1117   CL 106 12/18/2021 1117   CO2 26 12/18/2021 1117   BUN 18 12/18/2021 1117   CREATININE 0.99 12/18/2021 1117      Component Value Date/Time   CALCIUM 8.9 12/18/2021 1117   ALKPHOS 49 12/18/2021 1117   AST 21 12/18/2021 1117   ALT 32 12/18/2021 1117   BILITOT 0.2 (L) 12/18/2021 1117          Impression and Plan:  66 year old with:  1.   Bladder cancer diagnosed in September 2022.  He was found to have T2N0 high-grade papillary urothelial carcinoma without any evidence of metastatic disease.  Risks and benefits of continuing the current treatment approach were reviewed.  He has tolerated the chemotherapy without any major complaints and willing to proceed with the last cycle of therapy.  Upon completing this cycle we will undergo staging scans and subsequently proceed with radical cystectomy evaluation by Dr. Tresa Cruz.     2.  IV access: Port-A-Cath remains in use without any issues.   3.  Antiemetics: He does have some mild nausea but no vomiting.  Compazine is available to him.   4.  Renal function surveillance: Creatinine clearance remains normal on platinum based therapy.   5.  Neutropenia: Related to chemotherapy with absolute neutrophil count is adequate.  We will proceed without any dose reduction or delay.  6.  Anemia: Related to his chemotherapy.  He is asymptomatic and likely will recover upon completing therapy.    7.  Follow-up: He will return in 4 weeks for repeat evaluation and updating his staging scans.   30  minutes were spent on this encounter.  The time was dedicated to reviewing laboratory data, disease status update and management options for the future.          Jerome Button, MD 1/10/20238:15 AM

## 2022-01-01 NOTE — Patient Instructions (Signed)
[facility]  Discharge Instructions: Thank you for choosing [facility] to provide your oncology and hematology care.   If you have a lab appointment with the [facility], please go directly to the [facility] and check in at the registration area.   Wear comfortable clothing and clothing appropriate for easy access to any Portacath or PICC line.   We strive to give you quality time with your provider. You may need to reschedule your appointment if you arrive late (15 or more minutes).  Arriving late affects you and other patients whose appointments are after yours.  Also, if you miss three or more appointments without notifying the office, you may be dismissed from the clinic at the providers discretion.      For prescription refill requests, have your pharmacy contact our office and allow 72 hours for refills to be completed.    Today you received the following chemotherapy and/or immunotherapy agents cisplatin, gemcitabine       To help prevent nausea and vomiting after your treatment, we encourage you to take your nausea medication as directed.  BELOW ARE SYMPTOMS THAT SHOULD BE REPORTED IMMEDIATELY: *FEVER GREATER THAN 100.4 F (38 C) OR HIGHER *CHILLS OR SWEATING *NAUSEA AND VOMITING THAT IS NOT CONTROLLED WITH YOUR NAUSEA MEDICATION *UNUSUAL SHORTNESS OF BREATH *UNUSUAL BRUISING OR BLEEDING *URINARY PROBLEMS (pain or burning when urinating, or frequent urination) *BOWEL PROBLEMS (unusual diarrhea, constipation, pain near the anus) TENDERNESS IN MOUTH AND THROAT WITH OR WITHOUT PRESENCE OF ULCERS (sore throat, sores in mouth, or a toothache) UNUSUAL RASH, SWELLING OR PAIN  UNUSUAL VAGINAL DISCHARGE OR ITCHING   Items with * indicate a potential emergency and should be followed up as soon as possible or go to the Emergency Department if any problems should occur.  Please show the CHEMOTHERAPY ALERT CARD or IMMUNOTHERAPY ALERT CARD  at check-in to the Emergency Department and triage nurse.  Should you have questions after your visit or need to cancel or reschedule your appointment, please contact [facility]  Dept: [phone]  and follow the prompts.  Office hours are 8:00 a.m. to 4:30 p.m. Monday - Friday. Please note that voicemails left after 4:00 p.m. may not be returned until the following business day.  We are closed weekends and major holidays. You have access to a nurse at all times for urgent questions. Please call the main number to the clinic Dept: [phone] and follow the prompts.   For any non-urgent questions, you may also contact your provider using MyChart. We now offer e-Visits for anyone 61 and older to request care online for non-urgent symptoms. For details visit [email or web address].   Also download the MyChart app! Go to the app store, search "MyChart", open the app, select [facility], and log in with your MyChart username and password.  Due to Covid, a mask is required upon entering the hospital/clinic. If you do not have a mask, one will be given to you upon arrival. For doctor visits, patients may have 1 support person aged 8 or older with them. For treatment visits, patients cannot have anyone with them due to current Covid guidelines and our immunocompromised population.

## 2022-01-03 ENCOUNTER — Emergency Department (HOSPITAL_COMMUNITY)
Admission: EM | Admit: 2022-01-03 | Discharge: 2022-01-03 | Disposition: A | Discharge status: Home / Self Care | Payer: BC Managed Care – PPO | Attending: Emergency Medicine | Admitting: Emergency Medicine

## 2022-01-03 ENCOUNTER — Telehealth: Payer: Self-pay | Admitting: *Deleted

## 2022-01-03 DIAGNOSIS — R202 Paresthesia of skin: Secondary | ICD-10-CM | POA: Diagnosis not present

## 2022-01-03 DIAGNOSIS — Z5321 Procedure and treatment not carried out due to patient leaving prior to being seen by health care provider: Secondary | ICD-10-CM | POA: Insufficient documentation

## 2022-01-03 DIAGNOSIS — C801 Malignant (primary) neoplasm, unspecified: Secondary | ICD-10-CM | POA: Diagnosis not present

## 2022-01-03 DIAGNOSIS — I10 Essential (primary) hypertension: Secondary | ICD-10-CM | POA: Diagnosis not present

## 2022-01-03 NOTE — Telephone Encounter (Signed)
-----   Message from Wyatt Portela, MD sent at 01/03/2022  3:02 PM EST ----- He needs to see his PCP ----- Message ----- From: Rolene Course, RN Sent: 01/03/2022   2:34 PM EST To: Wyatt Portela, MD  Mr Jerome Cruz called & said he is concerned about his BP.  It is 170/100 at home, he is on valsartan which he says he has been taking every evening.  He is usually hypertensive here, but he said his BP is usually better at home.  He denies any HA or dizziness, but says his vision is slightly blurred & he feels some tingling in his left arm.  He called his PCP who prescribes the valsartan & was advised to take another tablet, which he did but is still hypertensive.  I instructed him to consult with his PCP again since taking the extra tablet.  What is your advice?  Thanks, Bethena Roys

## 2022-01-03 NOTE — Telephone Encounter (Signed)
PC to patient, no answer, left VM - informed patient he needs to see his PCP as soon as possible for his hypertension.  Advised patient to call this office with any further questions/concerns, phone number given.

## 2022-01-08 ENCOUNTER — Inpatient Hospital Stay: Payer: BC Managed Care – PPO

## 2022-01-08 ENCOUNTER — Other Ambulatory Visit: Payer: Self-pay

## 2022-01-08 ENCOUNTER — Inpatient Hospital Stay: Payer: BC Managed Care – PPO | Admitting: Dietician

## 2022-01-08 VITALS — BP 156/93 | HR 97 | Temp 98.2°F | Resp 17

## 2022-01-08 DIAGNOSIS — Z79899 Other long term (current) drug therapy: Secondary | ICD-10-CM | POA: Diagnosis not present

## 2022-01-08 DIAGNOSIS — C679 Malignant neoplasm of bladder, unspecified: Secondary | ICD-10-CM | POA: Diagnosis not present

## 2022-01-08 DIAGNOSIS — T451X5D Adverse effect of antineoplastic and immunosuppressive drugs, subsequent encounter: Secondary | ICD-10-CM | POA: Diagnosis not present

## 2022-01-08 DIAGNOSIS — Z5111 Encounter for antineoplastic chemotherapy: Secondary | ICD-10-CM | POA: Diagnosis not present

## 2022-01-08 DIAGNOSIS — D701 Agranulocytosis secondary to cancer chemotherapy: Secondary | ICD-10-CM | POA: Diagnosis not present

## 2022-01-08 DIAGNOSIS — Z95828 Presence of other vascular implants and grafts: Secondary | ICD-10-CM

## 2022-01-08 LAB — CMP (CANCER CENTER ONLY)
ALT: 44 U/L (ref 0–44)
AST: 35 U/L (ref 15–41)
Albumin: 4.2 g/dL (ref 3.5–5.0)
Alkaline Phosphatase: 48 U/L (ref 38–126)
Anion gap: 10 (ref 5–15)
BUN: 25 mg/dL — ABNORMAL HIGH (ref 8–23)
CO2: 25 mmol/L (ref 22–32)
Calcium: 9.1 mg/dL (ref 8.9–10.3)
Chloride: 102 mmol/L (ref 98–111)
Creatinine: 1.16 mg/dL (ref 0.61–1.24)
GFR, Estimated: >60 mL/min (ref >60)
Glucose, Bld: 124 mg/dL — ABNORMAL HIGH (ref 70–99)
Potassium: 3.9 mmol/L (ref 3.5–5.1)
Sodium: 137 mmol/L (ref 135–145)
Total Bilirubin: 0.4 mg/dL (ref 0.3–1.2)
Total Protein: 6.9 g/dL (ref 6.5–8.1)

## 2022-01-08 LAB — CBC WITH DIFFERENTIAL (CANCER CENTER ONLY)
Abs Immature Granulocytes: 0.01 10*3/uL (ref 0.00–0.07)
Basophils Absolute: 0 10*3/uL (ref 0.0–0.1)
Basophils Relative: 1 %
Eosinophils Absolute: 0 10*3/uL (ref 0.0–0.5)
Eosinophils Relative: 0 %
HCT: 24.8 % — ABNORMAL LOW (ref 39.0–52.0)
Hemoglobin: 8.9 g/dL — ABNORMAL LOW (ref 13.0–17.0)
Immature Granulocytes: 0 %
Lymphocytes Relative: 41 %
Lymphs Abs: 1.1 10*3/uL (ref 0.7–4.0)
MCH: 34 pg (ref 26.0–34.0)
MCHC: 35.9 g/dL (ref 30.0–36.0)
MCV: 94.7 fL (ref 80.0–100.0)
Monocytes Absolute: 0.1 10*3/uL (ref 0.1–1.0)
Monocytes Relative: 4 %
Neutro Abs: 1.5 10*3/uL — ABNORMAL LOW (ref 1.7–7.7)
Neutrophils Relative %: 54 %
Platelet Count: 126 10*3/uL — ABNORMAL LOW (ref 150–400)
RBC: 2.62 MIL/uL — ABNORMAL LOW (ref 4.22–5.81)
RDW: 17.1 % — ABNORMAL HIGH (ref 11.5–15.5)
WBC Count: 2.7 10*3/uL — ABNORMAL LOW (ref 4.0–10.5)
nRBC: 0 % (ref 0.0–0.2)

## 2022-01-08 MED ORDER — SODIUM CHLORIDE 0.9% FLUSH
10.0000 mL | INTRAVENOUS | Status: DC | PRN
  Administered 2022-01-08: 10 mL

## 2022-01-08 MED ORDER — SODIUM CHLORIDE 0.9 % IV SOLN
1000.0000 mg/m2 | Freq: Once | INTRAVENOUS | Status: AC
  Administered 2022-01-08: 1824 mg via INTRAVENOUS
  Filled 2022-01-08: qty 47.97

## 2022-01-08 MED ORDER — HEPARIN SOD (PORK) LOCK FLUSH 100 UNIT/ML IV SOLN
500.0000 [IU] | Freq: Once | INTRAVENOUS | Status: AC | PRN
  Administered 2022-01-08: 500 [IU]

## 2022-01-08 MED ORDER — SODIUM CHLORIDE 0.9 % IV SOLN: Freq: Once | INTRAVENOUS | Status: AC

## 2022-01-08 MED ORDER — PROCHLORPERAZINE MALEATE 10 MG PO TABS
10.0000 mg | ORAL_TABLET | Freq: Once | ORAL | Status: AC
  Administered 2022-01-08: 10 mg via ORAL
  Filled 2022-01-08: qty 1

## 2022-01-08 MED ORDER — SODIUM CHLORIDE 0.9% FLUSH
10.0000 mL | Freq: Once | INTRAVENOUS | Status: AC
  Administered 2022-01-08: 10 mL

## 2022-01-08 NOTE — Progress Notes (Signed)
Nutrition Follow-up:  Patient with bladder cancer. He is currently receiving cisplatin and gemcitabine.   Met with patient during infusion. He reports fatigue lasting a few days after treatment. Patient reports nausea that last ~6 hours day after getting up every morning. This has been persistent since first treatment. Patient reports having nausea medication. He has taken this once. Patient reports not eating breakfast due to nausea. He will drink an Ensure around lunch. He cuts this with milk to reduce thickness and cut sweetness. Patient reports nausea is usually better late day and he eats a good supper. He denies episodes of vomiting, constipation, diarrhea.   Medications: reviewed   Labs: glucose 124, BUN 25  Anthropometrics: 168 lb (per pt report of weight in infusion today, this has not been updated in system)  1/10 - 172 lb 1.6 oz 12/19 - 168 lb 1.6 oz   NUTRITION DIAGNOSIS: Inadequate oral intake related to cancer and associated treatment side effects as evidenced by reported decreased intake from baseline due to persistent nausea    INTERVENTION:  Encouraged small frequent meals and snacks with adequate calories and protein - handout with ideas provided Continue drinking Ensure Plus/equivalent, recommend twice/day - coupons provided Educated on alternate supplement ideas (CIB mixed with whole milk) Discussed tips for nausea, foods best tolerated, foods to avoid - handout provided Encouraged pt to take nausea medication as prescribed, suggested waiting ~30 minutes after taking before eating  Provided contact information   MONITORING, EVALUATION, GOAL: weight trends, intake   NEXT VISIT: To be scheduled with treatment plan

## 2022-01-11 DIAGNOSIS — I10 Essential (primary) hypertension: Secondary | ICD-10-CM | POA: Diagnosis not present

## 2022-01-11 DIAGNOSIS — E785 Hyperlipidemia, unspecified: Secondary | ICD-10-CM | POA: Diagnosis not present

## 2022-01-24 ENCOUNTER — Ambulatory Visit (HOSPITAL_COMMUNITY)
Admission: RE | Admit: 2022-01-24 | Discharge: 2022-01-24 | Disposition: A | Discharge status: Home / Self Care | Payer: BC Managed Care – PPO | Source: Ambulatory Visit | Attending: Oncology | Admitting: Oncology

## 2022-01-24 ENCOUNTER — Other Ambulatory Visit: Payer: Self-pay

## 2022-01-24 ENCOUNTER — Encounter (HOSPITAL_COMMUNITY): Payer: Self-pay

## 2022-01-24 ENCOUNTER — Inpatient Hospital Stay: Payer: BC Managed Care – PPO | Attending: Oncology

## 2022-01-24 DIAGNOSIS — C679 Malignant neoplasm of bladder, unspecified: Secondary | ICD-10-CM | POA: Diagnosis not present

## 2022-01-24 DIAGNOSIS — J432 Centrilobular emphysema: Secondary | ICD-10-CM | POA: Diagnosis not present

## 2022-01-24 DIAGNOSIS — D6481 Anemia due to antineoplastic chemotherapy: Secondary | ICD-10-CM | POA: Insufficient documentation

## 2022-01-24 DIAGNOSIS — N3289 Other specified disorders of bladder: Secondary | ICD-10-CM | POA: Diagnosis not present

## 2022-01-24 DIAGNOSIS — N261 Atrophy of kidney (terminal): Secondary | ICD-10-CM | POA: Diagnosis not present

## 2022-01-24 DIAGNOSIS — I700 Atherosclerosis of aorta: Secondary | ICD-10-CM | POA: Diagnosis not present

## 2022-01-24 DIAGNOSIS — Z79899 Other long term (current) drug therapy: Secondary | ICD-10-CM | POA: Insufficient documentation

## 2022-01-24 DIAGNOSIS — Z95828 Presence of other vascular implants and grafts: Secondary | ICD-10-CM

## 2022-01-24 LAB — CBC WITH DIFFERENTIAL (CANCER CENTER ONLY)
Abs Immature Granulocytes: 0.01 10*3/uL (ref 0.00–0.07)
Basophils Absolute: 0 10*3/uL (ref 0.0–0.1)
Basophils Relative: 0 %
Eosinophils Absolute: 0 10*3/uL (ref 0.0–0.5)
Eosinophils Relative: 1 %
HCT: 25.3 % — ABNORMAL LOW (ref 39.0–52.0)
Hemoglobin: 8.6 g/dL — ABNORMAL LOW (ref 13.0–17.0)
Immature Granulocytes: 0 %
Lymphocytes Relative: 50 %
Lymphs Abs: 1.6 10*3/uL (ref 0.7–4.0)
MCH: 34.1 pg — ABNORMAL HIGH (ref 26.0–34.0)
MCHC: 34 g/dL (ref 30.0–36.0)
MCV: 100.4 fL — ABNORMAL HIGH (ref 80.0–100.0)
Monocytes Absolute: 0.8 10*3/uL (ref 0.1–1.0)
Monocytes Relative: 25 %
Neutro Abs: 0.8 10*3/uL — ABNORMAL LOW (ref 1.7–7.7)
Neutrophils Relative %: 24 %
Platelet Count: 197 10*3/uL (ref 150–400)
RBC: 2.52 MIL/uL — ABNORMAL LOW (ref 4.22–5.81)
RDW: 18.8 % — ABNORMAL HIGH (ref 11.5–15.5)
WBC Count: 3.2 10*3/uL — ABNORMAL LOW (ref 4.0–10.5)
nRBC: 0 % (ref 0.0–0.2)

## 2022-01-24 LAB — CMP (CANCER CENTER ONLY)
ALT: 13 U/L (ref 0–44)
AST: 17 U/L (ref 15–41)
Albumin: 4.2 g/dL (ref 3.5–5.0)
Alkaline Phosphatase: 52 U/L (ref 38–126)
Anion gap: 6 (ref 5–15)
BUN: 14 mg/dL (ref 8–23)
CO2: 29 mmol/L (ref 22–32)
Calcium: 9.3 mg/dL (ref 8.9–10.3)
Chloride: 105 mmol/L (ref 98–111)
Creatinine: 1.04 mg/dL (ref 0.61–1.24)
GFR, Estimated: >60 mL/min (ref >60)
Glucose, Bld: 92 mg/dL (ref 70–99)
Potassium: 3.7 mmol/L (ref 3.5–5.1)
Sodium: 140 mmol/L (ref 135–145)
Total Bilirubin: 0.4 mg/dL (ref 0.3–1.2)
Total Protein: 6.8 g/dL (ref 6.5–8.1)

## 2022-01-24 MED ORDER — HEPARIN SOD (PORK) LOCK FLUSH 100 UNIT/ML IV SOLN
INTRAVENOUS | Status: AC
  Administered 2022-01-24: 500 [IU] via INTRAVENOUS
  Filled 2022-01-24: qty 5

## 2022-01-24 MED ORDER — HEPARIN SOD (PORK) LOCK FLUSH 100 UNIT/ML IV SOLN: 500.0000 [IU] | Freq: Once | INTRAVENOUS | Status: AC

## 2022-01-24 MED ORDER — IOHEXOL 300 MG/ML  SOLN
100.0000 mL | Freq: Once | INTRAMUSCULAR | Status: AC | PRN
  Administered 2022-01-24: 100 mL via INTRAVENOUS

## 2022-01-24 MED ORDER — SODIUM CHLORIDE (PF) 0.9 % IJ SOLN
INTRAMUSCULAR | Status: AC
  Filled 2022-01-24: qty 50

## 2022-01-24 MED ORDER — SODIUM CHLORIDE 0.9 % IV SOLN
INTRAVENOUS | Status: AC
  Filled 2022-01-24: qty 250

## 2022-01-24 MED ORDER — SODIUM CHLORIDE 0.9% FLUSH
10.0000 mL | Freq: Once | INTRAVENOUS | Status: AC
  Administered 2022-01-24: 10 mL

## 2022-01-25 DIAGNOSIS — I10 Essential (primary) hypertension: Secondary | ICD-10-CM | POA: Diagnosis not present

## 2022-01-28 DIAGNOSIS — N35011 Post-traumatic bulbous urethral stricture: Secondary | ICD-10-CM | POA: Diagnosis not present

## 2022-01-28 DIAGNOSIS — C678 Malignant neoplasm of overlapping sites of bladder: Secondary | ICD-10-CM | POA: Diagnosis not present

## 2022-01-31 ENCOUNTER — Inpatient Hospital Stay: Payer: BC Managed Care – PPO | Admitting: Oncology

## 2022-01-31 ENCOUNTER — Other Ambulatory Visit: Payer: Self-pay

## 2022-01-31 VITALS — BP 144/89 | HR 89 | Temp 97.9°F | Resp 19 | Ht 67.0 in | Wt 164.6 lb

## 2022-01-31 DIAGNOSIS — C679 Malignant neoplasm of bladder, unspecified: Secondary | ICD-10-CM | POA: Diagnosis not present

## 2022-01-31 DIAGNOSIS — D6481 Anemia due to antineoplastic chemotherapy: Secondary | ICD-10-CM | POA: Insufficient documentation

## 2022-01-31 DIAGNOSIS — Z79899 Other long term (current) drug therapy: Secondary | ICD-10-CM | POA: Insufficient documentation

## 2022-01-31 NOTE — Progress Notes (Signed)
Hematology and Oncology Follow Up Visit  Jerome Cruz [id number] [birth year] 66 y.o. 01/31/2022 2:56 PM Jerome Cruz, [name], [name], MD   Principle Diagnosis: 66 year old man with T2N0 high-grade urothelial carcinoma of the bladder diagnosed in September 2022.   Prior Therapy:  He is status post TURBT in September 26, 2021.  Final pathology showed infiltrating high-grade papillary urothelial carcinoma that is poorly differentiated and 80% with a glandular component at 5%.   Neoadjuvant chemotherapy utilizing gemcitabine and cisplatin started on October 30, 2021.  He completed 4 cycles of therapy on January 08, 2022.   Current therapy: Under consideration for radical cystectomy.  Interim History: Jerome Cruz returns today for a follow-up visit.  Since the last visit, he reports no major changes in his health.  He has completed the chemotherapy without any major complications.  He does report some residual fatigue but no nausea or vomiting.  His performance status quality of life remained excellent.  He denied any hematuria or dysuria.    Medications: Updated on review. Current Outpatient Medications  Medication Sig Dispense Refill   esomeprazole (NEXIUM) 20 MG capsule Take 20 mg by mouth daily at 12 noon.     lidocaine-prilocaine (EMLA) cream Apply 1 application topically as needed. 30 g 0   oxybutynin (DITROPAN) 5 MG tablet Take 1 tablet (5 mg total) by mouth every 8 (eight) hours as needed for bladder spasms. 30 tablet 3   prochlorperazine (COMPAZINE) 10 MG tablet Take 1 tablet (10 mg total) by mouth every 6 (six) hours as needed for nausea or vomiting. 30 tablet 0   simvastatin (ZOCOR) 20 MG tablet Take 20 mg by mouth at bedtime.     traMADol (ULTRAM) 50 MG tablet Take 1 tablet (50 mg total) by mouth every 6 (six) hours as needed. 20 tablet 0   valsartan (DIOVAN) 80 MG tablet Take 80 mg by mouth every evening.     Zoster Vaccine Adjuvanted [facility]) injection Shingrix (PF) 50 mcg/0.5  mL intramuscular suspension, kit  ADM 0.5ML IM UTD     No current facility-administered medications for this visit.   Facility-Administered Medications Ordered in Other Visits  Medication Dose Route Frequency Provider Last Rate Last Admin   gemcitabine (GEMZAR) chemo syringe for bladder instillation 2,000 mg  2,000 mg Bladder Instillation Once Jerome Fries, MD         Allergies:  Allergies  Allergen Reactions   Atorvastatin Other (See Comments)   Irbesartan Other (See Comments)      Physical Exam:  Blood pressure (!) 144/89, pulse 89, temperature 97.9 F (36.6 C), temperature source Axillary, resp. rate 19, height 5' 7"  (1.702 m), weight 164 lb 9.6 oz (74.7 kg), SpO2 97 %.  ECOG: 0    General appearance: Comfortable appearing without any discomfort Head: Normocephalic without any trauma Oropharynx: Mucous membranes are moist and pink without any thrush or ulcers. Eyes: Pupils are equal and round reactive to light. Lymph nodes: No cervical, supraclavicular, inguinal or axillary lymphadenopathy.   Heart:regular rate and rhythm.  S1 and S2 without leg edema. Lung: Clear without any rhonchi or wheezes.  No dullness to percussion. Abdomin: Soft, nontender, nondistended with good bowel sounds.  No hepatosplenomegaly. Musculoskeletal: No joint deformity or effusion.  Full range of motion noted. Neurological: No deficits noted on motor, sensory and deep tendon reflex exam. Skin: No petechial rash or dryness.  Appeared moist.          Lab Results: Lab Results  Component Value Date  WBC 3.2 (L) 01/24/2022   HGB 8.6 (L) 01/24/2022   HCT 25.3 (L) 01/24/2022   MCV 100.4 (H) 01/24/2022   PLT 197 01/24/2022     Chemistry      Component Value Date/Time   NA 140 01/24/2022 1256   K 3.7 01/24/2022 1256   CL 105 01/24/2022 1256   CO2 29 01/24/2022 1256   BUN 14 01/24/2022 1256   CREATININE 1.04 01/24/2022 1256      Component Value Date/Time   CALCIUM 9.3  01/24/2022 1256   ALKPHOS 52 01/24/2022 1256   AST 17 01/24/2022 1256   ALT 13 01/24/2022 1256   BILITOT 0.4 01/24/2022 1256          Impression and Plan:  66 year old with:  1. T2N0 high-grade papillary urothelial carcinoma of the bladder diagnosed in September 2022.    His disease status was updated at this time treatment choices were reviewed.  He has completed 4 cycles of neoadjuvant chemotherapy without any evidence of metastatic disease.  Treatment choices moving forward were reviewed and he was evaluated by Dr. Tresa Cruz regarding radical cystectomy option.  Alternative option would be definitive treatment with radiation concomitantly with chemotherapy.  After discussion today, he will proceed with a radical cystectomy in the care of Jerome Cruz in the near future.     2.  IV access: Port-A-Cath will continue to be flushed periodically.   3.  Antiemetics: None delayed nausea or vomiting reported at this time.   4.  Renal function surveillance: Kidney function remains normal after platinum based therapy.   5.  Neutropenia: Improved after the conclusion of chemotherapy.  6.  Anemia: Hemoglobin is stable and does not require transfusion.  This is related to chemotherapy.    7.  Follow-up: In 3 months for repeat follow-up.   30  minutes were spent on this visit.  The time was dedicated to reviewing laboratory data, disease status update outlining future plan of care reviewed.          Jerome Button, MD 2/9/20232:56 PM

## 2022-02-06 ENCOUNTER — Other Ambulatory Visit: Payer: Self-pay | Admitting: Urology

## 2022-03-15 DIAGNOSIS — H524 Presbyopia: Secondary | ICD-10-CM | POA: Diagnosis not present

## 2022-03-15 DIAGNOSIS — H2513 Age-related nuclear cataract, bilateral: Secondary | ICD-10-CM | POA: Diagnosis not present

## 2022-03-19 DIAGNOSIS — C678 Malignant neoplasm of overlapping sites of bladder: Secondary | ICD-10-CM | POA: Diagnosis not present

## 2022-03-26 ENCOUNTER — Other Ambulatory Visit: Payer: Self-pay

## 2022-03-26 ENCOUNTER — Encounter (HOSPITAL_COMMUNITY): Payer: Self-pay | Admitting: Urology

## 2022-03-26 ENCOUNTER — Inpatient Hospital Stay (HOSPITAL_COMMUNITY)
Admission: RE | Admit: 2022-03-26 | Discharge: 2022-04-02 | DRG: 654 | Disposition: A | Discharge status: Home Health | Payer: BC Managed Care – PPO | Source: Ambulatory Visit | Attending: Urology | Admitting: Urology

## 2022-03-26 DIAGNOSIS — Z888 Allergy status to other drugs, medicaments and biological substances status: Secondary | ICD-10-CM | POA: Diagnosis not present

## 2022-03-26 DIAGNOSIS — K567 Ileus, unspecified: Secondary | ICD-10-CM | POA: Diagnosis not present

## 2022-03-26 DIAGNOSIS — Z87891 Personal history of nicotine dependence: Secondary | ICD-10-CM

## 2022-03-26 DIAGNOSIS — Z809 Family history of malignant neoplasm, unspecified: Secondary | ICD-10-CM | POA: Diagnosis not present

## 2022-03-26 DIAGNOSIS — C679 Malignant neoplasm of bladder, unspecified: Secondary | ICD-10-CM | POA: Diagnosis not present

## 2022-03-26 DIAGNOSIS — I10 Essential (primary) hypertension: Secondary | ICD-10-CM | POA: Diagnosis present

## 2022-03-26 DIAGNOSIS — E785 Hyperlipidemia, unspecified: Secondary | ICD-10-CM | POA: Diagnosis present

## 2022-03-26 DIAGNOSIS — Z8249 Family history of ischemic heart disease and other diseases of the circulatory system: Secondary | ICD-10-CM

## 2022-03-26 DIAGNOSIS — N35912 Unspecified bulbous urethral stricture, male: Secondary | ICD-10-CM | POA: Diagnosis present

## 2022-03-26 DIAGNOSIS — Z79899 Other long term (current) drug therapy: Secondary | ICD-10-CM

## 2022-03-26 DIAGNOSIS — C678 Malignant neoplasm of overlapping sites of bladder: Secondary | ICD-10-CM | POA: Diagnosis not present

## 2022-03-26 LAB — CBC
HCT: 37.1 % — ABNORMAL LOW (ref 39.0–52.0)
Hemoglobin: 12.7 g/dL — ABNORMAL LOW (ref 13.0–17.0)
MCH: 33.6 pg (ref 26.0–34.0)
MCHC: 34.2 g/dL (ref 30.0–36.0)
MCV: 98.1 fL (ref 80.0–100.0)
Platelets: 143 10*3/uL — ABNORMAL LOW (ref 150–400)
RBC: 3.78 MIL/uL — ABNORMAL LOW (ref 4.22–5.81)
RDW: 12.1 % (ref 11.5–15.5)
WBC: 5.7 10*3/uL (ref 4.0–10.5)
nRBC: 0 % (ref 0.0–0.2)

## 2022-03-26 LAB — COMPREHENSIVE METABOLIC PANEL
ALT: 18 U/L (ref 0–44)
AST: 18 U/L (ref 15–41)
Albumin: 4.1 g/dL (ref 3.5–5.0)
Alkaline Phosphatase: 41 U/L (ref 38–126)
Anion gap: 8 (ref 5–15)
BUN: 20 mg/dL (ref 8–23)
CO2: 23 mmol/L (ref 22–32)
Calcium: 9.2 mg/dL (ref 8.9–10.3)
Chloride: 108 mmol/L (ref 98–111)
Creatinine, Ser: 1.15 mg/dL (ref 0.61–1.24)
GFR, Estimated: >60 mL/min (ref >60)
Glucose, Bld: 95 mg/dL (ref 70–99)
Potassium: 3.7 mmol/L (ref 3.5–5.1)
Sodium: 139 mmol/L (ref 135–145)
Total Bilirubin: 0.5 mg/dL (ref 0.3–1.2)
Total Protein: 7.1 g/dL (ref 6.5–8.1)

## 2022-03-26 LAB — PREPARE RBC (CROSSMATCH)

## 2022-03-26 MED ORDER — METRONIDAZOLE 500 MG PO TABS
500.0000 mg | ORAL_TABLET | ORAL | Status: AC
  Administered 2022-03-26 (×3): 500 mg via ORAL
  Filled 2022-03-26 (×3): qty 1

## 2022-03-26 MED ORDER — SODIUM CHLORIDE 0.9% FLUSH
10.0000 mL | Freq: Two times a day (BID) | INTRAVENOUS | Status: DC
  Administered 2022-03-27: 10 mL

## 2022-03-26 MED ORDER — ACETAMINOPHEN 500 MG PO TABS
1000.0000 mg | ORAL_TABLET | Freq: Once | ORAL | Status: AC
  Administered 2022-03-26: 1000 mg via ORAL
  Filled 2022-03-26: qty 2

## 2022-03-26 MED ORDER — PANTOPRAZOLE SODIUM 40 MG PO TBEC
40.0000 mg | DELAYED_RELEASE_TABLET | Freq: Every day | ORAL | Status: DC
  Administered 2022-03-26 – 2022-04-02 (×7): 40 mg via ORAL
  Filled 2022-03-26 (×7): qty 1

## 2022-03-26 MED ORDER — SODIUM CHLORIDE 0.9% FLUSH
10.0000 mL | INTRAVENOUS | Status: DC | PRN
  Administered 2022-04-01: 10 mL

## 2022-03-26 MED ORDER — CHLORHEXIDINE GLUCONATE CLOTH 2 % EX PADS
6.0000 | MEDICATED_PAD | Freq: Every day | CUTANEOUS | Status: DC
  Administered 2022-03-26: 6 via TOPICAL

## 2022-03-26 MED ORDER — NEOMYCIN SULFATE 500 MG PO TABS: 500.0000 mg | ORAL_TABLET | ORAL | Status: DC

## 2022-03-26 MED ORDER — CELECOXIB 200 MG PO CAPS
200.0000 mg | ORAL_CAPSULE | Freq: Once | ORAL | Status: AC
  Administered 2022-03-26: 200 mg via ORAL
  Filled 2022-03-26: qty 1

## 2022-03-26 MED ORDER — SIMVASTATIN 20 MG PO TABS
20.0000 mg | ORAL_TABLET | Freq: Every day | ORAL | Status: DC
  Administered 2022-03-26 – 2022-04-01 (×7): 20 mg via ORAL
  Filled 2022-03-26 (×7): qty 1

## 2022-03-26 MED ORDER — PIPERACILLIN-TAZOBACTAM 3.375 G IVPB 30 MIN
3.3750 g | INTRAVENOUS | Status: AC
  Administered 2022-03-27: 3.375 g via INTRAVENOUS
  Filled 2022-03-26: qty 50

## 2022-03-26 MED ORDER — ALVIMOPAN 12 MG PO CAPS
12.0000 mg | ORAL_CAPSULE | ORAL | Status: AC
  Administered 2022-03-27: 12 mg via ORAL
  Filled 2022-03-26: qty 1

## 2022-03-26 MED ORDER — PEG 3350-KCL-NA BICARB-NACL 420 G PO SOLR
4000.0000 mL | Freq: Once | ORAL | Status: AC
  Administered 2022-03-26: 4000 mL via ORAL

## 2022-03-26 MED ORDER — SODIUM CHLORIDE 0.9 % IV SOLN: INTRAVENOUS | Status: DC

## 2022-03-26 NOTE — Anesthesia Preprocedure Evaluation (Addendum)
Anesthesia Evaluation  ?Patient identified by MRN, date of birth, ID band ?Patient awake ? ? ? ?Reviewed: ?Allergy & Precautions, NPO status , Patient's Chart, lab work & pertinent test results ? ?Airway ?Mallampati: I ? ?TM Distance: >3 FB ?Neck ROM: Full ? ?Mouth opening: Limited Mouth Opening ? Dental ? ?(+) Dental Advisory Given, Teeth Intact, Caps ?  ?Pulmonary ?neg pulmonary ROS, former smoker,  ?  ?breath sounds clear to auscultation ? ? ? ? ? ? Cardiovascular ?hypertension, Pt. on medications ?negative cardio ROS ? ? ?Rhythm:Regular Rate:Normal ? ? ?  ?Neuro/Psych ?negative neurological ROS ? negative psych ROS  ? GI/Hepatic ?negative GI ROS, Neg liver ROS, GERD  Medicated,  ?Endo/Other  ?negative endocrine ROS ? Renal/GU ?negative Renal ROS  ?negative genitourinary ?  ?Musculoskeletal ?negative musculoskeletal ROS ?(+)  ? Abdominal ?Normal abdominal exam  (+)   ?Peds ?negative pediatric ROS ?(+)  Hematology ?negative hematology ROS ?(+)   ?Anesthesia Other Findings ? ? Reproductive/Obstetrics ?negative OB ROS ? ?  ? ? ? ? ? ? ? ? ? ? ? ? ? ?  ?  ? ? ? ? ? ? ? ?Anesthesia Physical ? ?Anesthesia Plan ? ?ASA: 3 ? ?Anesthesia Plan: General  ? ?Post-op Pain Management: Tylenol PO (pre-op)* and Celebrex PO (pre-op)*  ? ?Induction: Intravenous ? ?PONV Risk Score and Plan: 3 and Ondansetron, Midazolam and Dexamethasone ? ?Airway Management Planned: Oral ETT ? ?Additional Equipment:  ? ?Intra-op Plan:  ? ?Post-operative Plan: Extubation in OR ? ?Informed Consent: I have reviewed the patients History and Physical, chart, labs and discussed the procedure including the risks, benefits and alternatives for the proposed anesthesia with the patient or authorized representative who has indicated his/her understanding and acceptance.  ? ? ? ?Dental advisory given ? ?Plan Discussed with: CRNA ? ?Anesthesia Plan Comments: (2 LB IV)  ? ? ? ? ? ?Anesthesia Quick Evaluation ? ?

## 2022-03-26 NOTE — Consult Note (Addendum)
[facility] Nurse requested for preoperative stoma site marking ? ?Discussed surgical procedure and stoma creation with patient. Explained role of the [facility] nurse team.  Provided the patient with educational booklet and provided samples of pouching options.  Answered patient's questions.  ? ?Examined patient lying, sitting, and standing in order to place the marking in the patient's visual field, away from any creases or abdominal contour issues and within the rectus muscle.  Attempted to mark below the patient's belt line, but this was not possible since a significant crease occurs lower on the abdomen which should be avoided if possible.  ? ?Marked for ileal conduit in the RLQ __5__  cm to the right of the umbilicus and  __4__ cm above the umbilicus. ? ?Patient's abdomen cleansed with CHG wipes at site markings, allowed to air dry prior to marking.Covered mark with thin film transparent dressing to preserve mark until date of surgery.  ? ?[facility] Nurse team will follow up with patient after surgery for continued ostomy care and teaching.  ?Julien Girt MSN, RN, [facility], [place], CNS ?[phone]  ?

## 2022-03-27 ENCOUNTER — Inpatient Hospital Stay (HOSPITAL_COMMUNITY): Payer: BC Managed Care – PPO | Admitting: Certified Registered Nurse Anesthetist

## 2022-03-27 ENCOUNTER — Other Ambulatory Visit: Payer: Self-pay

## 2022-03-27 ENCOUNTER — Encounter (HOSPITAL_COMMUNITY): Payer: Self-pay | Admitting: Urology

## 2022-03-27 ENCOUNTER — Encounter (HOSPITAL_COMMUNITY)
Admission: RE | Disposition: A | Discharge status: Home Health | Payer: Self-pay | Source: Ambulatory Visit | Attending: Urology

## 2022-03-27 HISTORY — PX: ROBOT ASSISTED LAPAROSCOPIC COMPLETE CYSTECT ILEAL CONDUIT: SHX5139

## 2022-03-27 HISTORY — PX: ROBOT ASSISTED LAPAROSCOPIC RADICAL PROSTATECTOMY: SHX5141

## 2022-03-27 HISTORY — PX: LYMPH NODE DISSECTION: SHX5087

## 2022-03-27 HISTORY — PX: CYSTOSCOPY WITH INJECTION: SHX1424

## 2022-03-27 LAB — SURGICAL PCR SCREEN
MRSA, PCR: NEGATIVE
Staphylococcus aureus: NEGATIVE

## 2022-03-27 LAB — HEMOGLOBIN AND HEMATOCRIT, BLOOD
HCT: 34.8 % — ABNORMAL LOW (ref 39.0–52.0)
Hemoglobin: 11.9 g/dL — ABNORMAL LOW (ref 13.0–17.0)

## 2022-03-27 SURGERY — CYSTECTOMY, ROBOT-ASSISTED, WITH ILEAL CONDUIT CREATION
Anesthesia: General | Site: Abdomen

## 2022-03-27 MED ORDER — PROPOFOL 10 MG/ML IV BOLUS
INTRAVENOUS | Status: AC
Start: 2022-03-27 — End: ?
  Filled 2022-03-27: qty 20

## 2022-03-27 MED ORDER — OXYCODONE HCL 5 MG PO TABS
5.0000 mg | ORAL_TABLET | ORAL | Status: DC | PRN
  Administered 2022-03-28 – 2022-04-02 (×14): 5 mg via ORAL
  Filled 2022-03-27 (×16): qty 1

## 2022-03-27 MED ORDER — DIPHENHYDRAMINE HCL 12.5 MG/5ML PO ELIX: 12.5000 mg | ORAL_SOLUTION | Freq: Four times a day (QID) | ORAL | Status: DC | PRN

## 2022-03-27 MED ORDER — MIDAZOLAM HCL 2 MG/2ML IJ SOLN
INTRAMUSCULAR | Status: AC
  Filled 2022-03-27: qty 2

## 2022-03-27 MED ORDER — WATER FOR IRRIGATION, STERILE IR SOLN
Status: DC | PRN
Start: 2022-03-27 — End: 2022-03-27
  Administered 2022-03-27: 1000 mL via SURGICAL_CAVITY

## 2022-03-27 MED ORDER — DIPHENHYDRAMINE HCL 50 MG/ML IJ SOLN: 12.5000 mg | Freq: Four times a day (QID) | INTRAMUSCULAR | Status: DC | PRN

## 2022-03-27 MED ORDER — DEXAMETHASONE SODIUM PHOSPHATE 10 MG/ML IJ SOLN
INTRAMUSCULAR | Status: DC | PRN
  Administered 2022-03-27: 10 mg via INTRAVENOUS

## 2022-03-27 MED ORDER — SODIUM CHLORIDE (PF) 0.9 % IJ SOLN
INTRAMUSCULAR | Status: AC
  Filled 2022-03-27: qty 30

## 2022-03-27 MED ORDER — OXYCODONE HCL 5 MG/5ML PO SOLN: 5.0000 mg | Freq: Once | ORAL | Status: DC | PRN

## 2022-03-27 MED ORDER — ONDANSETRON HCL 4 MG/2ML IJ SOLN
INTRAMUSCULAR | Status: AC
  Filled 2022-03-27: qty 2

## 2022-03-27 MED ORDER — LACTATED RINGERS IR SOLN
Status: DC | PRN
  Administered 2022-03-27: 1000 mL

## 2022-03-27 MED ORDER — PHENYLEPHRINE 40 MCG/ML (10ML) SYRINGE FOR IV PUSH (FOR BLOOD PRESSURE SUPPORT)
PREFILLED_SYRINGE | INTRAVENOUS | Status: DC | PRN
  Administered 2022-03-27: 40 ug via INTRAVENOUS
  Administered 2022-03-27: 80 ug via INTRAVENOUS
  Administered 2022-03-27: 120 ug via INTRAVENOUS
  Administered 2022-03-27 (×3): 80 ug via INTRAVENOUS

## 2022-03-27 MED ORDER — FENTANYL CITRATE PF 50 MCG/ML IJ SOSY
PREFILLED_SYRINGE | INTRAMUSCULAR | Status: AC
  Filled 2022-03-27: qty 3

## 2022-03-27 MED ORDER — ROCURONIUM BROMIDE 10 MG/ML (PF) SYRINGE
PREFILLED_SYRINGE | INTRAVENOUS | Status: AC
  Filled 2022-03-27: qty 10

## 2022-03-27 MED ORDER — NAPHAZOLINE-GLYCERIN 0.012-0.25 % OP SOLN
1.0000 [drp] | Freq: Four times a day (QID) | OPHTHALMIC | Status: DC | PRN
  Administered 2022-03-27: 1 [drp] via OPHTHALMIC
  Filled 2022-03-27: qty 15

## 2022-03-27 MED ORDER — BUPIVACAINE LIPOSOME 1.3 % IJ SUSP
INTRAMUSCULAR | Status: DC | PRN
  Administered 2022-03-27: 20 mL

## 2022-03-27 MED ORDER — SODIUM CHLORIDE (PF) 0.9 % IJ SOLN
INTRAMUSCULAR | Status: DC | PRN
  Administered 2022-03-27: 20 mL

## 2022-03-27 MED ORDER — STERILE WATER FOR IRRIGATION IR SOLN
Status: DC | PRN
  Administered 2022-03-27: 3000 mL

## 2022-03-27 MED ORDER — ACETAMINOPHEN 500 MG PO TABS
1000.0000 mg | ORAL_TABLET | Freq: Once | ORAL | Status: AC
  Administered 2022-03-27: 1000 mg via ORAL

## 2022-03-27 MED ORDER — ACETAMINOPHEN 325 MG PO TABS: 325.0000 mg | ORAL_TABLET | ORAL | Status: DC | PRN

## 2022-03-27 MED ORDER — EPHEDRINE SULFATE-NACL 50-0.9 MG/10ML-% IV SOSY
PREFILLED_SYRINGE | INTRAVENOUS | Status: DC | PRN
  Administered 2022-03-27 (×2): 10 mg via INTRAVENOUS

## 2022-03-27 MED ORDER — BUPIVACAINE LIPOSOME 1.3 % IJ SUSP
INTRAMUSCULAR | Status: AC
  Filled 2022-03-27: qty 20

## 2022-03-27 MED ORDER — CHLORHEXIDINE GLUCONATE CLOTH 2 % EX PADS
6.0000 | MEDICATED_PAD | Freq: Every day | CUTANEOUS | Status: DC
  Administered 2022-03-28 – 2022-04-01 (×5): 6 via TOPICAL

## 2022-03-27 MED ORDER — PROPOFOL 10 MG/ML IV BOLUS
INTRAVENOUS | Status: DC | PRN
  Administered 2022-03-27: 150 mg via INTRAVENOUS

## 2022-03-27 MED ORDER — MIDAZOLAM HCL 5 MG/5ML IJ SOLN
INTRAMUSCULAR | Status: DC | PRN
Start: 2022-03-27 — End: 2022-03-27
  Administered 2022-03-27: 2 mg via INTRAVENOUS

## 2022-03-27 MED ORDER — FENTANYL CITRATE (PF) 100 MCG/2ML IJ SOLN
INTRAMUSCULAR | Status: DC | PRN
  Administered 2022-03-27: 50 ug via INTRAVENOUS
  Administered 2022-03-27: 100 ug via INTRAVENOUS

## 2022-03-27 MED ORDER — SENNOSIDES-DOCUSATE SODIUM 8.6-50 MG PO TABS
2.0000 | ORAL_TABLET | Freq: Every day | ORAL | Status: DC
  Administered 2022-03-27 – 2022-04-01 (×5): 2 via ORAL
  Filled 2022-03-27 (×6): qty 2

## 2022-03-27 MED ORDER — ALVIMOPAN 12 MG PO CAPS
12.0000 mg | ORAL_CAPSULE | Freq: Two times a day (BID) | ORAL | Status: DC
  Administered 2022-03-28 – 2022-04-01 (×8): 12 mg via ORAL
  Filled 2022-03-27 (×10): qty 1

## 2022-03-27 MED ORDER — SODIUM CHLORIDE (PF) 0.9 % IJ SOLN
INTRAMUSCULAR | Status: AC
  Filled 2022-03-27: qty 20

## 2022-03-27 MED ORDER — ROCURONIUM BROMIDE 10 MG/ML (PF) SYRINGE
PREFILLED_SYRINGE | INTRAVENOUS | Status: DC | PRN
  Administered 2022-03-27: 70 mg via INTRAVENOUS
  Administered 2022-03-27: 30 mg via INTRAVENOUS
  Administered 2022-03-27: 20 mg via INTRAVENOUS
  Administered 2022-03-27: 10 mg via INTRAVENOUS

## 2022-03-27 MED ORDER — LIDOCAINE HCL 2 % IJ SOLN
INTRAMUSCULAR | Status: AC
Start: 2022-03-27 — End: ?
  Filled 2022-03-27: qty 20

## 2022-03-27 MED ORDER — LIDOCAINE HCL (PF) 2 % IJ SOLN
INTRAMUSCULAR | Status: DC | PRN
  Administered 2022-03-27: 1 mg/kg/h via INTRADERMAL

## 2022-03-27 MED ORDER — KETOROLAC TROMETHAMINE 30 MG/ML IJ SOLN
INTRAMUSCULAR | Status: AC
  Filled 2022-03-27: qty 1

## 2022-03-27 MED ORDER — FENTANYL CITRATE (PF) 100 MCG/2ML IJ SOLN
INTRAMUSCULAR | Status: AC
Start: 2022-03-27 — End: ?
  Filled 2022-03-27: qty 2

## 2022-03-27 MED ORDER — PHENYLEPHRINE 40 MCG/ML (10ML) SYRINGE FOR IV PUSH (FOR BLOOD PRESSURE SUPPORT)
PREFILLED_SYRINGE | INTRAVENOUS | Status: AC
  Filled 2022-03-27: qty 10

## 2022-03-27 MED ORDER — SUGAMMADEX SODIUM 200 MG/2ML IV SOLN
INTRAVENOUS | Status: DC | PRN
  Administered 2022-03-27: 200 mg via INTRAVENOUS

## 2022-03-27 MED ORDER — ONDANSETRON HCL 4 MG/2ML IJ SOLN: 4.0000 mg | Freq: Once | INTRAMUSCULAR | Status: DC | PRN

## 2022-03-27 MED ORDER — LIDOCAINE 2% (20 MG/ML) 5 ML SYRINGE
INTRAMUSCULAR | Status: DC | PRN
  Administered 2022-03-27: 50 mg via INTRAVENOUS

## 2022-03-27 MED ORDER — KETAMINE HCL 50 MG/5ML IJ SOSY
PREFILLED_SYRINGE | INTRAMUSCULAR | Status: AC
  Filled 2022-03-27: qty 5

## 2022-03-27 MED ORDER — ACETAMINOPHEN 160 MG/5ML PO SOLN: 325.0000 mg | ORAL | Status: DC | PRN

## 2022-03-27 MED ORDER — FENTANYL CITRATE PF 50 MCG/ML IJ SOSY
25.0000 ug | PREFILLED_SYRINGE | INTRAMUSCULAR | Status: DC | PRN
  Administered 2022-03-27 (×3): 50 ug via INTRAVENOUS

## 2022-03-27 MED ORDER — DEXAMETHASONE SODIUM PHOSPHATE 10 MG/ML IJ SOLN
INTRAMUSCULAR | Status: AC
  Filled 2022-03-27: qty 1

## 2022-03-27 MED ORDER — FENTANYL CITRATE (PF) 100 MCG/2ML IJ SOLN
INTRAMUSCULAR | Status: AC
  Filled 2022-03-27: qty 2

## 2022-03-27 MED ORDER — KETAMINE HCL 10 MG/ML IJ SOLN
INTRAMUSCULAR | Status: DC | PRN
  Administered 2022-03-27: 20 mg via INTRAVENOUS
  Administered 2022-03-27: 30 mg via INTRAVENOUS

## 2022-03-27 MED ORDER — CELECOXIB 200 MG PO CAPS
ORAL_CAPSULE | ORAL | Status: AC
  Filled 2022-03-27: qty 1

## 2022-03-27 MED ORDER — PHENYLEPHRINE 40 MCG/ML (10ML) SYRINGE FOR IV PUSH (FOR BLOOD PRESSURE SUPPORT)
PREFILLED_SYRINGE | INTRAVENOUS | Status: AC
  Filled 2022-03-27: qty 20

## 2022-03-27 MED ORDER — HYDROMORPHONE HCL 1 MG/ML IJ SOLN
0.5000 mg | INTRAMUSCULAR | Status: DC | PRN
  Administered 2022-03-27 – 2022-03-29 (×6): 1 mg via INTRAVENOUS
  Filled 2022-03-27 (×6): qty 1

## 2022-03-27 MED ORDER — ONDANSETRON HCL 4 MG/2ML IJ SOLN
INTRAMUSCULAR | Status: DC | PRN
  Administered 2022-03-27: 4 mg via INTRAVENOUS

## 2022-03-27 MED ORDER — MEPERIDINE HCL 50 MG/ML IJ SOLN: 6.2500 mg | INTRAMUSCULAR | Status: DC | PRN

## 2022-03-27 MED ORDER — DEXTROSE-NACL 5-0.45 % IV SOLN: INTRAVENOUS | Status: DC

## 2022-03-27 MED ORDER — ONDANSETRON HCL 4 MG/2ML IJ SOLN: 4.0000 mg | INTRAMUSCULAR | Status: DC | PRN

## 2022-03-27 MED ORDER — LIDOCAINE HCL 2 % IJ SOLN
INTRAMUSCULAR | Status: AC
  Filled 2022-03-27: qty 20

## 2022-03-27 MED ORDER — LACTATED RINGERS IV SOLN: INTRAVENOUS | Status: DC

## 2022-03-27 MED ORDER — OXYCODONE HCL 5 MG PO TABS: 5.0000 mg | ORAL_TABLET | Freq: Once | ORAL | Status: DC | PRN

## 2022-03-27 MED ORDER — HYDROMORPHONE HCL 1 MG/ML IJ SOLN: 0.2500 mg | INTRAMUSCULAR | Status: DC | PRN

## 2022-03-27 MED ORDER — ACETAMINOPHEN 10 MG/ML IV SOLN
1000.0000 mg | Freq: Four times a day (QID) | INTRAVENOUS | Status: AC
  Administered 2022-03-27 – 2022-03-28 (×4): 1000 mg via INTRAVENOUS
  Filled 2022-03-27 (×4): qty 100

## 2022-03-27 MED ORDER — ACETAMINOPHEN 500 MG PO TABS
ORAL_TABLET | ORAL | Status: AC
  Filled 2022-03-27: qty 2

## 2022-03-27 MED ORDER — PIPERACILLIN-TAZOBACTAM 3.375 G IVPB
3.3750 g | Freq: Three times a day (TID) | INTRAVENOUS | Status: AC
  Administered 2022-03-27 – 2022-03-28 (×3): 3.375 g via INTRAVENOUS
  Filled 2022-03-27 (×3): qty 50

## 2022-03-27 MED ORDER — CELECOXIB 200 MG PO CAPS
200.0000 mg | ORAL_CAPSULE | Freq: Once | ORAL | Status: AC
  Administered 2022-03-27: 200 mg via ORAL

## 2022-03-27 SURGICAL SUPPLY — 129 items
ADH SKN CLS APL DERMABOND .7 (GAUZE/BANDAGES/DRESSINGS) ×3
AGENT HMST KT MTR STRL THRMB (HEMOSTASIS)
APL ESCP 34 STRL LF DISP (HEMOSTASIS)
APL PRP STRL LF DISP 70% ISPRP (MISCELLANEOUS) ×3
APL SKNCLS STERI-STRIP NONHPOA (GAUZE/BANDAGES/DRESSINGS)
APL SWBSTK 6 STRL LF DISP (MISCELLANEOUS) ×3
APPLICATOR COTTON TIP 6 STRL (MISCELLANEOUS) ×4 IMPLANT
APPLICATOR COTTON TIP 6IN STRL (MISCELLANEOUS) ×4
APPLICATOR SURGIFLO ENDO (HEMOSTASIS) IMPLANT
BAG COUNTER SPONGE SURGICOUNT (BAG) IMPLANT
BAG LAPAROSCOPIC 12 15 PORT 16 (BASKET) ×4 IMPLANT
BAG RETRIEVAL 12/15 (BASKET) ×4
BAG SPNG CNTER NS LX DISP (BAG)
BAG URO CATCHER STRL LF (MISCELLANEOUS) ×4 IMPLANT
BENZOIN TINCTURE PRP APPL 2/3 (GAUZE/BANDAGES/DRESSINGS) IMPLANT
BLADE SURG SZ10 CARB STEEL (BLADE) IMPLANT
CATH FOL 2WAY LX 18X30 (CATHETERS) IMPLANT
CATH FOLEY 2WAY SLVR 18FR 30CC (CATHETERS) ×5 IMPLANT
CATH SILICONE 5CC 18FR (INSTRUMENTS) ×5 IMPLANT
CATH TIEMANN FOLEY 18FR 5CC (CATHETERS) ×4 IMPLANT
CELLS DAT CNTRL 66122 CELL SVR (MISCELLANEOUS) ×3 IMPLANT
CHLORAPREP W/TINT 26 (MISCELLANEOUS) ×5 IMPLANT
CLIP LIGATING HEM O LOK PURPLE (MISCELLANEOUS) ×23 IMPLANT
CLIP LIGATING HEMO LOK XL GOLD (MISCELLANEOUS) ×10 IMPLANT
CLIP LIGATING HEMO O LOK GREEN (MISCELLANEOUS) ×6 IMPLANT
CLOTH BEACON ORANGE TIMEOUT ST (SAFETY) ×5 IMPLANT
CNTNR URN SCR LID CUP LEK RST (MISCELLANEOUS) ×4 IMPLANT
CONT SPEC 4OZ STRL OR WHT (MISCELLANEOUS) ×4
COVER SURGICAL LIGHT HANDLE (MISCELLANEOUS) ×5 IMPLANT
COVER TIP SHEARS 8 DVNC (MISCELLANEOUS) ×4 IMPLANT
COVER TIP SHEARS 8MM DA VINCI (MISCELLANEOUS) ×4
CUTTER ECHEON FLEX ENDO 45 340 (ENDOMECHANICALS) ×5 IMPLANT
DERMABOND ADVANCED (GAUZE/BANDAGES/DRESSINGS) ×1
DERMABOND ADVANCED .7 DNX12 (GAUZE/BANDAGES/DRESSINGS) ×8 IMPLANT
DRAIN CHANNEL RND F F (WOUND CARE) IMPLANT
DRAIN PENROSE 0.5X18 (DRAIN) IMPLANT
DRAPE ARM DVNC X/XI (DISPOSABLE) ×16 IMPLANT
DRAPE COLUMN DVNC XI (DISPOSABLE) ×4 IMPLANT
DRAPE DA VINCI XI ARM (DISPOSABLE) ×16
DRAPE DA VINCI XI COLUMN (DISPOSABLE) ×4
DRAPE SURG IRRIG POUCH 19X23 (DRAPES) ×5 IMPLANT
DRSG TEGADERM 4X4.75 (GAUZE/BANDAGES/DRESSINGS) ×5 IMPLANT
ELECT PENCIL ROCKER SW 15FT (MISCELLANEOUS) ×5 IMPLANT
ELECT REM PT RETURN 15FT ADLT (MISCELLANEOUS) ×5 IMPLANT
GAUZE 4X4 16PLY ~~LOC~~+RFID DBL (SPONGE) IMPLANT
GAUZE SPONGE 2X2 8PLY STRL LF (GAUZE/BANDAGES/DRESSINGS) IMPLANT
GLOVE SURG ENC MOIS LTX SZ6.5 (GLOVE) ×10 IMPLANT
GLOVE SURG ENC TEXT LTX SZ7.5 (GLOVE) ×15 IMPLANT
GLOVE SURG UNDER POLY LF SZ7.5 (GLOVE) ×5 IMPLANT
HOLDER FOLEY CATH W/STRAP (MISCELLANEOUS) ×5 IMPLANT
IRRIG SUCT STRYKERFLOW 2 WTIP (MISCELLANEOUS) ×4
IRRIGATION SUCT STRKRFLW 2 WTP (MISCELLANEOUS) ×4 IMPLANT
IV LACTATED RINGERS 1000ML (IV SOLUTION) ×5 IMPLANT
KIT PROCEDURE DA VINCI SI (MISCELLANEOUS) ×4
KIT PROCEDURE DVNC SI (MISCELLANEOUS) ×4 IMPLANT
KIT TURNOVER KIT A (KITS) IMPLANT
LOOP VESSEL MAXI BLUE (MISCELLANEOUS) ×5 IMPLANT
MANIFOLD NEPTUNE II (INSTRUMENTS) ×5 IMPLANT
NDL ASPIRATION 22 (NEEDLE) ×4 IMPLANT
NDL INSUFFLATION 14GA 120MM (NEEDLE) ×4 IMPLANT
NDL SAFETY ECLIPSE 18X1.5 (NEEDLE) IMPLANT
NDL SPNL 22GX7 QUINCKE BK (NEEDLE) ×4 IMPLANT
NEEDLE ASPIRATION 22 (NEEDLE) ×4 IMPLANT
NEEDLE HYPO 18GX1.5 SHARP (NEEDLE) ×4
NEEDLE INSUFFLATION 14GA 120MM (NEEDLE) ×4 IMPLANT
NEEDLE SPNL 22GX7 QUINCKE BK (NEEDLE) ×4 IMPLANT
PACK CYSTO (CUSTOM PROCEDURE TRAY) ×4 IMPLANT
PACK ROBOT UROLOGY CUSTOM (CUSTOM PROCEDURE TRAY) ×5 IMPLANT
PAD POSITIONING PINK XL (MISCELLANEOUS) ×5 IMPLANT
PORT ACCESS TROCAR AIRSEAL 12 (TROCAR) ×4 IMPLANT
PORT ACCESS TROCAR AIRSEAL 5M (TROCAR) ×1
RELOAD STAPLE 45 4.1 GRN THCK (STAPLE) ×4 IMPLANT
RELOAD STAPLE 60 2.6 WHT THN (STAPLE) ×12 IMPLANT
RELOAD STAPLE 60 4.1 GRN THCK (STAPLE) ×12 IMPLANT
RELOAD STAPLER GREEN 60MM (STAPLE) ×15 IMPLANT
RELOAD STAPLER WHITE 60MM (STAPLE) ×21 IMPLANT
RETRACTOR LONRSTAR 16.6X16.6CM (MISCELLANEOUS) IMPLANT
RETRACTOR STAY HOOK 5MM (MISCELLANEOUS) IMPLANT
RETRACTOR STER APS 16.6X16.6CM (MISCELLANEOUS)
RETRACTOR WND ALEXIS 18 MED (MISCELLANEOUS) ×4 IMPLANT
RTRCTR WOUND ALEXIS 18CM MED (MISCELLANEOUS) ×4
SEAL CANN UNIV 5-8 DVNC XI (MISCELLANEOUS) ×16 IMPLANT
SEAL XI 5MM-8MM UNIVERSAL (MISCELLANEOUS) ×16
SET TRI-LUMEN FLTR TB AIRSEAL (TUBING) ×5 IMPLANT
SOLUTION ELECTROLUBE (MISCELLANEOUS) ×5 IMPLANT
SPIKE FLUID TRANSFER (MISCELLANEOUS) ×5 IMPLANT
SPONGE GAUZE 2X2 STER 10/PKG (GAUZE/BANDAGES/DRESSINGS) ×1
SPONGE T-LAP 18X18 ~~LOC~~+RFID (SPONGE) ×10 IMPLANT
SPONGE T-LAP 4X18 ~~LOC~~+RFID (SPONGE) ×5 IMPLANT
STAPLE RELOAD 45 GRN (STAPLE) ×3 IMPLANT
STAPLE RELOAD 45MM GREEN (STAPLE) ×4
STAPLER ECHELON LONG 60 440 (INSTRUMENTS) ×5 IMPLANT
STAPLER RELOAD GREEN 60MM (STAPLE) ×20
STAPLER RELOAD WHITE 60MM (STAPLE) ×28
STENT SET URETHERAL LEFT 7FR (STENTS) ×5 IMPLANT
STENT SET URETHERAL RIGHT 7FR (STENTS) ×5 IMPLANT
SURGIFLO W/THROMBIN 8M KIT (HEMOSTASIS) IMPLANT
SUT CHROMIC 4 0 RB 1X27 (SUTURE) ×5 IMPLANT
SUT ETHILON 3 0 PS 1 (SUTURE) ×5 IMPLANT
SUT MNCRL AB 4-0 PS2 18 (SUTURE) ×11 IMPLANT
SUT PDS AB 0 CTX 36 PDP370T (SUTURE) ×15 IMPLANT
SUT PDS AB 1 CT1 27 (SUTURE) ×12 IMPLANT
SUT SILK 3 0 SH 30 (SUTURE) IMPLANT
SUT SILK 3 0 SH CR/8 (SUTURE) ×5 IMPLANT
SUT V-LOC BARB 180 2/0GR6 GS22 (SUTURE)
SUT VIC AB 2-0 CT1 27 (SUTURE)
SUT VIC AB 2-0 CT1 27XBRD (SUTURE) IMPLANT
SUT VIC AB 2-0 SH 18 (SUTURE) IMPLANT
SUT VIC AB 2-0 SH 27 (SUTURE) ×4
SUT VIC AB 2-0 SH 27X BRD (SUTURE) ×4 IMPLANT
SUT VIC AB 2-0 UR5 27 (SUTURE) ×20 IMPLANT
SUT VIC AB 3-0 SH 27 (SUTURE) ×24
SUT VIC AB 3-0 SH 27X BRD (SUTURE) ×8 IMPLANT
SUT VIC AB 3-0 SH 27XBRD (SUTURE) ×16 IMPLANT
SUT VIC AB 4-0 RB1 27 (SUTURE) ×16
SUT VIC AB 4-0 RB1 27XBRD (SUTURE) ×16 IMPLANT
SUT VICRYL 0 UR6 27IN ABS (SUTURE) ×5 IMPLANT
SUT VLOC BARB 180 ABS3/0GR12 (SUTURE) ×12
SUTURE V-LC BRB 180 2/0GR6GS22 (SUTURE) IMPLANT
SUTURE VLOC BRB 180 ABS3/0GR12 (SUTURE) ×12 IMPLANT
SYR 27GX1/2 1ML LL SAFETY (SYRINGE) ×5 IMPLANT
SYR CONTROL 10ML LL (SYRINGE) IMPLANT
SYSTEM UROSTOMY GENTLE TOUCH (WOUND CARE) ×5 IMPLANT
TOWEL OR NON WOVEN STRL DISP B (DISPOSABLE) ×5 IMPLANT
TROCAR BLADELESS 15MM (ENDOMECHANICALS) ×5 IMPLANT
TROCAR XCEL NON-BLD 5MMX100MML (ENDOMECHANICALS) IMPLANT
TUBING CONNECTING 10 (TUBING) ×5 IMPLANT
WATER STERILE IRR 1000ML POUR (IV SOLUTION) ×5 IMPLANT
WATER STERILE IRR 3000ML UROMA (IV SOLUTION) ×5 IMPLANT

## 2022-03-27 NOTE — Brief Op Note (Signed)
03/27/2022 ? ?1:51 PM ? ?PATIENT:  Jerome Cruz  66 y.o. male ? ?PRE-OPERATIVE DIAGNOSIS:  BLADDER CANCER ? ?POST-OPERATIVE DIAGNOSIS:  BLADDER CANCER ? ?PROCEDURE:  Procedure(s) with comments: ?XI ROBOTIC ASSISTED LAPAROSCOPIC COMPLETE CYSTECT ILEAL CONDUIT (N/A) - 6 HRS ?PELVIC LYMPH NODE DISSECTION (Bilateral) ?XI ROBOTIC ASSISTED LAPAROSCOPIC RADICAL PROSTATECTOMY (N/A) ?CYSTOSCOPY WITH INJECTION OF INDOCYANINE GREEN DYE (N/A) ? ?SURGEON:  Surgeon(s) and Role: ?   Alexis Frock, MD - Primary ? ?PHYSICIAN ASSISTANT: Debbrah Alar PA ? ?ASSISTANTS: none  ? ?ANESTHESIA:   local and general ? ?EBL:  75 mL  ? ?BLOOD ADMINISTERED:none ? ?DRAINS:  1- JP to bulb; 2 - Urostomy to gravity with Rt (red) and Lt (blue) bander stents   ? ?LOCAL MEDICATIONS USED:  MARCAINE    ? ?SPECIMEN:  Source of Specimen:  1 - ureteral margins; 2- pelvic lymph nodes; 3 - bladder + prostate ? ?DISPOSITION OF SPECIMEN:  PATHOLOGY ? ?COUNTS:  YES ? ?TOURNIQUET:  * No tourniquets in log * ? ?DICTATION: .Other Dictation: Dictation Number [id number] ? ?PLAN OF CARE: Admit to inpatient  ? ?PATIENT DISPOSITION:  PACU - hemodynamically stable. ?  ?Delay start of Pharmacological VTE agent (>24hrs) due to surgical blood loss or risk of bleeding: yes     ?

## 2022-03-27 NOTE — Transfer of Care (Signed)
Immediate Anesthesia Transfer of Care Note ? ?Patient: Jerome Cruz ? ?Procedure(s) Performed: Procedure(s) with comments: ?XI ROBOTIC ASSISTED LAPAROSCOPIC COMPLETE CYSTECT ILEAL CONDUIT (N/A) - 6 HRS ?PELVIC LYMPH NODE DISSECTION (Bilateral) ?XI ROBOTIC ASSISTED LAPAROSCOPIC RADICAL PROSTATECTOMY (N/A) ?CYSTOSCOPY WITH INJECTION OF INDOCYANINE GREEN DYE (N/A) ? ?Patient Location: PACU ? ?Anesthesia Type:General ? ?Level of Consciousness: Alert, Awake, Oriented ? ?Airway & Oxygen Therapy: Patient Spontanous Breathing ? ?Post-op Assessment: Report given to RN ? ?Post vital signs: Reviewed and stable ? ?Last Vitals:  ?Vitals:  ? 03/27/22 0527 03/27/22 0748  ?BP: 129/89 (!) 138/92  ?Pulse: 89 82  ?Resp: 17 16  ?Temp: 36.9 ?C 36.7 ?C  ?SpO2: 93% 92%  ? ? ?Complications: No apparent anesthesia complications ? ?

## 2022-03-27 NOTE — Anesthesia Procedure Notes (Signed)
Procedure Name: Intubation ?Date/Time: 03/27/2022 9:14 AM ?Performed by: Gerald Leitz, CRNA ?Pre-anesthesia Checklist: Patient identified, Patient being monitored, Timeout performed, Emergency Drugs available and Suction available ?Patient Re-evaluated:Patient Re-evaluated prior to induction ?Oxygen Delivery Method: Circle system utilized ?Preoxygenation: Pre-oxygenation with 100% oxygen ?Induction Type: IV induction ?Ventilation: Mask ventilation without difficulty ?Laryngoscope Size: Mac and 3 ?Grade View: Grade I ?Tube type: Oral ?Tube size: 7.5 mm ?Number of attempts: 1 ?Placement Confirmation: ETT inserted through vocal cords under direct vision, positive ETCO2 and breath sounds checked- equal and bilateral ?Secured at: 21 cm ?Tube secured with: Tape ?Dental Injury: Teeth and Oropharynx as per pre-operative assessment  ? ? ? ? ?

## 2022-03-27 NOTE — Discharge Instructions (Signed)

## 2022-03-27 NOTE — H&P (Addendum)
Jerome Cruz is an 66 y.o. male.   ? ?Chief Complaint: Pre-OP Cystoprostatectomy ? ?HPI:  ? ?1 - Muscle Invasive Bladder Cancer - T2G3 poorly differentiated urothelial carcinoma by TURBT 09/2021 byt Dr. Claudia Desanctis. 4 cycles neoadjuvant gem-cis under care of Dr. Alen Blew ending 12/2021, restaging CT chest/abd/pelvis 01/2022 without metastatic disease. He does have some diverticulosis and there was some conernt on imaging at one point abtou possible CV fistula, but this has not been aparant clinically. Single ureters bilaterally.  ? ?2 -Bulbar Urethral Stricture - required OR dilation at time of TURBT 10/202.  ? ?PMH sig for ankle surgery, mild HLD. NO ischemci CV disese / blood thinners. His wife Jerome Cruz is very invovled. He is is farmer and raises cattle, very active. His PCP is [name] MD.  ? ?Today " Jerome Cruz" is seen to proceed with cystoprostatectomy, node dissection, and conduit diversion. He completed bowel prep to clear and stomal marking. Hgb 12.7, Cr 1.1. ? ? ? ?Past Medical History:  ?Diagnosis Date  ? Bladder tumor 09/26/2021  ? ED (erectile dysfunction)   ? GERD (gastroesophageal reflux disease)   ? Hyperlipidemia   ? Hypertension   ? ? ?Past Surgical History:  ?Procedure Laterality Date  ? ANKLE SURGERY Right   ? COLONOSCOPY  2012  ? COLONOSCOPY  03/27/2018  ? CYSTOSCOPY WITH URETHRAL DILATATION N/A 10/02/2021  ? Procedure: CYSTOSCOPY WITH URETHRAL BALLOON DILATATION;  Surgeon: Robley Fries, MD;  Location: [facility];  Service: Urology;  Laterality: N/A;  ? IR IMAGING GUIDED PORT INSERTION  10/24/2021  ? TRANSURETHRAL RESECTION OF BLADDER TUMOR WITH MITOMYCIN-C N/A 10/02/2021  ? Procedure: TRANSURETHRAL RESECTION OF BLADDER TUMOR WITH GEMCITABINE;  Surgeon: Robley Fries, MD;  Location: [facility];  Service: Urology;  Laterality: N/A;  90 MINS  ? ? ?Family History  ?Problem Relation Age of Onset  ? Diabetes Mother   ? Hypertension Mother   ? Cancer Father   ?     type unknown   ? Diabetes Mellitus I Son   ?     since age 60  ? Colon cancer Neg Hx   ? Rectal cancer Neg Hx   ? Stomach cancer Neg Hx   ? Esophageal cancer Neg Hx   ? ?Social History:  reports that he quit smoking about 20 years ago. His smoking use included cigarettes. He has a 15.00 pack-year smoking history. He has never used smokeless tobacco. He reports current alcohol use. He reports that he does not use drugs. ? ?Allergies:  ?Allergies  ?Allergen Reactions  ? Atorvastatin Other (See Comments)  ?  Pt unsure of reaction   ? Irbesartan Other (See Comments)  ?  Pt unsure of reaction   ? ? ?Medications Prior to Admission  ?Medication Sig Dispense Refill  ? cholecalciferol (VITAMIN D3) 25 MCG (1000 UNIT) tablet Take 1,000 Units by mouth daily.    ? esomeprazole (NEXIUM) 20 MG capsule Take 20 mg by mouth daily.    ? oxybutynin (DITROPAN) 5 MG tablet Take 1 tablet (5 mg total) by mouth every 8 (eight) hours as needed for bladder spasms. 30 tablet 3  ? simvastatin (ZOCOR) 20 MG tablet Take 20 mg by mouth at bedtime.    ? traMADol (ULTRAM) 50 MG tablet Take 1 tablet (50 mg total) by mouth every 6 (six) hours as needed. 20 tablet 0  ? valsartan (DIOVAN) 80 MG tablet Take 80 mg by mouth every evening.    ?  vitamin B-12 (CYANOCOBALAMIN) 1000 MCG tablet Take 1,000 mcg by mouth daily.    ? lidocaine-prilocaine (EMLA) cream Apply 1 application topically as needed. (Patient not taking: Reported on 03/21/2022) 30 g 0  ? prochlorperazine (COMPAZINE) 10 MG tablet Take 1 tablet (10 mg total) by mouth every 6 (six) hours as needed for nausea or vomiting. (Patient not taking: Reported on 03/21/2022) 30 tablet 0  ? Zoster Vaccine Adjuvanted [facility]) injection Shingrix (PF) 50 mcg/0.5 mL intramuscular suspension, kit ? ADM 0.5ML IM UTD    ? ? ?Results for orders placed or performed during the hospital encounter of 03/26/22 (from the past 48 hour(s))  ?CBC     Status: Abnormal  ? Collection Time: 03/26/22 11:51 AM  ?Result Value Ref Range  ? WBC  5.7 4.0 - 10.5 K/uL  ? RBC 3.78 (L) 4.22 - 5.81 MIL/uL  ? Hemoglobin 12.7 (L) 13.0 - 17.0 g/dL  ? HCT 37.1 (L) 39.0 - 52.0 %  ? MCV 98.1 80.0 - 100.0 fL  ? MCH 33.6 26.0 - 34.0 pg  ? MCHC 34.2 30.0 - 36.0 g/dL  ? RDW 12.1 11.5 - 15.5 %  ? Platelets 143 (L) 150 - 400 K/uL  ? nRBC 0.0 0.0 - 0.2 %  ?  Comment: Performed at [facility], [place] [address] [place], [place] [address]  ?Comprehensive metabolic panel     Status: None  ? Collection Time: 03/26/22 11:51 AM  ?Result Value Ref Range  ? Sodium 139 135 - 145 mmol/L  ? Potassium 3.7 3.5 - 5.1 mmol/L  ? Chloride 108 98 - 111 mmol/L  ? CO2 23 22 - 32 mmol/L  ? Glucose, Bld 95 70 - 99 mg/dL  ?  Comment: Glucose reference range applies only to samples taken after fasting for at least 8 hours.  ? BUN 20 8 - 23 mg/dL  ? Creatinine, Ser 1.15 0.61 - 1.24 mg/dL  ? Calcium 9.2 8.9 - 10.3 mg/dL  ? Total Protein 7.1 6.5 - 8.1 g/dL  ? Albumin 4.1 3.5 - 5.0 g/dL  ? AST 18 15 - 41 U/L  ? ALT 18 0 - 44 U/L  ? Alkaline Phosphatase 41 38 - 126 U/L  ? Total Bilirubin 0.5 0.3 - 1.2 mg/dL  ? GFR, Estimated >60 >60 mL/min  ?  Comment: (NOTE) ?Calculated using the CKD-EPI Creatinine Equation (2021) ?  ? Anion gap 8 5 - 15  ?  Comment: Performed at [facility], [place] [address], [place], [place] [address]  ?Prepare RBC (crossmatch)     Status: None  ? Collection Time: 03/26/22 11:51 AM  ?Result Value Ref Range  ? Order Confirmation    ?  ORDER PROCESSED BY BLOOD BANK ?Performed at [facility], [place] [address], [place], [place] [address] ?  ?Type and screen [place]     Status: None (Preliminary result)  ? Collection Time: 03/26/22 11:51 AM  ?Result Value Ref Range  ? ABO/RH(D) A POS   ? Antibody Screen NEG   ? Sample Expiration    ?  03/29/2022,2359 ?Performed at [facility], [place] [address], [place],  [address] ?  ? Unit Number [id number]   ? Blood Component Type RED CELLS,LR   ?  Unit division 00   ? Status of Unit ALLOCATED   ? Transfusion Status OK TO TRANSFUSE   ? Crossmatch Result Compatible   ? Unit Number [id number]   ? Blood  Component Type RED CELLS,LR   ? Unit division 00   ? Status of Unit ALLOCATED   ? Transfusion Status OK TO TRANSFUSE   ? Crossmatch Result Compatible   ? ?No results found. ? ?Review of Systems  ?Constitutional:  Negative for chills and fever.  ?All other systems reviewed and are negative. ? ?Blood pressure 129/89, pulse 89, temperature 98.4 ?F (36.9 ?C), temperature source Oral, resp. rate 17, height _0  (1.702 m), weight 72.6 kg, SpO2 93 %. ?Physical Exam ?Vitals reviewed.  ?HENT:  ?   Head: Normocephalic.  ?   Nose: Nose normal.  ?Eyes:  ?   Pupils: Pupils are equal, round, and reactive to light.  ?Cardiovascular:  ?   Rate and Rhythm: Normal rate.  ?Abdominal:  ?   General: Abdomen is flat.  ?   Comments: Stomal site noted.   ?Genitourinary: ?   Penis: Normal.   ?Musculoskeletal:     ?   General: Normal range of motion.  ?   Cervical back: Normal range of motion.  ?Skin: ?   General: Skin is warm.  ?Neurological:  ?   General: No focal deficit present.  ?   Mental Status: He is alert.  ?  ? ?Assessment/Plan ? ?Proceed as planned with cysto/ICG, robotic cystoprostattectomy, node dissection, conduit diversion with curative intent. Risks, benefits, alternatives, expected peri-op course discussed extensively previously and reiterated today.  ? ?Alexis Frock, MD ?03/27/2022, 6:52 AM ? ? ? ?

## 2022-03-27 NOTE — Progress Notes (Signed)
Received message from OR nurse that pt does not have orders for inpatient status.  Notified Dr. Tresa Moore by phone at [phone].   ?

## 2022-03-27 NOTE — Anesthesia Postprocedure Evaluation (Signed)
Anesthesia Post Note ? ?Patient: Jerome Cruz ? ?Procedure(s) Performed: XI ROBOTIC ASSISTED LAPAROSCOPIC COMPLETE CYSTECT ILEAL CONDUIT (Abdomen) ?PELVIC LYMPH NODE DISSECTION (Bilateral: Abdomen) ?XI ROBOTIC ASSISTED LAPAROSCOPIC RADICAL PROSTATECTOMY (Abdomen) ?CYSTOSCOPY WITH INJECTION OF INDOCYANINE GREEN DYE ? ?  ? ?Patient location during evaluation: PACU ?Anesthesia Type: General ?Level of consciousness: awake and alert ?Pain management: pain level controlled ?Vital Signs Assessment: post-procedure vital signs reviewed and stable ?Respiratory status: spontaneous breathing, nonlabored ventilation, respiratory function stable and patient connected to nasal cannula oxygen ?Cardiovascular status: blood pressure returned to baseline and stable ?Postop Assessment: no apparent nausea or vomiting ?Anesthetic complications: no ? ? ?No notable events documented. ? ?Last Vitals:  ?Vitals:  ? 03/27/22 1500 03/27/22 1524  ?BP: 127/81 99/87  ?Pulse: 79 86  ?Resp: 15 20  ?Temp: (!) 36.3 ?C 36.5 ?C  ?SpO2: 95% 93%  ?  ?Last Pain:  ?Vitals:  ? 03/27/22 1548  ?TempSrc:   ?PainSc: 10-Worst pain ever  ? ? ?  ?  ?  ?  ?  ?  ? ?Jerome Cruz ? ? ? ? ?

## 2022-03-28 ENCOUNTER — Encounter (HOSPITAL_COMMUNITY): Payer: Self-pay | Admitting: Urology

## 2022-03-28 LAB — HEMOGLOBIN AND HEMATOCRIT, BLOOD
HCT: 31.8 % — ABNORMAL LOW (ref 39.0–52.0)
Hemoglobin: 10.8 g/dL — ABNORMAL LOW (ref 13.0–17.0)

## 2022-03-28 LAB — BASIC METABOLIC PANEL
Anion gap: 6 (ref 5–15)
BUN: 26 mg/dL — ABNORMAL HIGH (ref 8–23)
CO2: 25 mmol/L (ref 22–32)
Calcium: 8.4 mg/dL — ABNORMAL LOW (ref 8.9–10.3)
Chloride: 107 mmol/L (ref 98–111)
Creatinine, Ser: 1.25 mg/dL — ABNORMAL HIGH (ref 0.61–1.24)
GFR, Estimated: >60 mL/min (ref >60)
Glucose, Bld: 137 mg/dL — ABNORMAL HIGH (ref 70–99)
Potassium: 4.4 mmol/L (ref 3.5–5.1)
Sodium: 138 mmol/L (ref 135–145)

## 2022-03-28 MED ORDER — SODIUM CHLORIDE 0.9 % IV SOLN: INTRAVENOUS | Status: DC

## 2022-03-28 NOTE — Consult Note (Signed)
[facility] Nurse ostomy consult note ?Stoma type/location: RUQ, ileal conduit ?Stomal assessment/size: blue/red stent in place  ?Peristomal assessment: NA ?Treatment options for stomal/peristomal skin: NA ?Output pink, urine ?Ostomy pouching: 2pc. 2 1/4" in place in the OR ?Education provided:  ?Met with patient, plans for pouch change tomorrow with wife present ?Patient is very engaged to learn.  ?Explained role of ostomy nurse and creation of stoma  ?Explained stoma characteristics (budded, flush, color, texture, care) ?Discussed measurement of stoma ?Education on emptying when 1/3 to 1/2 full and how to empty ?Demonstrated hooking pouch to nighttime drainage bag; patient practiced opening and closing pouch and hook up ?Discussed risk of peristomal hernia ?Extensive discussion about 1pc vs 2pc.  Patient desires to wear Stealth belt. Will need to wear pouch side ways for use.  Discussed and patient familiar with stomaguard as well.  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  We discussed ways to manage stoma with clothing and pouch covers.  ?Enrolled patient in [facility] program: Yes ? ?Plan visit with patient and his wife tomorrow.  ? ?[facility] Nurse will follow along with you for continued support with ostomy teaching and care ?Baldomero Mirarchi Liane Comber MSN, RN, [place], [place], [place] ?[phone]  ? ?

## 2022-03-28 NOTE — TOC Initial Note (Addendum)
Transition of Care (TOC) - Initial/Assessment Note  ? ? ?Patient Details  ?Name: Jerome Cruz ?MRN: [id number] ?Date of Birth: 10/24/1956 ? ?Transition of [facility]) CM/SW Contact:    ?Dessa Phi, RN ?Phone Number: ?03/28/2022, 10:43 AM ? ?Clinical Narrative: Noted ileal conduit-may need HHRN will await recc, & will proactively check on [facility] agency to accept.  ?-2p-[facility] rep Ken[place] following if HHRN needed.               ? ? ?Expected Discharge Plan: [place] ?Barriers to Discharge: Continued Medical Work up ? ? ?Patient Goals and CMS Choice ?Patient states their goals for this hospitalization and ongoing recovery are:: Home ?CMS Medicare.gov Compare Post Acute Care list provided to:: Patient Represenative (must comment) ?Choice offered to / list presented to : Spouse ? ?Expected Discharge Plan and Services ?Expected Discharge Plan: [place] ?  ?Discharge Planning Services: CM Consult ?Post Acute Care Choice: Home Health ?Living arrangements for the past 2 months: [place] ?                ?  ?  ?  ?  ?  ?  ?  ?  ?  ?  ? ?Prior Living Arrangements/Services ?Living arrangements for the past 2 months: [place] ?Lives with:: Spouse ?Patient language and need for interpreter reviewed:: Yes ?Do you feel safe going back to the place where you live?: Yes      ?Need for Family Participation in Patient Care: Yes (Comment) ?Care giver support system in place?: Yes (comment) ?  ?Criminal Activity/Legal Involvement Pertinent to Current Situation/Hospitalization: No - Comment as needed ? ?Activities of Daily Living ?Home Assistive Devices/Equipment: Blood pressure cuff, Eyeglasses ?ADL Screening (condition at time of admission) ?Patient's cognitive ability adequate to safely complete daily activities?: Yes ?Is the patient deaf or have difficulty hearing?: No ?Does the patient have difficulty seeing, even when wearing glasses/contacts?: No ?Does the patient have difficulty  concentrating, remembering, or making decisions?: No ?Patient able to express need for assistance with ADLs?: Yes ?Does the patient have difficulty dressing or bathing?: No ?Independently performs ADLs?: Yes (appropriate for developmental age) ?Does the patient have difficulty walking or climbing stairs?: No ?Weakness of Legs: None ?Weakness of Arms/Hands: None ? ?Permission Sought/Granted ?Permission sought to share information with : Case Manager ?Permission granted to share information with : Yes, Verbal Permission Granted ? Share Information with NAME: Case manager ?   ?   ?   ? ?Emotional Assessment ?Appearance:: Appears stated age ?Attitude/Demeanor/Rapport: Gracious ?Affect (typically observed): Accepting ?Orientation: : Oriented to Self, Oriented to Place, Oriented to  Time, Oriented to Situation ?Alcohol / Substance Use: Not Applicable ?Psych Involvement: No (comment) ? ?Admission diagnosis:  Bladder cancer ([place]) [C67.9] ?Patient Active Problem List  ? Diagnosis Date Noted  ? Port-A-Cath in place 10/30/2021  ? Bladder cancer ([place]) 10/17/2021  ? Malignant neoplasm of urinary bladder ([place]) 10/17/2021  ? Foot pain 04/20/2012  ? General medical examination 03/06/2012  ? HYPERTENSION 09/27/2008  ? GERD 09/27/2008  ? ?PCP:  [name], MD ?Pharmacy:   ?[id number] [address], [address] AT [facility] [address] ?[place] ?[place] [address] ?Phone: [phone] Fax: [phone] ? ? ? ? ?Social Determinants of Health (SDOH) Interventions ?  ? ?Readmission Risk Interventions ?   ? View : No data to display.  ?  ?  ?  ? ? ? ?

## 2022-03-28 NOTE — Progress Notes (Signed)
1 Day Post-Op  ? ?Subjective/Chief Complaint: ? ?1 - Bladder Cancer - s/p cysto/ICG + robotic cystoprostatectomy/node disssection + ileal conduit urinary diversion 03/27/22. Path penging. Admitted 4/4 for bowel prep and stomal marking. ? ?2 - Ileus / Return of Bowel Function - pt with planned small bowel anastamosis as part of ileal conduit 03/27/22. Received peri-op entereg. NPO with ice chips initially. ? ?3 - Disposition / Rehab -independent at [facility] and does all ADL's. PT eval pending. ? ?Today "Jerome Cruz" is stable. Hgb >10, Cr <1.5, no fevers.  ? ?Objective: ?Vital signs in last 24 hours: ?Temp:  [97.4 ?F (36.3 ?C)-98.5 ?F (36.9 ?C)] 98.5 ?F (36.9 ?C) (04/06 9628) ?Pulse Rate:  [73-86] 74 (04/06 0742) ?Resp:  [15-24] 16 (04/06 0742) ?BP: (93-127)/(66-87) 110/72 (04/06 3662) ?SpO2:  [88 %-100 %] 88 % (04/06 0742) ?Last BM Date : 03/26/22 ? ?Intake/Output from previous day: ?04/05 0701 - 04/06 0700 ?In: 3227.2 [P.O.:60; I.V.:2745.7; IV Piggyback:421.5] ?Out: 9476 [Urine:1100; Drains:605; Blood:75] ?Intake/Output this shift: ?Total I/O ?In: -  ?Out: 13 [Drains:50] ? ?NAD, AOx3, very pleasant, near baseline ?Non-labored breathing on RA ?RRR ?RLQ urostomy pink/patent with Rt (red) and Lt (blue) bander stents and copious non-foul light pink urine ?Recent surgical sites c/d/1 ?JP with scant serosanguinous output that is non-foul ?Mild penoscrotal edema as expected.  ?No c/c/e ? ?Lab Results:  ?Recent Labs  ?  03/26/22 ?1151 03/27/22 ?1430 03/28/22 ?0401  ?WBC 5.7  --   --   ?HGB 12.7* 11.9* 10.8*  ?HCT 37.1* 34.8* 31.8*  ?PLT 143*  --   --   ? ?BMET ?Recent Labs  ?  03/26/22 ?1151 03/28/22 ?0401  ?NA 139 138  ?K 3.7 4.4  ?CL 108 107  ?CO2 23 25  ?GLUCOSE 95 137*  ?BUN 20 26*  ?CREATININE 1.15 1.25*  ?CALCIUM 9.2 8.4*  ? ?PT/INR ?No results for input(s): LABPROT, INR in the last 72 hours. ?ABG ?No results for input(s): PHART, HCO3 in the last 72 hours. ? ?Invalid input(s): PCO2, PO2 ? ?Studies/Results: ?No results  found. ? ?Anti-infectives: ?Anti-infectives (From admission, onward)  ? ? Start     Dose/Rate Route Frequency Ordered Stop  ? 03/27/22 1700  piperacillin-tazobactam (ZOSYN) IVPB 3.375 g       ? 3.375 g ?12.5 mL/hr over 240 Minutes Intravenous Every 8 hours 03/27/22 1538 03/28/22 1659  ? 03/27/22 0600  piperacillin-tazobactam (ZOSYN) IVPB 3.375 g       ? 3.375 g ?100 mL/hr over 30 Minutes Intravenous 30 min pre-op 03/26/22 1109 03/27/22 0926  ? 03/26/22 1400  metroNIDAZOLE (FLAGYL) tablet 500 mg       ? 500 mg Oral Every 4 hours 03/26/22 1256 03/26/22 2300  ? 03/26/22 1345  neomycin (MYCIFRADIN) tablet 500 mg  Status:  Discontinued       ? 500 mg Oral Every 4 hours 03/26/22 1256 03/26/22 1300  ? ?  ? ? ?Assessment/Plan: ? ?Doing well POD 1 s/p major surgery for bladder cancer. Continue ice chips today. PT eval for return to ambulation. Continue IVF, daily labs. Greatly appreciate ostomy RN team comanagement.  ? ? ? ?Jerome Cruz ?03/28/2022 ? ?

## 2022-03-28 NOTE — Op Note (Signed)
NAME: Jerome Cruz, SHERTZER MEDICAL RECORD NO: [id number] ACCOUNT NO: [id number] DATE OF BIRTH: 28-Jun-1956 FACILITY: [facility] LOCATION: [id number] PHYSICIAN: Sebastian Ache, MD  Operative Report   DATE OF PROCEDURE: 03/27/2022  PREOPERATIVE DIAGNOSIS:  Muscle invasive bladder cancer.  PROCEDURE PERFORMED:  1.  Cystoscopy with injection of indocyanine green dye. 2.  Robotic-assisted laparoscopic radical prostatectomy. 3.  Robotic-assisted laparoscopic radical cystectomy with ileal conduit urinary diversion. 4.  Bilateral pelvic lymphadenectomy.  ESTIMATED BLOOD LOSS:  75 mL.  COMPLICATIONS:  None.  ASSISTANT:  Harrie Foreman, PA  SPECIMENS:  1.  Right external iliac lymph nodes, right obturator lymph nodes. 2.  Right internal iliac lymph nodes. 3.  Left internal iliac lymph nodes. 4.  Left common iliac lymph nodes. 5.  Left external iliac lymph nodes, sentinel 6.  Right distal ureteral margin frozen section negative. 7.  Right final distal ureteral margin. 8.  Left distal ureteral margin frozen section negative. 9.  Final left distal ureteral margin. 10.  Bladder plus prostate en bloc.  FINDINGS:  1.  Fibrinous tissue with some nodularity of the bladder in the left lateral bladder area consistent with likely small volume residual bladder cancer. 2.  Sentinel lymph nodes in the left external iliac chain. 3.  Single ureters bilaterally.  INDICATIONS:  Rod is a very pleasant 66 year old man with a recent history of muscle invasive bladder cancer, that is clinically localized.  He has been on curative path for several months, status post maximal transurethral resection with neoadjuvant  chemotherapy which he tolerated exceptionally well.  Restaging imaging revealed no evidence of locally advanced or distant disease.  Options were discussed for surgical portion of his path with cystectomy and various forms of urinary diversion.  We  agreed upon cystoprostatectomy with conduit diversion.  He was  admitted yesterday for bowel prep, stomal marking and labs which were all favorable.  Informed consent was obtained and placed in medical record.  PROCEDURE IN DETAIL:  The patient being verified, procedure being cystoscopy with injection of indocyanine green dye, robotic cystoprostatectomy with conduit diversion and node dissection was confirmed.  Procedure timeout was performed.  Intravenous  antibiotics were administered.  General endotracheal anesthesia induced.  The patient was placed into a low lithotomy position.  Sterile field was created, prepped and draped the patient's penis, perineum, and proximal thighs using iodine and his  infraxiphoid abdomen using chlorhexidine gluconate, after he was clipper shaved and further fashioned to the operating table using 3-inch tape with foam padding across supraxiphoid chest, gel rolls were applied to his arms.  A test steep Trendelenburg  positioning was performed and found to be suitably positioned.  An LAVH type drape was applied.  Initial attention was directed at cystoscopy with injection of indocyanine green dye.  Cystourethroscopy was performed using 24-French injection scope set  with 0-degree lens.  Inspection of anterior and posterior urethra was relatively unremarkable.  There was a very small bulbar stricture that was navigated with the scope.  Inspection of urinary bladder did reveal an area of fibrinous tissue and rolled  edges and slight nodularity on the left lateral bladder area consistent likely with area of tumor.  Next, 1 mL of indocyanine green dye was injected across approximately 6 mucosal blebs in a circumferential fashion around the area of tumor to allow for  sentinel lymphangiography and tumor marking.  An 18-French silicone type Foley catheter was placed per urethra for straight drain, 10 mL water in the balloon.  Next, a high flow, low pressure  pneumoperitoneum was obtained using Veress technique in the  supraumbilical midline,  having passed the aspiration and drop test.  An 8 mm robotic camera port was then placed in the same location.  Laparoscopic examination of peritoneal cavity revealed no significant adhesions, no visceral injury.  There was  significant redundancy of the sigmoid.  Additional ports were placed follows:  Right paramedian 8 mm robotic port, right far lateral 12 mm AirSeal assist port, right paramedian 15 mm assistant port at the area of the previously marked stomal site, left  paramedian 8 mm robotic port, left far lateral 8 mm robotic port.  Robot was docked and passed the electronic checks.  Initial attention was directed at taking down some of the loose sigmoid adhesions.  There was some significant sigmoid redundancy and  some loose adhesions to the left lower quadrant.  These were taken down with sharp robotic scissors, which allowed significant straightening of the sigmoid and less mass effect in the deep pelvis, making the pelvic anatomy more favorable.  Incision was  made lateral to the left median umbilical ligament from the anterior abdominal wall coursing along the iliac vessels and superiorly lateral to the descending colon to a distance of approximately 10 cm above the iliac crossing.  This created a large  retroperitoneal flap, which was retracted medially. Left ureter was encountered as it coursed over the iliac vessels, marked with vessel loop, dissected proximally to the area of the gonadal crossing and then distally to the ureterovesical junction,  which was doubly clipped and ligated with the proximal clip containing a dyed tag suture.  Frozen section was obtained and negative for carcinoma. Left ureter was tucked our of the  true pelvis.  The left pelvis was then inspected under near infrared fluorescence light.  Sentinel lymphangiography revealed excellent uptake of the area of the bladder and tumor that was clearly visualized on the left side of the bladder.  There were several lymphatic  channels, sentinel type, coursing mostly to the left sided lymph node  field, with several dominant sentinel nodes in the left external packet. Standard template lymphadenectomy was then performed of left external iliac group with the boundaries being left external iliac artery, vein, pelvic sidewall, iliac bifurcation.   Lymphostasis was achieved with cold clips, set aside labeled left external iliac lymph nodes sentinel.  Next, left common iliac group was dissected free with the boundaries being iliac bifurcation and aortic bifurcation.  Lymphostasis achieved with cold  clips, set aside labeled left common iliac lymph nodes. Left internal group was dissected free with the boundaries being iliac bifurcation and superior vesical artery.  Lymphostasis was achieved with cold clips, set aside labeled left internal iliac  lymph nodes.  Next, left obturator group was dissected free with the boundaries being left external iliac vein, pelvic sidewall, obturator nerve.  Lymphostasis was achieved with cold clips, set aside labeled left obturator lymph nodes.  Left obturator  nerve was inspected following the maneuvers and found to be uninjured and set aside labeled left obturator lymph nodes. Left lateral bladder wall swept away from the pelvic sidewall towards the area of the endopelvic fascia, which was then swept away  from the lateral aspect of the prostate towards the area of the apex of the prostate.  This completed the left retroperitoneal and lateral dissection.  Attention was directed at the right side. First the area of the ileocecal junction was identified, by  verifying the appendix and the cecum and the ileum, inserting into  this. The distal ileum was then traced superiorly for a distance of approximately 20 cm to an area that appeared to have sufficient mobility for conduit segment.  This was marked using a  silk tag Hem-o-lok clip with a separate clip placed distal to this to denote proximal to distal  orientation.  An incision was then made in the retroperitoneum lateral to the cecum, extending inferiorly, coursing along the iliac vessels and then lateral  to right medial umbilical ligament towards the area of the anterior abdominal wall. A Y-shaped extension was made off of this, coursing along the iliac vessels towards the area of the aortic bifurcation.  This created a large retroperitoneal flap on the  right side which retracted medially. The right ureter was encountered as it coursed over the iliac vessels, marked with vessel loop, dissected proximally to the gonadal crossing and then distally to the ureterovesical junction, doubly clipped and ligated  with proximal clip containing a white tagged suture.  Frozen section negative for carcinoma.  This right ureter was tuck our of the  true pelvis.  The right pelvis was inspected under near infrared fluorescence light.  Sentinel lymphangiography revealed no obvious  sentinel lymph nodes within the right side.  As such, standard template lymphadenectomy was performed on the right side as per the left, of the right external iliac, right internal iliac, right obturator, right common iliac lymph nodes respectively.   Right obturator nerve was inspected following the maneuvers and found to be uninjured. Right bladder wall was swept away from the pelvic sidewall towards the endopelvic fascia which was then swept away from the lateral aspect of the prostate towards the  area of apex of the prostate.  This completed right retroperitoneal dissection.  Next, the left ureter was reperitonealized by creating a retroperitoneal tunnel just anterior to the aortic bifurcation via the right sided retroperitoneal view, the left  side distal left ureter tag was grasped, brought through this tunnel to the right side, and the right ureter, left ureter terminal ileum tag sutures were placed into Hem-o-lok clip and tucked out of the true pelvis.  Attention was directed at   posterior dissection of the bladder and an inverted U incision was made in the posterior peritoneum overlying the area of the bladder connecting the 2 previous lateral peritoneal incisions and the posterior flap was created.  Dissection proceeded within  this plane inferior to the vas, seminal vesicles all the way to the apex of the prostate.  This was then swept laterally, thus developing the vascular pedicles of the bladder and prostate.  These were controlled using white load stapler x2 each side,  taking exquisite care to avoid any injury to bowel or to the obturator nerves, which did not occur.  This provided excellent hemostatic control of the vascular pedicles bilaterally of the bladder and prostate.  Next, the anterior dissection was performed  by developing the space of Retzius, beginning at the medial umbilical ligaments all the way down to the dorsal venous complex, it was controlled using green load stapler.  The bladder plus prostate was placed on superior traction and final apical  dissection was performed by transecting the membranous urethra coldly for 50% of its circumference, placed into extra large Hem-o-lok clips on the Foley catheter, which was then purposely ligated, used as a bucket handle and the posterior circumference  of the membranous urethra was transected.  This completely freed up the bladder plus prostate en bloc specimen, it was then placed into  extra large EndoCatch bag for later retrieval.  Pelvis was then inspected under laparoscopic vision.  Hemostasis was  excellent.  Sponge and needle counts were correct.  Digital rectal exam was performed using indicator glove under laparoscopic vision.  No evidence of rectal violation was noted.  A closed suction drain was brought out through the previous left lateral  most robotic port site into the area of the peritoneal cavity.  The specimen with its tag string was placed into the left side of the hemi-abdomen, tag string brought  through the previous left paramedian robotic port site and the right ureter, left  ureter, terminal ileum tag sutures were grasped with a laparoscopic grasper via the 15 mm port site on the right side, such that these structures did not cross with the specimen.  Robot was then undocked.  Specimen was retrieved by extending the previous  camera port site inferiorly for a total distance of approximately 6 cm erring to the left side of the umbilicus.  The bladder plus prostate en bloc specimen was removed, set aside for permanent pathology.  A wound protector type retractor was then  placed.  The right ureter, left ureter, terminal ileum tag sutures were then grasped, brought through this and then separately tagged with Babcock graspers each. Attention was directed at conduit harvest. The previously denoted margin for conduit  formation did appear to be of sufficient mobility and vascularity for conduit formation.  Despite the patient's relatively nonobese habitus, the mesentery was somewhat short and less mobile than expected; however, the geometry was still favorable for  conduit formation at the previously marked stomal site, which was quite high.  Next, a 14 cm segment of terminal ileum was taken out of continuity using green load stapler proximal and distal corresponding to the previously marked area.  The mesentery  was developed with 1.5 loads vascular stapler distal, 1 load proximal, taking exquisite care to avoid devascularization of the conduit or the anastomotic segments.  Conduit segment was then aligned to retroperitoneal orientation and bowel-bowel  anastomosis was performed using 1.5 loads of green load stapler on the antimesenteric border.  The free end was oversewn using running silk with a second imbricating layer of running silk.  The acute angled anastomosis was then bolstered with interrupted  silk.  The mesenteric defect was reapproximated using interrupted silk.  The bowel-bowel  anastomosis remained visibly viable, palpably patent.  I was quite happy with this.  It was redelivered into the abdominal cavity.  Attention was directed more at  the conduit.  Distal staple line was removed as were the tagged clips. Proximal staple line was excluded using running Vicryl.  Attention was directed at ureteroenteric anastomosis, first of the left ureter.  A 4 mm segment of proximal conduit was  excised using Potts scissors, more towards the mesenteric side.  Four mucosal everting sutures of 4-0 Vicryl were placed.  Ureter was spatulated to length.  A heel stitch was applied of interrupted Vicryl and then 2 separate running suture lines of 4-0  Vicryl were used in a heel-to-toe fashion after placing a blue color Bander stent at 25 cm anastomosis on the left side.  This provided an excellent tension-free ureteroenteric anastomosis. The final ureteral margin was inked, set aside for permanent  pathology.  Similarly, the right ureter was anastomosed in an area slightly distal to this in the conduit in a similar fashion, the heel-to-toe fashion ureteroenteric anastomosis sutures, with a red color Bander stent placed at 25 cm  anastomosis and the  right final margin set aside after inking.  I was quite happy with the ureteroenteric anastomoses and the geometry of this despite the relatively short mesentery.  Next, attention was directed at stomal formation.  A quarter-sized diameter column of skin  and subcutaneous tissue was excised from the area of the previously marked stomal site at the level of the fascia, was dilated to accommodate 2 surgeon's fingers and the Bander stents and distal conduit were brought through this, such that the mesentery  lie in anatomic position.  Four fascial anchoring sutures were applied in a quadrant fashion at the level of proximal conduit to prevent parastomal hernia formation and then 4 rosebudding sutures were similarly applied and then set aside.  The abdomen  was  once again inspected via the extraction site.  Hemostasis was excellent. Sponge, needle and instrument counts were correct.  Omentum was brought over this.  Fascia was reapproximated using figure-of-eight PDS x7 followed by reapproximation of  Scarpa's with running Vicryl.  All incision sites were infiltrated with dilute lipolyzed Marcaine and closed at the level of the skin using subcuticular Monocryl followed by Dermabond.  Final conduit maturation was then performed by tying down the  rosebudding sutures, which provided satisfactory rosebudding of the stoma, although the stoma is not significantly protuberant given the short mesentery. The further conduit to skin approximation was performed in a quadrant fashion with interrupted  Vicryl x3.  Stomal appliance was placed after the Bander stents were trimmed to length, it remained visibly viable and pink.  Drain stitch was applied as the procedure  was terminated.  The patient tolerated the procedure well, no immediate perioperative  complications.  The patient was taken to postanesthesia care in stable condition.  Plan for progressive care admission.  Please note, first assistant, Harrie Foreman, was crucial for all portions of the surgery today.  She provided invaluable retraction, suctioning, vascular clipping, vascular stapling and general first assistance.   SHW D: 03/27/2022 2:09:10 pm T: 03/28/2022 3:31:00 am  JOB: [id number]/ [id number]

## 2022-03-29 LAB — HEMOGLOBIN AND HEMATOCRIT, BLOOD
HCT: 28.8 % — ABNORMAL LOW (ref 39.0–52.0)
Hemoglobin: 9.8 g/dL — ABNORMAL LOW (ref 13.0–17.0)

## 2022-03-29 LAB — BASIC METABOLIC PANEL
Anion gap: 8 (ref 5–15)
BUN: 17 mg/dL (ref 8–23)
CO2: 24 mmol/L (ref 22–32)
Calcium: 8.1 mg/dL — ABNORMAL LOW (ref 8.9–10.3)
Chloride: 105 mmol/L (ref 98–111)
Creatinine, Ser: 1.02 mg/dL (ref 0.61–1.24)
GFR, Estimated: >60 mL/min (ref >60)
Glucose, Bld: 93 mg/dL (ref 70–99)
Potassium: 4 mmol/L (ref 3.5–5.1)
Sodium: 137 mmol/L (ref 135–145)

## 2022-03-29 MED ORDER — ACETAMINOPHEN 500 MG PO TABS
1000.0000 mg | ORAL_TABLET | Freq: Three times a day (TID) | ORAL | Status: DC
  Administered 2022-03-29 – 2022-04-01 (×11): 1000 mg via ORAL
  Filled 2022-03-29 (×12): qty 2

## 2022-03-29 NOTE — Progress Notes (Signed)
2 Days Post-Op  ? ?Subjective/Chief Complaint: ? ?1 - Bladder Cancer - s/p cysto/ICG + robotic cystoprostatectomy/node disssection + ileal conduit urinary diversion 03/27/22. Path penging. Admitted 4/4 for bowel prep and stomal marking. ? ?2 - Ileus / Return of Bowel Function - pt with planned small bowel anastamosis as part of ileal conduit 03/27/22. Received peri-op entereg. NPO with ice chips initially. Advanced to clears 4/7.  ? ?3 - Disposition / Rehab -independent at [facility] and does all ADL's. PT eval pending. ? ?Today "Rod" is continuing to progress. No emesis. Tolerating ice chips.  ? ? ?Objective: ?Vital signs in last 24 hours: ?Temp:  [98.8 ?F (37.1 ?C)-99.3 ?F (37.4 ?C)] 99.3 ?F (37.4 ?C) (04/07 0516) ?Pulse Rate:  [74-92] 91 (04/07 0516) ?Resp:  [16-18] 16 (04/07 0516) ?BP: (119-133)/(69-78) 130/74 (04/07 0516) ?SpO2:  [91 %-94 %] 91 % (04/07 0516) ?Last BM Date : 03/26/22 ? ?Intake/Output from previous day: ?04/06 0701 - 04/07 0700 ?In: 699.4 [I.V.:599.4; IV Piggyback:100] ?Out: 2490 [Urine:2250; Drains:240] ?Intake/Output this shift: ?Total I/O ?In: -  ?Out: 300 [Urine:300] ? ?NAD, AOx3, very pleasant, near baseline. Wife at bedside.  ?Non-labored breathing on RA ?RRR ?RLQ urostomy pink/patent with Rt (red) and Lt (blue) bander stents and copious non-foul light pink urine ?Recent surgical sites c/d/1 ?JP with scant serosanguinous output that is non-foul ?Mild penoscrotal edema as expected.  ?No c/c/e ? ?Lab Results:  ?Recent Labs  ?  03/26/22 ?1151 03/27/22 ?1430 03/28/22 ?0401 03/29/22 ?0354  ?WBC 5.7  --   --   --   ?HGB 12.7*   < > 10.8* 9.8*  ?HCT 37.1*   < > 31.8* 28.8*  ?PLT 143*  --   --   --   ? < > = values in this interval not displayed.  ? ?BMET ?Recent Labs  ?  03/28/22 ?0401 03/29/22 ?0354  ?NA 138 137  ?K 4.4 4.0  ?CL 107 105  ?CO2 25 24  ?GLUCOSE 137* 93  ?BUN 26* 17  ?CREATININE 1.25* 1.02  ?CALCIUM 8.4* 8.1*  ? ?PT/INR ?No results for input(s): LABPROT, INR in the last 72 hours. ?ABG ?No  results for input(s): PHART, HCO3 in the last 72 hours. ? ?Invalid input(s): PCO2, PO2 ? ?Studies/Results: ?No results found. ? ?Anti-infectives: ?Anti-infectives (From admission, onward)  ? ? Start     Dose/Rate Route Frequency Ordered Stop  ? 03/27/22 1700  piperacillin-tazobactam (ZOSYN) IVPB 3.375 g       ? 3.375 g ?12.5 mL/hr over 240 Minutes Intravenous Every 8 hours 03/27/22 1538 03/28/22 1429  ? 03/27/22 0600  piperacillin-tazobactam (ZOSYN) IVPB 3.375 g       ? 3.375 g ?100 mL/hr over 30 Minutes Intravenous 30 min pre-op 03/26/22 1109 03/27/22 0926  ? 03/26/22 1400  metroNIDAZOLE (FLAGYL) tablet 500 mg       ? 500 mg Oral Every 4 hours 03/26/22 1256 03/26/22 2300  ? 03/26/22 1345  neomycin (MYCIFRADIN) tablet 500 mg  Status:  Discontinued       ? 500 mg Oral Every 4 hours 03/26/22 1256 03/26/22 1300  ? ?  ? ? ?Assessment/Plan: ? ?Doing well POD 2. Decrease IVF to 1/2 MIVF, clears, continue ambulation. Appreciate ostomy team comanagement.  ? ?Alexis Frock ?03/29/2022 ? ?

## 2022-03-29 NOTE — Evaluation (Signed)
Physical Therapy Evaluation ?Patient Details ?Name: Jerome Cruz ?MRN: [id number] ?DOB: [birth year] ?Today's Date: 03/29/2022 ? ?History of Present Illness ? 66 y.o. male admitted 03/26/22 with bladder cancer. s/p cystoprostatectomy, node dissection, and conduit diversion 03/28/22. PMH includes: GERD, HTN, ankle surgery.  ?Clinical Impression ? Pt ambulated 600' in the hallway holding IV pole, no loss of balance. No assistance needed for mobility. He reports he's been walking in the halls TID with nursing. No further PT indicated, will sign off. Encouraged pt to continue walking TID.    ?   ? ?Recommendations for follow up therapy are one component of a multi-disciplinary discharge planning process, led by the attending physician.  Recommendations may be updated based on patient status, additional functional criteria and insurance authorization. ? ?Follow Up Recommendations No PT follow up ? ?  ?Assistance Recommended at Discharge Set up Supervision/Assistance  ?Patient can return home with the following ? Assistance with cooking/housework ? ?  ?Equipment Recommendations    ?Recommendations for Other Services ?    ?  ?Functional Status Assessment Patient has not had a recent decline in their functional status  ? ?  ?Precautions / Restrictions Precautions ?Precautions: None ?Restrictions ?Weight Bearing Restrictions: No  ? ?  ? ?Mobility ? Bed Mobility ?Overal bed mobility: Independent ?  ?  ?  ?  ?  ?  ?  ?  ? ?Transfers ?Overall transfer level: Independent ?  ?  ?  ?  ?  ?  ?  ?  ?  ?  ? ?Ambulation/Gait ?Ambulation/Gait assistance: Modified independent (Device/Increase time) ?Gait Distance (Feet): 600 Feet ?Assistive device: IV Pole ?Gait Pattern/deviations: WFL(Within Functional Limits) ?Gait velocity: WNL ?  ?  ?General Gait Details: steady, no loss of balance ? ?Stairs ?  ?  ?  ?  ?  ? ?Wheelchair Mobility ?  ? ?Modified Rankin (Stroke Patients Only) ?  ? ?  ? ?Balance Overall balance assessment: Independent ?  ?  ?   ?  ?  ?  ?  ?  ?  ?  ?  ?  ?  ?  ?  ?  ?  ?  ?   ? ? ? ?Pertinent Vitals/Pain Pain Assessment ?Pain Assessment: 0-10 ?Pain Score: 6  ?Pain Location: adbomen at incision site ?Pain Descriptors / Indicators: Sore ?Pain Intervention(s): Limited activity within patient's tolerance, Monitored during session, Premedicated before session  ? ? ?Home Living Family/patient expects to be discharged to:: Private residence ?Living Arrangements: Spouse/significant other ?Available Help at Discharge: Family;Available 24 hours/day ?  ?Home Access: Level entry ?  ?  ?  ?Home Layout: One level ?Home Equipment: None ?   ?  ?Prior Function Prior Level of Function : Independent/Modified Independent ?  ?  ?  ?  ?  ?  ?Mobility Comments: independent, no AD, no falls, works as a Banker ?  ?  ? ? ?Hand Dominance  ?   ? ?  ?Extremity/Trunk Assessment  ? Upper Extremity Assessment ?Upper Extremity Assessment: Overall WFL for tasks assessed ?  ? ?Lower Extremity Assessment ?Lower Extremity Assessment: Overall WFL for tasks assessed ?  ? ?Cervical / Trunk Assessment ?Cervical / Trunk Assessment: Normal  ?Communication  ? Communication: No difficulties  ?Cognition Arousal/Alertness: Awake/alert ?Behavior During Therapy: [facility] for tasks assessed/performed ?Overall Cognitive Status: Within Functional Limits for tasks assessed ?  ?  ?  ?  ?  ?  ?  ?  ?  ?  ?  ?  ?  ?  ?  ?  ?  ?  ?  ? ?  ?  General Comments   ? ?  ?Exercises    ? ?Assessment/Plan  ?  ?PT Assessment Patient does not need any further PT services  ?PT Problem List   ? ?   ?  ?PT Treatment Interventions     ? ?PT Goals (Current goals can be found in the Care Plan section)  ?Acute Rehab PT Goals ?Patient Stated Goal: get back to work ?PT Goal Formulation: All assessment and education complete, DC therapy ? ?  ?Frequency   ?  ? ? ?Co-evaluation   ?  ?  ?  ?  ? ? ?  ?AM-PAC PT "6 Clicks" Mobility  ?Outcome Measure Help needed turning from your back to your side while in a  flat bed without using bedrails?: None ?Help needed moving from lying on your back to sitting on the side of a flat bed without using bedrails?: None ?Help needed moving to and from a bed to a chair (including a wheelchair)?: None ?Help needed standing up from a chair using your arms (e.g., wheelchair or bedside chair)?: None ?Help needed to walk in hospital room?: None ?Help needed climbing 3-5 steps with a railing? : None ?6 Click Score: 24 ? ?  ?End of Session   ?Activity Tolerance: Patient tolerated treatment well ?Patient left: in chair;with call bell/phone within reach ?Nurse Communication: Mobility status ?  ?  ? ?Time: 6063-0160 ?PT Time Calculation (min) (ACUTE ONLY): 13 min ? ? ?Charges:   PT Evaluation ?$PT Eval Low Complexity: 1 Low ?  ?  ?   ? ?Blondell Reveal Kistler PT 03/29/2022  ?Acute Rehabilitation Services ?Pager [phone] ?Office [phone] ? ? ?

## 2022-03-29 NOTE — Consult Note (Addendum)
[facility] Nurse ostomy follow up ?Pt had ileal conduit surgery performed 4/5 ?Stoma type/location: Stoma is red and viable, flush with skin level, 1 1/4 inches with 2 stints in place. ?Pt and wife at bedside performed 2 piece pouching application with assistance.  Pt prefers to use 2 piece system after discharge, but needs a convex pouching system, which [employer] formulary only carries in a one piece pouch. Explained this information with the Pt and wife and they verbalize understanding. Will order 2 piece convex poucing system samples be sent to the patient after discharge via Secure Start program from [employer], so they will have the product numbers and ordering information. ?Peristomal assessment: intact ?Output: mod amt cloudy yellow urine in drainage bag.  ?Ostomy pouching: 1pc convex with barrier ring ?Education provided:  Pt was able to apply barrier ring and one piece convex pouch with assistance, using a hand held mirror.  He was able to open and close spout to empty, and attach and disconnect from the bedside drainage bag.  Discussed ordering supplies and pouching routines.   Educational materials left at bedside, which included information on Stealth belt and the outpatient ostomy clinic, and demonstrated use of a belt and left one in the room. 5 sets of supplies ordered to the room: Use Supplies: barrier ring, [place] # [phone], and convex urostomy Roselee Culver # [phone] ?Enrolled patient in [place] Start Discharge program: Yes; see above notes.  ?Pt could benefit from home health assistance after discharge. ?[place] team will continue to follow for further teaching sessions next week. ?Julien Girt MSN, RN, [place], [place], CNS ?[phone]  ?

## 2022-03-30 LAB — TYPE AND SCREEN
ABO/RH(D): A POS
Antibody Screen: NEGATIVE
Unit division: 0
Unit division: 0

## 2022-03-30 LAB — BASIC METABOLIC PANEL
Anion gap: 7 (ref 5–15)
BUN: 12 mg/dL (ref 8–23)
CO2: 25 mmol/L (ref 22–32)
Calcium: 8.3 mg/dL — ABNORMAL LOW (ref 8.9–10.3)
Chloride: 106 mmol/L (ref 98–111)
Creatinine, Ser: 0.89 mg/dL (ref 0.61–1.24)
GFR, Estimated: >60 mL/min (ref >60)
Glucose, Bld: 100 mg/dL — ABNORMAL HIGH (ref 70–99)
Potassium: 3.5 mmol/L (ref 3.5–5.1)
Sodium: 138 mmol/L (ref 135–145)

## 2022-03-30 LAB — BPAM RBC
Blood Product Expiration Date: 202304232359
Blood Product Expiration Date: 202304242359
Unit Type and Rh: 6200
Unit Type and Rh: 6200

## 2022-03-30 LAB — HEMOGLOBIN AND HEMATOCRIT, BLOOD
HCT: 27.3 % — ABNORMAL LOW (ref 39.0–52.0)
Hemoglobin: 9.3 g/dL — ABNORMAL LOW (ref 13.0–17.0)

## 2022-03-30 NOTE — Progress Notes (Signed)
3 Days Post-Op  ? ?Subjective/Chief Complaint: ? ?1 - Bladder Cancer - s/p cysto/ICG + robotic cystoprostatectomy/node disssection + ileal conduit urinary diversion 03/27/22. Path penging. Admitted 4/4 for bowel prep and stomal marking. ? ?2 - Ileus / Return of Bowel Function - pt with planned small bowel anastamosis as part of ileal conduit 03/27/22. Received peri-op entereg. NPO with ice chips initially. Advanced to clears 4/7.  ? ?3 - Disposition / Rehab -independent at [place] and does all ADL's. PT eval pending. ? ?Today "Rod" is continuing to progress. No emesis. Tolerating clears. +Flatus, No BM. +OOB ? ? ?Objective: ?Vital signs in last 24 hours: ?Temp:  [98.5 ?F (36.9 ?C)-99.6 ?F (37.6 ?C)] 98.5 ?F (36.9 ?C) (04/08 0533) ?Pulse Rate:  [75-92] 75 (04/08 0533) ?Resp:  [18-21] 18 (04/08 0533) ?BP: (112-127)/(72-82) 127/82 (04/08 0533) ?SpO2:  [91 %-95 %] 92 % (04/08 0533) ?Last BM Date : 03/26/22 ? ?Intake/Output from previous day: ?04/07 0701 - 04/08 0700 ?In: 1460.7 [P.O.:120; I.V.:1340.7] ?Out: 2680 [Urine:2400; Drains:280] ?Intake/Output this shift: ?No intake/output data recorded. ? ?NAD, AOx3, very pleasant, near baseline. ?Non-labored breathing on RA ?RRR ?RLQ urostomy pink/patent with Rt (red) and Lt (blue) bander stents and copious non-foul light pink urine ?Recent surgical sites c/d/i ?JP with scant serosanguinous output ?Mild penoscrotal edema as expected.  ?No c/c/e ? ?Lab Results:  ?Recent Labs  ?  03/29/22 ?0354 03/30/22 ?0320  ?HGB 9.8* 9.3*  ?HCT 28.8* 27.3*  ? ? ?BMET ?Recent Labs  ?  03/29/22 ?0354 03/30/22 ?0320  ?NA 137 138  ?K 4.0 3.5  ?CL 105 106  ?CO2 24 25  ?GLUCOSE 93 100*  ?BUN 17 12  ?CREATININE 1.02 0.89  ?CALCIUM 8.1* 8.3*  ? ? ?PT/INR ?No results for input(s): LABPROT, INR in the last 72 hours. ?ABG ?No results for input(s): PHART, HCO3 in the last 72 hours. ? ?Invalid input(s): PCO2, PO2 ? ?Studies/Results: ?No results found. ? ?Anti-infectives: ?Anti-infectives (From admission,  onward)  ? ? Start     Dose/Rate Route Frequency Ordered Stop  ? 03/27/22 1700  piperacillin-tazobactam (ZOSYN) IVPB 3.375 g       ? 3.375 g ?12.5 mL/hr over 240 Minutes Intravenous Every 8 hours 03/27/22 1538 03/29/22 1232  ? 03/27/22 0600  piperacillin-tazobactam (ZOSYN) IVPB 3.375 g       ? 3.375 g ?100 mL/hr over 30 Minutes Intravenous 30 min pre-op 03/26/22 1109 03/27/22 0926  ? 03/26/22 1400  metroNIDAZOLE (FLAGYL) tablet 500 mg       ? 500 mg Oral Every 4 hours 03/26/22 1256 03/26/22 2300  ? 03/26/22 1345  neomycin (MYCIFRADIN) tablet 500 mg  Status:  Discontinued       ? 500 mg Oral Every 4 hours 03/26/22 1256 03/26/22 1300  ? ?  ? ? ?Assessment/Plan: ? ?Doing well POD 3. advance to FLD, continue ambulation. Appreciate ostomy team comanagement.  ? ?Caterine Mcmeans L Keenon Leitzel ?03/30/2022 ? ?

## 2022-03-31 LAB — BASIC METABOLIC PANEL
Anion gap: 6 (ref 5–15)
BUN: 13 mg/dL (ref 8–23)
CO2: 27 mmol/L (ref 22–32)
Calcium: 8.5 mg/dL — ABNORMAL LOW (ref 8.9–10.3)
Chloride: 106 mmol/L (ref 98–111)
Creatinine, Ser: 0.91 mg/dL (ref 0.61–1.24)
GFR, Estimated: >60 mL/min (ref >60)
Glucose, Bld: 94 mg/dL (ref 70–99)
Potassium: 3.3 mmol/L — ABNORMAL LOW (ref 3.5–5.1)
Sodium: 139 mmol/L (ref 135–145)

## 2022-03-31 LAB — HEMOGLOBIN AND HEMATOCRIT, BLOOD
HCT: 26.9 % — ABNORMAL LOW (ref 39.0–52.0)
Hemoglobin: 9.6 g/dL — ABNORMAL LOW (ref 13.0–17.0)

## 2022-03-31 MED ORDER — ENOXAPARIN SODIUM 40 MG/0.4ML IJ SOSY
40.0000 mg | PREFILLED_SYRINGE | INTRAMUSCULAR | Status: DC
  Administered 2022-03-31 – 2022-04-01 (×2): 40 mg via SUBCUTANEOUS
  Filled 2022-03-31 (×2): qty 0.4

## 2022-03-31 MED ORDER — ENOXAPARIN SODIUM 40 MG/0.4ML IJ SOSY: 40.0000 mg | PREFILLED_SYRINGE | INTRAMUSCULAR | Status: DC

## 2022-03-31 NOTE — Plan of Care (Signed)
?  Problem: Clinical Measurements: ?Goal: Ability to maintain clinical measurements within normal limits will improve ?Outcome: Progressing ?  ?Problem: Education: ?Goal: Knowledge of General Education information will improve ?Description: Including pain rating scale, medication(s)/side effects and non-pharmacologic comfort measures ?Outcome: Progressing ?  ?Problem: Health Behavior/Discharge Planning: ?Goal: Ability to manage health-related needs will improve ?Outcome: Progressing ?  ?Problem: Activity: ?Goal: Risk for activity intolerance will decrease ?Outcome: Progressing ?  ?Problem: Nutrition: ?Goal: Adequate nutrition will be maintained ?Outcome: Progressing ?  ?

## 2022-03-31 NOTE — Progress Notes (Signed)
Urology Inpatient Progress Report ? ?Bladder cancer ([place]) [C67.9] ? ?Procedure(s): ?XI ROBOTIC ASSISTED LAPAROSCOPIC COMPLETE CYSTECT ILEAL CONDUIT ?PELVIC LYMPH NODE DISSECTION ?XI ROBOTIC ASSISTED LAPAROSCOPIC RADICAL PROSTATECTOMY ?CYSTOSCOPY WITH INJECTION OF INDOCYANINE GREEN DYE ? ?4 Days Post-Op ? ? ?Intv/Subj: ?No acute events overnight. ?Patient is without complaint. ?Patient ambulating well.  PT signed off.  Tolerating full liquids.  No bowel movement yet. ? ?Principal Problem: ?  Bladder cancer ([place]) ? ?Current Facility-Administered Medications  ?Medication Dose Route Frequency Provider Last Rate Last Admin  ? 0.9 %  sodium chloride infusion   Intravenous Continuous Alexis Frock, MD 50 mL/hr at 03/30/22 1308 New Bag at 03/30/22 1308  ? acetaminophen (TYLENOL) tablet 1,000 mg  1,000 mg Oral Q8H Alexis Frock, MD   1,000 mg at 03/31/22 0615  ? alvimopan (ENTEREG) capsule 12 mg  12 mg Oral BID Debbrah Alar, PA-C   12 mg at 03/31/22 0855  ? Chlorhexidine Gluconate Cloth 2 % PADS 6 each  6 each Topical Daily Alexis Frock, MD   6 each at 03/30/22 0913  ? diphenhydrAMINE (BENADRYL) injection 12.5-25 mg  12.5-25 mg Intravenous Q6H PRN Debbrah Alar, PA-C      ? Or  ? diphenhydrAMINE (BENADRYL) 12.5 MG/5ML elixir 12.5-25 mg  12.5-25 mg Oral Q6H PRN Debbrah Alar, PA-C      ? HYDROmorphone (DILAUDID) injection 0.5-1 mg  0.5-1 mg Intravenous Q2H PRN Debbrah Alar, PA-C   1 mg at 03/29/22 1201  ? naphazoline-glycerin (CLEAR EYES REDNESS) ophth solution 1-2 drop  1-2 drop Both Eyes QID PRN Reola Mosher, MD   1 drop at 03/27/22 1948  ? ondansetron (ZOFRAN) injection 4 mg  4 mg Intravenous Q4H PRN Debbrah Alar, PA-C      ? oxyCODONE (Oxy IR/ROXICODONE) immediate release tablet 5 mg  5 mg Oral Q4H PRN Debbrah Alar, PA-C   5 mg at 03/31/22 4967  ? pantoprazole (PROTONIX) EC tablet 40 mg  40 mg Oral Daily Debbrah Alar, PA-C   40 mg at 03/31/22 0855  ? senna-docusate (Senokot-S) tablet 2 tablet  2 tablet Oral  QHS Debbrah Alar, PA-C   2 tablet at 03/30/22 2154  ? simvastatin (ZOCOR) tablet 20 mg  20 mg Oral QHS Debbrah Alar, PA-C   20 mg at 03/30/22 2155  ? sodium chloride flush (NS) 0.9 % injection 10-40 mL  10-40 mL Intracatheter Q12H Dancy, Amanda, PA-C   10 mL at 03/27/22 2230  ? sodium chloride flush (NS) 0.9 % injection 10-40 mL  10-40 mL Intracatheter PRN Debbrah Alar, PA-C      ? ?Facility-Administered Medications Ordered in Other Encounters  ?Medication Dose Route Frequency Provider Last Rate Last Admin  ? gemcitabine (GEMZAR) chemo syringe for bladder instillation 2,000 mg  2,000 mg Bladder Instillation Once Robley Fries, MD      ? ? ? ?Objective: ?Vital: ?Vitals:  ? 03/30/22 0533 03/30/22 1448 03/30/22 2027 03/31/22 0431  ?BP: 127/82 130/80 129/83 134/83  ?Pulse: 75 80 78 76  ?Resp: '18 18 19 18  '$ ?Temp: 98.5 ?F (36.9 ?C) 98.9 ?F (37.2 ?C) 98.4 ?F (36.9 ?C) 98.2 ?F (36.8 ?C)  ?TempSrc: Oral Oral Oral Oral  ?SpO2: 92% 94% 95% 93%  ?Weight:      ?Height:      ? ?I/Os: ?I/O last 3 completed shifts: ?In: 2114.8 [P.O.:1080; I.V.:1034.8] ?Out: 5916 [Urine:2875; Drains:355] ? ?Physical Exam:  ?General: Patient is in no apparent distress ?Lungs: Normal respiratory effort, chest expands symmetrically. ?GI: Incisions are c/d/i. The  abdomen is soft and nontender without mass.  Ileal conduit pink, patent, productive of clear yellow urine.  Viable.  Bander stents in place. ?JP drain with serosanguinous drainage ?Ext: lower extremities symmetric ? ?Lab Results: ?Recent Labs  ?  03/29/22 ?0354 03/30/22 ?0320 03/31/22 ?0316  ?HGB 9.8* 9.3* 9.6*  ?HCT 28.8* 27.3* 26.9*  ? ?Recent Labs  ?  03/29/22 ?0354 03/30/22 ?0320 03/31/22 ?0316  ?NA 137 138 139  ?K 4.0 3.5 3.3*  ?CL 105 106 106  ?CO2 '24 25 27  '$ ?GLUCOSE 93 100* 94  ?BUN '17 12 13  '$ ?CREATININE 1.02 0.89 0.91  ?CALCIUM 8.1* 8.3* 8.5*  ? ?No results for input(s): LABPT, INR in the last 72 hours. ?No results for input(s): LABURIN in the last 72 hours. ?Results for orders  placed or performed during the hospital encounter of 03/26/22  ?Surgical pcr screen     Status: None  ? Collection Time: 03/27/22  2:34 AM  ? Specimen: Nasal Mucosa; Nasal Swab  ?Result Value Ref Range Status  ? MRSA, PCR NEGATIVE NEGATIVE Final  ? Staphylococcus aureus NEGATIVE NEGATIVE Final  ?  Comment: (NOTE) ?The Xpert SA Assay (FDA approved for NASAL specimens in patients 6 ?years of age and older), is one component of a comprehensive ?surveillance program. It is not intended to diagnose infection nor to ?guide or monitor treatment. ?Performed at [facility] [address] ?[place], [place] [address] ?  ? ? ?Studies/Results: ?No results found. ? ?Assessment: ?Bladder cancer ?Procedure(s): ?XI ROBOTIC ASSISTED LAPAROSCOPIC COMPLETE CYSTECT ILEAL CONDUIT ?PELVIC LYMPH NODE DISSECTION ?XI ROBOTIC ASSISTED LAPAROSCOPIC RADICAL PROSTATECTOMY ?CYSTOSCOPY WITH INJECTION OF INDOCYANINE GREEN DYE, 4 Days Post-Op  doing well. ? ?Plan: ?Discontinue IV fluids.  Start Lovenox DVT prophylaxis.  Continue full liquids until bowel movement.  Continue ambulation.  Continue JP for now. ? ? ?Link Snuffer, MD ?Urology ?03/31/2022, 12:32 PM ? ?

## 2022-04-01 LAB — BASIC METABOLIC PANEL WITH GFR
Anion gap: 6 (ref 5–15)
BUN: 16 mg/dL (ref 8–23)
CO2: 27 mmol/L (ref 22–32)
Calcium: 8.7 mg/dL — ABNORMAL LOW (ref 8.9–10.3)
Chloride: 107 mmol/L (ref 98–111)
Creatinine, Ser: 0.94 mg/dL (ref 0.61–1.24)
GFR, Estimated: >60 mL/min
Glucose, Bld: 100 mg/dL — ABNORMAL HIGH (ref 70–99)
Potassium: 3.5 mmol/L (ref 3.5–5.1)
Sodium: 140 mmol/L (ref 135–145)

## 2022-04-01 LAB — CREATININE, FLUID (PLEURAL, PERITONEAL, JP DRAINAGE): Creat, Fluid: 0.4 mg/dL

## 2022-04-01 LAB — HEMOGLOBIN AND HEMATOCRIT, BLOOD
HCT: 28.8 % — ABNORMAL LOW (ref 39.0–52.0)
Hemoglobin: 10 g/dL — ABNORMAL LOW (ref 13.0–17.0)

## 2022-04-01 LAB — SURGICAL PATHOLOGY

## 2022-04-01 NOTE — Progress Notes (Signed)
5 Days Post-Op  ? ?Subjective/Chief Complaint: ? ? ?1 - Bladder Cancer - s/p cysto/ICG + robotic cystoprostatectomy/node disssection + ileal conduit urinary diversion 03/27/22. Path penging. Admitted 4/4 for bowel prep and stomal marking. JP Cr same as serum 4/10.  ? ?2 - Ileus / Return of Bowel Function - pt with planned small bowel anastamosis as part of ileal conduit 03/27/22. Received peri-op entereg. NPO with ice chips initially. Advanced to clears 4/7. Fulls 4/8. Resumed bowel function 4/9 and advanced to reg diet 4/10.  ? ?3 - Disposition / Rehab -independent at [place] and does all ADL's. PT eval without DME or rehab needs. Will need HHRN for new urostomy teaching / supplies.  ? ?Today "Jerome Cruz" is doing exceptionally well. Ambulating, pain controlled on mostly tylenol. BM x several last 24 hours.  ? ?Objective: ?Vital signs in last 24 hours: ?Temp:  [97.9 ?F (36.6 ?C)-99.8 ?F (37.7 ?C)] 97.9 ?F (36.6 ?C) (04/10 1142) ?Pulse Rate:  [74-84] 84 (04/10 1142) ?Resp:  [16-18] 18 (04/10 1142) ?BP: (126-131)/(74-86) 131/74 (04/10 1142) ?SpO2:  [95 %-97 %] 97 % (04/10 1142) ?Last BM Date : 03/26/22 ? ?Intake/Output from previous day: ?04/09 0701 - 04/10 0700 ?In: 320 [P.O.:320] ?Out: 1660 [Urine:1450; Drains:210] ?Intake/Output this shift: ?Total I/O ?In: 240 [P.O.:240] ?Out: 760 [Urine:700; Drains:60] ? ? ? ?NAD, AOx3, very pleasant, near baseline. Sister at bedside.  ?Non-labored breathing on RA ?RRR ?RLQ urostomy pink/patent with Rt (red) and Lt (blue) bander stents and copious non-foul light pink urine ?Recent surgical sites c/d/1 ?JP with scant serosanguinous output that is non-foul ?Mild penoscrotal edema as expected (improved).  ?No c/c/e ? ?Lab Results:  ?Recent Labs  ?  03/31/22 ?0316 04/01/22 ?0345  ?HGB 9.6* 10.0*  ?HCT 26.9* 28.8*  ? ?BMET ?Recent Labs  ?  03/31/22 ?0316 04/01/22 ?0345  ?NA 139 140  ?K 3.3* 3.5  ?CL 106 107  ?CO2 27 27  ?GLUCOSE 94 100*  ?BUN 13 16  ?CREATININE 0.91 0.94  ?CALCIUM 8.5* 8.7*   ? ?PT/INR ?No results for input(s): LABPROT, INR in the last 72 hours. ?ABG ?No results for input(s): PHART, HCO3 in the last 72 hours. ? ?Invalid input(s): PCO2, PO2 ? ?Studies/Results: ?No results found. ? ?Anti-infectives: ?Anti-infectives (From admission, onward)  ? ? Start     Dose/Rate Route Frequency Ordered Stop  ? 03/27/22 1700  piperacillin-tazobactam (ZOSYN) IVPB 3.375 g       ? 3.375 g ?12.5 mL/hr over 240 Minutes Intravenous Every 8 hours 03/27/22 1538 03/29/22 1232  ? 03/27/22 0600  piperacillin-tazobactam (ZOSYN) IVPB 3.375 g       ? 3.375 g ?100 mL/hr over 30 Minutes Intravenous 30 min pre-op 03/26/22 1109 03/27/22 0926  ? 03/26/22 1400  metroNIDAZOLE (FLAGYL) tablet 500 mg       ? 500 mg Oral Every 4 hours 03/26/22 1256 03/26/22 2300  ? 03/26/22 1345  neomycin (MYCIFRADIN) tablet 500 mg  Status:  Discontinued       ? 500 mg Oral Every 4 hours 03/26/22 1256 03/26/22 1300  ? ?  ? ? ?Assessment/Plan: ? ?Doing very well POD 5 s/p major extirpative surgery for bladder cancer. Reg diet. CM to arrange [facility]. Likely pull; JP and DC tomorrow.  ? ? ?Alexis Frock ?04/01/2022 ? ?

## 2022-04-01 NOTE — Consult Note (Signed)
[facility] Nurse ostomy follow up ?Patient receiving care in [place]. No family present at time of visit. ?Stoma type/location: RUQ urostomy ?Stomal assessment/size: 1 1/4 inches, round, slightly budded, sutures intact. Stents in place, both draining urine ?Peristomal assessment: intact ?Treatment options for stomal/peristomal skin: barrier ring ?Output: slightly pink urine ?Ostomy pouching: 1pc convex ?Education provided:  ?With my guidance, patient performed the entire pouch removal, cleaning, new pouch preparation and application process.  He required verbal cues and very minimal physical intervention in completing this process.  He asked many thoughtful and appropriate questions.   ?Enrolled patient in [place] Discharge program: Yes, previously. ? ?Additional supplies in room. ?Val Riles, RN, MSN, CWOCN, CNS-BC, pager [phone]  ?

## 2022-04-02 MED ORDER — HEPARIN SOD (PORK) LOCK FLUSH 100 UNIT/ML IV SOLN
500.0000 [IU] | INTRAVENOUS | Status: AC | PRN
  Administered 2022-04-02: 500 [IU]
  Filled 2022-04-02: qty 5

## 2022-04-02 MED ORDER — SENNOSIDES-DOCUSATE SODIUM 8.6-50 MG PO TABS: 1.0000 | ORAL_TABLET | Freq: Two times a day (BID) | ORAL | 0 refills | Status: AC

## 2022-04-02 MED ORDER — OXYCODONE-ACETAMINOPHEN 5-325 MG PO TABS: 1.0000 | ORAL_TABLET | Freq: Four times a day (QID) | ORAL | 0 refills | Status: DC | PRN

## 2022-04-02 NOTE — TOC Transition Note (Signed)
Transition of Care (TOC) - CM/SW Discharge Note ? ? ?Patient Details  ?Name: [name] ?MRN: [id number] ?Date of Birth: 05/23/1956 ? ?Transition of Care [facility]) CM/SW Contact:  ?Dessa Phi, RN ?Phone Number: ?04/02/2022, 10:23 AM ? ? ?Clinical Narrative:[facility] already aware of d/c. No further CM needs.   ? ? ? ?Final next level of care: [place] ?Barriers to Discharge: No Barriers Identified ? ? ?Patient Goals and CMS Choice ?Patient states their goals for this hospitalization and ongoing recovery are:: Home ?CMS Medicare.gov Compare Post Acute Care list provided to:: Patient Represenative (must comment) ?Choice offered to / list presented to : Spouse ? ?Discharge Placement ?  ?           ?  ?  ?  ?  ? ?Discharge Plan and Services ?  ?Discharge Planning Services: CM Consult ?Post Acute Care Choice: Home Health          ?  ?  ?  ?  ?  ?  ?  ?  ?  ?  ? ?Social Determinants of Health (SDOH) Interventions ?  ? ? ?Readmission Risk Interventions ?   ? View : No data to display.  ?  ?  ?  ? ? ? ? ? ?

## 2022-04-02 NOTE — Progress Notes (Signed)
Pt received discharge teaching.  Provided supplies for urostomy bag.  Pt belongings returned.  Pt walked to front entrance with nurse for transport with wife.   ?

## 2022-04-02 NOTE — Plan of Care (Signed)
?  Problem: Clinical Measurements: ?Goal: Postoperative complications will be avoided or minimized ?Outcome: Progressing ?  ?Problem: Skin Integrity: ?Goal: Demonstration of wound healing without infection will improve ?Outcome: Progressing ?  ?Problem: Clinical Measurements: ?Goal: Will remain free from infection ?Outcome: Progressing ?Goal: Diagnostic test results will improve ?Outcome: Progressing ?  ?

## 2022-04-02 NOTE — Discharge Summary (Signed)
Physician Discharge Summary  ?Patient ID: ?Jerome Cruz ?MRN: [id number] ?DOB/AGE: 64/14/1957 64 y.o. ? ?Admit date: 03/26/2022 ?Discharge date: 04/02/2022 ? ?Admission Diagnoses: Bladder Cancer ? ?Discharge Diagnoses:  ?Principal Problem: ?  Bladder cancer ([place]) ? ? ?Discharged Condition: good ? ?Hospital Course:  ? ?1 - Bladder Cancer - s/p cysto/ICG + robotic cystoprostatectomy/node disssection + ileal conduit urinary diversion 03/27/22. Path pT3N0Mx with negative margins. Admitted 4/4 for bowel prep and stomal marking. JP Cr same as serum 4/10 and removed 4/11.  ? ?2 - Ileus / Return of Bowel Function - pt with planned small bowel anastamosis as part of ileal conduit 03/27/22. Received peri-op entereg. NPO with ice chips initially. Advanced to clears 4/7. Fulls 4/8. Resumed bowel function 4/9 and advanced to reg diet 4/10.  ? ?3 - Disposition / Rehab -independent at [facility] and does all ADL's. PT eval without DME or rehab needs. Will need HHRN for new urostomy teaching / supplies.  ? ?Consults:  PT, Case Management, Ostomy RN ? ?Significant Diagnostic Studies: labs: as per above ? ?Treatments: surgery: as per above ? ?Discharge Exam: ?Blood pressure 128/73, pulse 81, temperature 98.4 ?F (36.9 ?C), temperature source Oral, resp. rate 17, height 5' 7"  (1.702 m), weight 72.6 kg, SpO2 94 %. ? ?NAD, AOx3, very pleasant, near baseline. Sister at bedside.  ?Non-labored breathing on RA ?RRR ?RLQ urostomy pink/patent with Rt (red) and Lt (blue) bander stents and copious non-foul light pink urine ?Recent surgical sites c/d/1 ?JP with scant serosanguinous output that is non-foul. Removed and dry dressing placed.  ?Mild penoscrotal edema as expected (improved).  ?No c/c/e ? ?Disposition: HOME ? ? ?Allergies as of 04/02/2022   ? ?   Reactions  ? Atorvastatin Other (See Comments)  ? Pt unsure of reaction   ? Irbesartan Other (See Comments)  ? Pt unsure of reaction   ? ?  ? ?  ?Medication List  ?  ? ?STOP taking these medications    ? ?oxybutynin 5 MG tablet ?Commonly known as: DITROPAN ?  ?traMADol 50 MG tablet ?Commonly known as: Ultram ?  ? ?  ? ?TAKE these medications   ? ?cholecalciferol 25 MCG (1000 UNIT) tablet ?Commonly known as: VITAMIN D3 ?Take 1,000 Units by mouth daily. ?  ?esomeprazole 20 MG capsule ?Commonly known as: [place] ?Take 20 mg by mouth daily. ?  ?lidocaine-prilocaine cream ?Commonly known as: EMLA ?Apply 1 application topically as needed. ?  ?oxyCODONE-acetaminophen 5-325 MG tablet ?Commonly known as: Percocet ?Take 1 tablet by mouth every 6 (six) hours as needed for severe pain (post-operatively). ?  ?prochlorperazine 10 MG tablet ?Commonly known as: COMPAZINE ?Take 1 tablet (10 mg total) by mouth every 6 (six) hours as needed for nausea or vomiting. ?  ?senna-docusate 8.6-50 MG tablet ?Commonly known as: Senokot-S ?Take 1 tablet by mouth 2 (two) times daily. While taking strong pain meds to prevent constipation ?  ?Shingrix injection ?Generic drug: Zoster Vaccine Adjuvanted ?Shingrix (PF) 50 mcg/0.5 mL intramuscular suspension, kit ? ADM 0.5ML IM UTD ?  ?simvastatin 20 MG tablet ?Commonly known as: ZOCOR ?Take 20 mg by mouth at bedtime. ?  ?valsartan 80 MG tablet ?Commonly known as: DIOVAN ?Take 80 mg by mouth every evening. ?  ?vitamin B-12 1000 MCG tablet ?Commonly known as: CYANOCOBALAMIN ?Take 1,000 mcg by mouth daily. ?  ? ?  ? ? Follow-up Information   ? ? Alexis Frock, MD Follow up on 04/22/2022.   ?Specialty: Urology ?Why: at 9:45 for MD visit. ?  Contact information: ?[address] ?[place] [address] ?[phone] ? ? ?  ?  ? ?  ?  ? ?  ? ? ?Signed: ?Alexis Frock ?04/02/2022, 7:52 AM ? ? ?

## 2022-04-03 DIAGNOSIS — Z79899 Other long term (current) drug therapy: Secondary | ICD-10-CM | POA: Diagnosis not present

## 2022-04-03 DIAGNOSIS — I10 Essential (primary) hypertension: Secondary | ICD-10-CM | POA: Diagnosis not present

## 2022-04-03 DIAGNOSIS — Z79891 Long term (current) use of opiate analgesic: Secondary | ICD-10-CM | POA: Diagnosis not present

## 2022-04-03 DIAGNOSIS — E785 Hyperlipidemia, unspecified: Secondary | ICD-10-CM | POA: Diagnosis not present

## 2022-04-03 DIAGNOSIS — C679 Malignant neoplasm of bladder, unspecified: Secondary | ICD-10-CM | POA: Diagnosis not present

## 2022-04-03 DIAGNOSIS — K219 Gastro-esophageal reflux disease without esophagitis: Secondary | ICD-10-CM | POA: Diagnosis not present

## 2022-04-03 DIAGNOSIS — Z87891 Personal history of nicotine dependence: Secondary | ICD-10-CM | POA: Diagnosis not present

## 2022-04-03 DIAGNOSIS — Z436 Encounter for attention to other artificial openings of urinary tract: Secondary | ICD-10-CM | POA: Diagnosis not present

## 2022-04-08 DIAGNOSIS — Z79899 Other long term (current) drug therapy: Secondary | ICD-10-CM | POA: Diagnosis not present

## 2022-04-08 DIAGNOSIS — Z87891 Personal history of nicotine dependence: Secondary | ICD-10-CM | POA: Diagnosis not present

## 2022-04-08 DIAGNOSIS — Z436 Encounter for attention to other artificial openings of urinary tract: Secondary | ICD-10-CM | POA: Diagnosis not present

## 2022-04-08 DIAGNOSIS — K219 Gastro-esophageal reflux disease without esophagitis: Secondary | ICD-10-CM | POA: Diagnosis not present

## 2022-04-08 DIAGNOSIS — C679 Malignant neoplasm of bladder, unspecified: Secondary | ICD-10-CM | POA: Diagnosis not present

## 2022-04-08 DIAGNOSIS — I10 Essential (primary) hypertension: Secondary | ICD-10-CM | POA: Diagnosis not present

## 2022-04-08 DIAGNOSIS — E785 Hyperlipidemia, unspecified: Secondary | ICD-10-CM | POA: Diagnosis not present

## 2022-04-08 DIAGNOSIS — Z79891 Long term (current) use of opiate analgesic: Secondary | ICD-10-CM | POA: Diagnosis not present

## 2022-04-10 DIAGNOSIS — Z87891 Personal history of nicotine dependence: Secondary | ICD-10-CM | POA: Diagnosis not present

## 2022-04-10 DIAGNOSIS — E785 Hyperlipidemia, unspecified: Secondary | ICD-10-CM | POA: Diagnosis not present

## 2022-04-10 DIAGNOSIS — K219 Gastro-esophageal reflux disease without esophagitis: Secondary | ICD-10-CM | POA: Diagnosis not present

## 2022-04-10 DIAGNOSIS — Z79899 Other long term (current) drug therapy: Secondary | ICD-10-CM | POA: Diagnosis not present

## 2022-04-10 DIAGNOSIS — Z436 Encounter for attention to other artificial openings of urinary tract: Secondary | ICD-10-CM | POA: Diagnosis not present

## 2022-04-10 DIAGNOSIS — I10 Essential (primary) hypertension: Secondary | ICD-10-CM | POA: Diagnosis not present

## 2022-04-10 DIAGNOSIS — Z79891 Long term (current) use of opiate analgesic: Secondary | ICD-10-CM | POA: Diagnosis not present

## 2022-04-10 DIAGNOSIS — C679 Malignant neoplasm of bladder, unspecified: Secondary | ICD-10-CM | POA: Diagnosis not present

## 2022-04-18 DIAGNOSIS — I10 Essential (primary) hypertension: Secondary | ICD-10-CM | POA: Diagnosis not present

## 2022-04-18 DIAGNOSIS — Z79891 Long term (current) use of opiate analgesic: Secondary | ICD-10-CM | POA: Diagnosis not present

## 2022-04-18 DIAGNOSIS — E785 Hyperlipidemia, unspecified: Secondary | ICD-10-CM | POA: Diagnosis not present

## 2022-04-18 DIAGNOSIS — K219 Gastro-esophageal reflux disease without esophagitis: Secondary | ICD-10-CM | POA: Diagnosis not present

## 2022-04-18 DIAGNOSIS — Z436 Encounter for attention to other artificial openings of urinary tract: Secondary | ICD-10-CM | POA: Diagnosis not present

## 2022-04-18 DIAGNOSIS — C679 Malignant neoplasm of bladder, unspecified: Secondary | ICD-10-CM | POA: Diagnosis not present

## 2022-04-18 DIAGNOSIS — Z87891 Personal history of nicotine dependence: Secondary | ICD-10-CM | POA: Diagnosis not present

## 2022-04-18 DIAGNOSIS — Z936 Other artificial openings of urinary tract status: Secondary | ICD-10-CM | POA: Diagnosis not present

## 2022-04-18 DIAGNOSIS — Z79899 Other long term (current) drug therapy: Secondary | ICD-10-CM | POA: Diagnosis not present

## 2022-04-18 DIAGNOSIS — Z8551 Personal history of malignant neoplasm of bladder: Secondary | ICD-10-CM | POA: Diagnosis not present

## 2022-04-22 DIAGNOSIS — B961 Klebsiella pneumoniae [K. pneumoniae] as the cause of diseases classified elsewhere: Secondary | ICD-10-CM | POA: Diagnosis not present

## 2022-04-22 DIAGNOSIS — N390 Urinary tract infection, site not specified: Secondary | ICD-10-CM | POA: Diagnosis not present

## 2022-04-26 DIAGNOSIS — Z87891 Personal history of nicotine dependence: Secondary | ICD-10-CM | POA: Diagnosis not present

## 2022-04-26 DIAGNOSIS — K219 Gastro-esophageal reflux disease without esophagitis: Secondary | ICD-10-CM | POA: Diagnosis not present

## 2022-04-26 DIAGNOSIS — E785 Hyperlipidemia, unspecified: Secondary | ICD-10-CM | POA: Diagnosis not present

## 2022-04-26 DIAGNOSIS — I10 Essential (primary) hypertension: Secondary | ICD-10-CM | POA: Diagnosis not present

## 2022-04-26 DIAGNOSIS — Z79891 Long term (current) use of opiate analgesic: Secondary | ICD-10-CM | POA: Diagnosis not present

## 2022-04-26 DIAGNOSIS — C679 Malignant neoplasm of bladder, unspecified: Secondary | ICD-10-CM | POA: Diagnosis not present

## 2022-04-26 DIAGNOSIS — Z79899 Other long term (current) drug therapy: Secondary | ICD-10-CM | POA: Diagnosis not present

## 2022-04-26 DIAGNOSIS — Z436 Encounter for attention to other artificial openings of urinary tract: Secondary | ICD-10-CM | POA: Diagnosis not present

## 2022-04-30 DIAGNOSIS — Z436 Encounter for attention to other artificial openings of urinary tract: Secondary | ICD-10-CM | POA: Diagnosis not present

## 2022-04-30 DIAGNOSIS — Z87891 Personal history of nicotine dependence: Secondary | ICD-10-CM | POA: Diagnosis not present

## 2022-04-30 DIAGNOSIS — Z79891 Long term (current) use of opiate analgesic: Secondary | ICD-10-CM | POA: Diagnosis not present

## 2022-04-30 DIAGNOSIS — Z79899 Other long term (current) drug therapy: Secondary | ICD-10-CM | POA: Diagnosis not present

## 2022-04-30 DIAGNOSIS — C679 Malignant neoplasm of bladder, unspecified: Secondary | ICD-10-CM | POA: Diagnosis not present

## 2022-04-30 DIAGNOSIS — K219 Gastro-esophageal reflux disease without esophagitis: Secondary | ICD-10-CM | POA: Diagnosis not present

## 2022-04-30 DIAGNOSIS — E785 Hyperlipidemia, unspecified: Secondary | ICD-10-CM | POA: Diagnosis not present

## 2022-04-30 DIAGNOSIS — I10 Essential (primary) hypertension: Secondary | ICD-10-CM | POA: Diagnosis not present

## 2022-05-06 DIAGNOSIS — R8271 Bacteriuria: Secondary | ICD-10-CM | POA: Diagnosis not present

## 2022-05-07 DIAGNOSIS — Z87891 Personal history of nicotine dependence: Secondary | ICD-10-CM | POA: Diagnosis not present

## 2022-05-07 DIAGNOSIS — K219 Gastro-esophageal reflux disease without esophagitis: Secondary | ICD-10-CM | POA: Diagnosis not present

## 2022-05-07 DIAGNOSIS — Z79899 Other long term (current) drug therapy: Secondary | ICD-10-CM | POA: Diagnosis not present

## 2022-05-07 DIAGNOSIS — Z436 Encounter for attention to other artificial openings of urinary tract: Secondary | ICD-10-CM | POA: Diagnosis not present

## 2022-05-07 DIAGNOSIS — E785 Hyperlipidemia, unspecified: Secondary | ICD-10-CM | POA: Diagnosis not present

## 2022-05-07 DIAGNOSIS — Z936 Other artificial openings of urinary tract status: Secondary | ICD-10-CM | POA: Diagnosis not present

## 2022-05-07 DIAGNOSIS — Z8551 Personal history of malignant neoplasm of bladder: Secondary | ICD-10-CM | POA: Diagnosis not present

## 2022-05-07 DIAGNOSIS — Z79891 Long term (current) use of opiate analgesic: Secondary | ICD-10-CM | POA: Diagnosis not present

## 2022-05-07 DIAGNOSIS — C679 Malignant neoplasm of bladder, unspecified: Secondary | ICD-10-CM | POA: Diagnosis not present

## 2022-05-07 DIAGNOSIS — I10 Essential (primary) hypertension: Secondary | ICD-10-CM | POA: Diagnosis not present

## 2022-05-14 DIAGNOSIS — E785 Hyperlipidemia, unspecified: Secondary | ICD-10-CM | POA: Diagnosis not present

## 2022-05-14 DIAGNOSIS — Z125 Encounter for screening for malignant neoplasm of prostate: Secondary | ICD-10-CM | POA: Diagnosis not present

## 2022-05-14 DIAGNOSIS — R7301 Impaired fasting glucose: Secondary | ICD-10-CM | POA: Diagnosis not present

## 2022-05-21 DIAGNOSIS — I10 Essential (primary) hypertension: Secondary | ICD-10-CM | POA: Diagnosis not present

## 2022-05-21 DIAGNOSIS — R82998 Other abnormal findings in urine: Secondary | ICD-10-CM | POA: Diagnosis not present

## 2022-05-21 DIAGNOSIS — E785 Hyperlipidemia, unspecified: Secondary | ICD-10-CM | POA: Diagnosis not present

## 2022-05-21 DIAGNOSIS — R8271 Bacteriuria: Secondary | ICD-10-CM | POA: Diagnosis not present

## 2022-05-21 DIAGNOSIS — Z0000 Encounter for general adult medical examination without abnormal findings: Secondary | ICD-10-CM | POA: Diagnosis not present

## 2022-05-21 DIAGNOSIS — Z23 Encounter for immunization: Secondary | ICD-10-CM | POA: Diagnosis not present

## 2022-05-22 ENCOUNTER — Other Ambulatory Visit: Payer: Self-pay | Admitting: Internal Medicine

## 2022-05-22 DIAGNOSIS — M5136 Other intervertebral disc degeneration, lumbar region: Secondary | ICD-10-CM

## 2022-05-23 ENCOUNTER — Telehealth: Payer: Self-pay | Admitting: Oncology

## 2022-05-23 NOTE — Telephone Encounter (Signed)
Called patient regarding upcoming appointments, patient is notified. 

## 2022-05-28 ENCOUNTER — Ambulatory Visit
Admission: RE | Admit: 2022-05-28 | Discharge: 2022-05-28 | Disposition: A | Discharge status: Home / Self Care | Payer: BC Managed Care – PPO | Source: Ambulatory Visit | Attending: Internal Medicine | Admitting: Internal Medicine

## 2022-05-28 DIAGNOSIS — M545 Low back pain, unspecified: Secondary | ICD-10-CM | POA: Diagnosis not present

## 2022-05-28 DIAGNOSIS — M48061 Spinal stenosis, lumbar region without neurogenic claudication: Secondary | ICD-10-CM | POA: Diagnosis not present

## 2022-05-28 DIAGNOSIS — M5136 Other intervertebral disc degeneration, lumbar region: Secondary | ICD-10-CM

## 2022-05-29 ENCOUNTER — Other Ambulatory Visit: Payer: Self-pay | Admitting: Nurse Practitioner

## 2022-05-30 ENCOUNTER — Inpatient Hospital Stay (HOSPITAL_BASED_OUTPATIENT_CLINIC_OR_DEPARTMENT_OTHER): Payer: BC Managed Care – PPO | Admitting: Oncology

## 2022-05-30 ENCOUNTER — Other Ambulatory Visit: Payer: Self-pay

## 2022-05-30 ENCOUNTER — Inpatient Hospital Stay: Payer: BC Managed Care – PPO | Attending: Oncology

## 2022-05-30 VITALS — BP 157/110 | HR 73 | Temp 97.5°F | Resp 18 | Ht 67.0 in | Wt 157.0 lb

## 2022-05-30 DIAGNOSIS — C679 Malignant neoplasm of bladder, unspecified: Secondary | ICD-10-CM

## 2022-05-30 DIAGNOSIS — Z95828 Presence of other vascular implants and grafts: Secondary | ICD-10-CM

## 2022-05-30 DIAGNOSIS — Z79899 Other long term (current) drug therapy: Secondary | ICD-10-CM | POA: Insufficient documentation

## 2022-05-30 DIAGNOSIS — D6481 Anemia due to antineoplastic chemotherapy: Secondary | ICD-10-CM | POA: Diagnosis not present

## 2022-05-30 LAB — CBC WITH DIFFERENTIAL (CANCER CENTER ONLY)
Abs Immature Granulocytes: 0.04 10*3/uL (ref 0.00–0.07)
Basophils Absolute: 0 10*3/uL (ref 0.0–0.1)
Basophils Relative: 1 %
Eosinophils Absolute: 0.2 10*3/uL (ref 0.0–0.5)
Eosinophils Relative: 2 %
HCT: 29.9 % — ABNORMAL LOW (ref 39.0–52.0)
Hemoglobin: 10.2 g/dL — ABNORMAL LOW (ref 13.0–17.0)
Immature Granulocytes: 1 %
Lymphocytes Relative: 32 %
Lymphs Abs: 2.6 10*3/uL (ref 0.7–4.0)
MCH: 31.9 pg (ref 26.0–34.0)
MCHC: 34.1 g/dL (ref 30.0–36.0)
MCV: 93.4 fL (ref 80.0–100.0)
Monocytes Absolute: 0.5 10*3/uL (ref 0.1–1.0)
Monocytes Relative: 7 %
Neutro Abs: 4.7 10*3/uL (ref 1.7–7.7)
Neutrophils Relative %: 57 %
Platelet Count: 205 10*3/uL (ref 150–400)
RBC: 3.2 MIL/uL — ABNORMAL LOW (ref 4.22–5.81)
RDW: 14.2 % (ref 11.5–15.5)
WBC Count: 8.1 10*3/uL (ref 4.0–10.5)
nRBC: 0 % (ref 0.0–0.2)

## 2022-05-30 LAB — CMP (CANCER CENTER ONLY)
ALT: 19 U/L (ref 0–44)
AST: 15 U/L (ref 15–41)
Albumin: 4 g/dL (ref 3.5–5.0)
Alkaline Phosphatase: 43 U/L (ref 38–126)
Anion gap: 7 (ref 5–15)
BUN: 25 mg/dL — ABNORMAL HIGH (ref 8–23)
CO2: 25 mmol/L (ref 22–32)
Calcium: 9.7 mg/dL (ref 8.9–10.3)
Chloride: 109 mmol/L (ref 98–111)
Creatinine: 1.12 mg/dL (ref 0.61–1.24)
GFR, Estimated: >60 mL/min (ref >60)
Glucose, Bld: 87 mg/dL (ref 70–99)
Potassium: 4.1 mmol/L (ref 3.5–5.1)
Sodium: 141 mmol/L (ref 135–145)
Total Bilirubin: 0.2 mg/dL — ABNORMAL LOW (ref 0.3–1.2)
Total Protein: 7.1 g/dL (ref 6.5–8.1)

## 2022-05-30 MED ORDER — HEPARIN SOD (PORK) LOCK FLUSH 100 UNIT/ML IV SOLN
500.0000 [IU] | Freq: Once | INTRAVENOUS | Status: AC
  Administered 2022-05-30: 500 [IU]

## 2022-05-30 MED ORDER — SODIUM CHLORIDE 0.9% FLUSH
10.0000 mL | Freq: Once | INTRAVENOUS | Status: AC
  Administered 2022-05-30: 10 mL

## 2022-05-30 NOTE — Progress Notes (Signed)
Hematology and Oncology Follow Up Visit  Jerome Cruz [id number] [birth year] 66 y.o. 05/30/2022 12:49 PM [name], [name], [name], MD   Principle Diagnosis: 66 year old man with bladder cancer diagnosed in September 2022.  He was found to have T3N0 high-grade urothelial carcinom after radical nephrectomy.   Prior Therapy:  He is status post TURBT in September 26, 2021.  Final pathology showed infiltrating high-grade papillary urothelial carcinoma that is poorly differentiated and 80% with a glandular component at 5%.   Neoadjuvant chemotherapy utilizing gemcitabine and cisplatin started on October 30, 2021.  He completed 4 cycles of therapy on January 08, 2022.  He is status post robotic laparoscopic complete cystectomy and pelvic lymph node dissection completed on March 27, 2022.  He was found to have T3N0 residual disease.   Current therapy: Under consideration for additional adjuvant therapy.  Interim History: Jerome Cruz presents today for a repeat evaluation.  Since last visit, he completed radical cystectomy under the care of Dr. Tresa Moore on April 5 without any complications.  Since his discharge, he reports gradual recovery but has developed lower back pain in the last few weeks.  MRI obtained on 05/28/2022 did not show any malignancy but did show degenerative changes and spinal stenosis.  He is currently taking Neurontin which helping with his pain.  He is currently under evaluation for further intervention.    Medications: Reviewed without changes. Current Outpatient Medications  Medication Sig Dispense Refill   cholecalciferol (VITAMIN D3) 25 MCG (1000 UNIT) tablet Take 1,000 Units by mouth daily.     esomeprazole (NEXIUM) 20 MG capsule Take 20 mg by mouth daily.     lidocaine-prilocaine (EMLA) cream Apply 1 application topically as needed. (Patient not taking: Reported on 03/21/2022) 30 g 0   oxyCODONE-acetaminophen (PERCOCET) 5-325 MG tablet Take 1 tablet by mouth every 6 (six)  hours as needed for severe pain (post-operatively). 15 tablet 0   prochlorperazine (COMPAZINE) 10 MG tablet Take 1 tablet (10 mg total) by mouth every 6 (six) hours as needed for nausea or vomiting. (Patient not taking: Reported on 03/21/2022) 30 tablet 0   senna-docusate (SENOKOT-S) 8.6-50 MG tablet Take 1 tablet by mouth 2 (two) times daily. While taking strong pain meds to prevent constipation 10 tablet 0   simvastatin (ZOCOR) 20 MG tablet Take 20 mg by mouth at bedtime.     valsartan (DIOVAN) 80 MG tablet Take 80 mg by mouth every evening.     vitamin B-12 (CYANOCOBALAMIN) 1000 MCG tablet Take 1,000 mcg by mouth daily.     Zoster Vaccine Adjuvanted [facility] injection Shingrix (PF) 50 mcg/0.5 mL intramuscular suspension, kit  ADM 0.5ML IM UTD     No current facility-administered medications for this visit.   Facility-Administered Medications Ordered in Other Visits  Medication Dose Route Frequency Provider Last Rate Last Admin   gemcitabine (GEMZAR) chemo syringe for bladder instillation 2,000 mg  2,000 mg Bladder Instillation Once Robley Fries, MD         Allergies:  Allergies  Allergen Reactions   Atorvastatin Other (See Comments)    Pt unsure of reaction    Irbesartan Other (See Comments)    Pt unsure of reaction       Physical Exam: Blood pressure (!) 157/110, pulse 73, temperature (!) 97.5 F (36.4 C), temperature source Tympanic, resp. rate 18, height 5' 7"  (1.702 m), weight 157 lb (71.2 kg), SpO2 98 %.    ECOG: 0    General appearance: Alert, awake without any  distress. Head: Atraumatic without abnormalities Oropharynx: Without any thrush or ulcers. Eyes: No scleral icterus. Lymph nodes: No lymphadenopathy noted in the cervical, supraclavicular, or axillary nodes Heart:regular rate and rhythm, without any murmurs or gallops.   Lung: Clear to auscultation without any rhonchi, wheezes or dullness to percussion. Abdomin: Soft, nontender without any shifting  dullness or ascites. Musculoskeletal: No clubbing or cyanosis. Neurological: No motor or sensory deficits. Skin: No rashes or lesions.          Lab Results: Lab Results  Component Value Date   WBC 5.7 03/26/2022   HGB 10.0 (L) 04/01/2022   HCT 28.8 (L) 04/01/2022   MCV 98.1 03/26/2022   PLT 143 (L) 03/26/2022   PSA 0.68 03/06/2012     Chemistry      Component Value Date/Time   NA 140 04/01/2022 0345   K 3.5 04/01/2022 0345   CL 107 04/01/2022 0345   CO2 27 04/01/2022 0345   BUN 16 04/01/2022 0345   CREATININE 0.94 04/01/2022 0345   CREATININE 1.04 01/24/2022 1256      Component Value Date/Time   CALCIUM 8.7 (L) 04/01/2022 0345   ALKPHOS 41 03/26/2022 1151   AST 18 03/26/2022 1151   AST 17 01/24/2022 1256   ALT 18 03/26/2022 1151   ALT 13 01/24/2022 1256   BILITOT 0.5 03/26/2022 1151   BILITOT 0.4 01/24/2022 1256          Impression and Plan:  66 year old with:  1.  Bladder cancer diagnosed in September 2022.  He was found to have T3N0 high-grade papillary urothelial carcinoma with residual disease after radical cystectomy.  Treatment choices moving forward were discussed.  Adjuvant nivolumab on a monthly basis to complete 1 year of therapy versus continued surveillance were discussed at this time.  Complication associated with nivolumab were reviewed.  These would include GI toxicity, autoimmune complications among others.  After discussion today, he will like to think about this and let me know in the near future.   2.  IV access: Port-A-Cath will continue to be flushed periodically.   3.  Antiemetics: Compazine is available to him for any nausea or vomiting.   4.  Renal function surveillance: Creatinine clearance remains within normal range after cisplatin.   5.  Neutropenia: Resolved after the therapy completion.  6.  Anemia: Related to chemotherapy and malignancy.  Improved at this time.    7.  Follow-up: Will be determined pending his  discussion and decision regarding nivolumab therapy.   30  minutes were spent on this encounter.  The time was dedicated to reviewing pathology results, treatment choices and addressing complications related to his cancer and cancer therapy.          Zola Button, MD 6/8/202312:49 PM

## 2022-05-31 ENCOUNTER — Telehealth: Payer: Self-pay | Admitting: Oncology

## 2022-05-31 DIAGNOSIS — K5900 Constipation, unspecified: Secondary | ICD-10-CM | POA: Diagnosis not present

## 2022-05-31 DIAGNOSIS — I10 Essential (primary) hypertension: Secondary | ICD-10-CM | POA: Diagnosis not present

## 2022-05-31 NOTE — Telephone Encounter (Signed)
Scheduled per 06/08 los, patient has been called and notified. 

## 2022-06-04 ENCOUNTER — Other Ambulatory Visit: Payer: Self-pay | Admitting: Internal Medicine

## 2022-06-04 DIAGNOSIS — Z936 Other artificial openings of urinary tract status: Secondary | ICD-10-CM | POA: Diagnosis not present

## 2022-06-04 DIAGNOSIS — Z8551 Personal history of malignant neoplasm of bladder: Secondary | ICD-10-CM | POA: Diagnosis not present

## 2022-06-04 DIAGNOSIS — M5416 Radiculopathy, lumbar region: Secondary | ICD-10-CM

## 2022-06-05 ENCOUNTER — Ambulatory Visit
Admission: RE | Admit: 2022-06-05 | Discharge: 2022-06-05 | Disposition: A | Discharge status: Home / Self Care | Payer: BC Managed Care – PPO | Source: Ambulatory Visit | Attending: Internal Medicine | Admitting: Internal Medicine

## 2022-06-05 DIAGNOSIS — M48061 Spinal stenosis, lumbar region without neurogenic claudication: Secondary | ICD-10-CM | POA: Diagnosis not present

## 2022-06-05 DIAGNOSIS — M5416 Radiculopathy, lumbar region: Secondary | ICD-10-CM

## 2022-06-05 DIAGNOSIS — M4727 Other spondylosis with radiculopathy, lumbosacral region: Secondary | ICD-10-CM | POA: Diagnosis not present

## 2022-06-05 MED ORDER — IOPAMIDOL (ISOVUE-M 300) INJECTION 61%
1.0000 mL | Freq: Once | INTRAMUSCULAR | Status: AC
  Administered 2022-06-05: 1 mL via EPIDURAL

## 2022-06-05 MED ORDER — METHYLPREDNISOLONE ACETATE 40 MG/ML INJ SUSP (RADIOLOG
18.0000 mg | Freq: Once | INTRAMUSCULAR | Status: AC
  Administered 2022-06-05: 18 mg via EPIDURAL

## 2022-06-05 NOTE — Discharge Instructions (Signed)

## 2022-06-07 DIAGNOSIS — H6123 Impacted cerumen, bilateral: Secondary | ICD-10-CM | POA: Diagnosis not present

## 2022-06-07 DIAGNOSIS — M5136 Other intervertebral disc degeneration, lumbar region: Secondary | ICD-10-CM | POA: Diagnosis not present

## 2022-06-13 ENCOUNTER — Encounter: Payer: Self-pay | Admitting: Oncology

## 2022-06-13 ENCOUNTER — Other Ambulatory Visit: Payer: Self-pay | Admitting: Internal Medicine

## 2022-06-13 ENCOUNTER — Other Ambulatory Visit (HOSPITAL_COMMUNITY): Payer: Self-pay | Admitting: Internal Medicine

## 2022-06-13 DIAGNOSIS — M5416 Radiculopathy, lumbar region: Secondary | ICD-10-CM

## 2022-06-13 DIAGNOSIS — M533 Sacrococcygeal disorders, not elsewhere classified: Secondary | ICD-10-CM

## 2022-06-13 DIAGNOSIS — M5136 Other intervertebral disc degeneration, lumbar region: Secondary | ICD-10-CM

## 2022-06-14 ENCOUNTER — Ambulatory Visit (HOSPITAL_COMMUNITY)
Admission: RE | Admit: 2022-06-14 | Discharge: 2022-06-14 | Disposition: A | Discharge status: Home / Self Care | Payer: BC Managed Care – PPO | Source: Ambulatory Visit | Attending: Internal Medicine | Admitting: Internal Medicine

## 2022-06-14 DIAGNOSIS — M5136 Other intervertebral disc degeneration, lumbar region: Secondary | ICD-10-CM | POA: Diagnosis not present

## 2022-06-14 DIAGNOSIS — M48061 Spinal stenosis, lumbar region without neurogenic claudication: Secondary | ICD-10-CM | POA: Diagnosis not present

## 2022-06-14 DIAGNOSIS — S32110A Nondisplaced Zone I fracture of sacrum, initial encounter for closed fracture: Secondary | ICD-10-CM | POA: Diagnosis not present

## 2022-06-14 DIAGNOSIS — M533 Sacrococcygeal disorders, not elsewhere classified: Secondary | ICD-10-CM

## 2022-06-14 DIAGNOSIS — Z8551 Personal history of malignant neoplasm of bladder: Secondary | ICD-10-CM | POA: Diagnosis not present

## 2022-06-14 DIAGNOSIS — M2578 Osteophyte, vertebrae: Secondary | ICD-10-CM | POA: Diagnosis not present

## 2022-06-14 DIAGNOSIS — M5126 Other intervertebral disc displacement, lumbar region: Secondary | ICD-10-CM | POA: Diagnosis not present

## 2022-06-14 DIAGNOSIS — M47816 Spondylosis without myelopathy or radiculopathy, lumbar region: Secondary | ICD-10-CM | POA: Diagnosis not present

## 2022-06-14 DIAGNOSIS — K6289 Other specified diseases of anus and rectum: Secondary | ICD-10-CM | POA: Diagnosis not present

## 2022-06-18 ENCOUNTER — Inpatient Hospital Stay
Admission: RE | Admit: 2022-06-18 | Discharge: 2022-06-18 | Disposition: A | Discharge status: Home / Self Care | Payer: BC Managed Care – PPO | Source: Ambulatory Visit | Attending: Internal Medicine | Admitting: Internal Medicine

## 2022-06-20 DIAGNOSIS — Z6825 Body mass index (BMI) 25.0-25.9, adult: Secondary | ICD-10-CM | POA: Diagnosis not present

## 2022-06-20 DIAGNOSIS — M5440 Lumbago with sciatica, unspecified side: Secondary | ICD-10-CM | POA: Diagnosis not present

## 2022-06-20 DIAGNOSIS — S3210XA Unspecified fracture of sacrum, initial encounter for closed fracture: Secondary | ICD-10-CM | POA: Diagnosis not present

## 2022-06-21 ENCOUNTER — Encounter: Payer: Self-pay | Admitting: Surgery

## 2022-06-21 ENCOUNTER — Ambulatory Visit (INDEPENDENT_AMBULATORY_CARE_PROVIDER_SITE_OTHER): Payer: BC Managed Care – PPO | Admitting: Surgery

## 2022-06-21 VITALS — BP 164/82 | HR 98 | Ht 67.0 in | Wt 157.0 lb

## 2022-06-21 DIAGNOSIS — S3210XA Unspecified fracture of sacrum, initial encounter for closed fracture: Secondary | ICD-10-CM

## 2022-06-21 DIAGNOSIS — M5416 Radiculopathy, lumbar region: Secondary | ICD-10-CM

## 2022-06-21 MED ORDER — METHOCARBAMOL 750 MG PO TABS: 750.0000 mg | ORAL_TABLET | Freq: Four times a day (QID) | ORAL | 0 refills | Status: AC | PRN

## 2022-06-21 MED ORDER — KETOROLAC TROMETHAMINE 30 MG/ML IJ SOLN: 30.0000 mg | Freq: Once | INTRAMUSCULAR | Status: AC

## 2022-06-21 MED ORDER — METHYLPREDNISOLONE ACETATE 40 MG/ML IJ SUSP: 40.0000 mg | Freq: Once | INTRAMUSCULAR | Status: DC

## 2022-06-21 NOTE — Progress Notes (Signed)
Office Visit Note   Patient: Jerome Cruz           Date of Birth: 11/23/1956           MRN: [id number] Visit Date: 06/21/2022              Requested by: [name], MD [address] [place],  [place] [address] PCP: [name], MD   Assessment & Plan: Visit Diagnoses:  1. Radiculopathy, lumbar region   2. Closed fracture of sacrum, unspecified portion of sacrum, initial encounter [facility])     Plan: follow up  weeks  Follow-Up Instructions: Return in about 2 weeks (around 07/05/2022) for with [facility]schel Paynter recheck lumbar and sacral fracture.   Orders:  No orders of the defined types were placed in this encounter.  No orders of the defined types were placed in this encounter.     Procedures: No procedures performed   Clinical Data: No additional findings.   Subjective: Chief Complaint  Patient presents with   sacral fracture    HPI  Review of Systems   Objective: Vital Signs: BP (!) 164/82   Pulse 98   Ht '5\' 7"'$  (1.702 m)   Wt 157 lb (71.2 kg)   BMI 24.59 kg/m   Physical Exam  Ortho Exam  Specialty Comments:  No specialty comments available.  Imaging: No results found.   PMFS History: Patient Active Problem List   Diagnosis Date Noted   Port-A-Cath in place 10/30/2021   Bladder cancer ([place]) 10/17/2021   Malignant neoplasm of urinary bladder ([facility]) 10/17/2021   Foot pain 04/20/2012   General medical examination 03/06/2012   HYPERTENSION 09/27/2008   GERD 09/27/2008   Past Medical History:  Diagnosis Date   Bladder tumor 09/26/2021   ED (erectile dysfunction)    GERD (gastroesophageal reflux disease)    Hyperlipidemia    Hypertension     Family History  Problem Relation Age of Onset   Diabetes Mother    Hypertension Mother    Cancer Father        type unknown   Diabetes Mellitus I Son        since age 37   Colon cancer Neg Hx    Rectal cancer Neg Hx    Stomach cancer Neg Hx    Esophageal cancer Neg Hx     Past Surgical History:   Procedure Laterality Date   ANKLE SURGERY Right    COLONOSCOPY  2012   COLONOSCOPY  03/27/2018   CYSTOSCOPY WITH INJECTION N/A 03/27/2022   Procedure: CYSTOSCOPY WITH INJECTION OF INDOCYANINE GREEN DYE;  Surgeon: Alexis Frock, MD;  Location: [facility];  Service: Urology;  Laterality: N/A;   CYSTOSCOPY WITH URETHRAL DILATATION N/A 10/02/2021   Procedure: CYSTOSCOPY WITH URETHRAL BALLOON DILATATION;  Surgeon: Robley Fries, MD;  Location: [facility];  Service: Urology;  Laterality: N/A;   IR IMAGING GUIDED PORT INSERTION  10/24/2021   LYMPH NODE DISSECTION Bilateral 03/27/2022   Procedure: PELVIC LYMPH NODE DISSECTION;  Surgeon: Alexis Frock, MD;  Location: [facility];  Service: Urology;  Laterality: Bilateral;   ROBOT ASSISTED LAPAROSCOPIC COMPLETE CYSTECT ILEAL CONDUIT N/A 03/27/2022   Procedure: XI ROBOTIC ASSISTED LAPAROSCOPIC COMPLETE CYSTECT ILEAL CONDUIT;  Surgeon: Alexis Frock, MD;  Location: [facility];  Service: Urology;  Laterality: N/A;  6 HRS   ROBOT ASSISTED LAPAROSCOPIC RADICAL PROSTATECTOMY N/A 03/27/2022   Procedure: XI ROBOTIC ASSISTED LAPAROSCOPIC RADICAL PROSTATECTOMY;  Surgeon: Alexis Frock, MD;  Location: [facility];  Service:  Urology;  Laterality: N/A;   TRANSURETHRAL RESECTION OF BLADDER TUMOR WITH MITOMYCIN-C N/A 10/02/2021   Procedure: TRANSURETHRAL RESECTION OF BLADDER TUMOR WITH GEMCITABINE;  Surgeon: Robley Fries, MD;  Location: [facility];  Service: Urology;  Laterality: N/A;  76 MINS   Social History   Occupational History   Occupation: self employed-grading / concrete  Tobacco Use   Smoking status: Former    Packs/day: 1.00    Years: 15.00    Total pack years: 15.00    Types: Cigarettes    Quit date: 08/23/2001    Years since quitting: 20.8   Smokeless tobacco: Never  Vaping Use   Vaping Use: Never used  Substance and Sexual Activity   Alcohol use: Yes    Comment: occasional beer   Drug use: No   Sexual activity: Not on  file

## 2022-06-24 ENCOUNTER — Telehealth: Payer: Self-pay | Admitting: Surgery

## 2022-06-24 ENCOUNTER — Telehealth: Payer: Self-pay | Admitting: Physical Medicine and Rehabilitation

## 2022-06-24 ENCOUNTER — Other Ambulatory Visit: Payer: Self-pay | Admitting: Surgery

## 2022-06-24 ENCOUNTER — Other Ambulatory Visit: Payer: Self-pay | Admitting: Internal Medicine

## 2022-06-24 DIAGNOSIS — M5416 Radiculopathy, lumbar region: Secondary | ICD-10-CM

## 2022-06-24 NOTE — Telephone Encounter (Signed)
Pt wife called for an update on a steroid back injection

## 2022-06-24 NOTE — Telephone Encounter (Signed)
Ok to order [facility]?

## 2022-06-24 NOTE — Telephone Encounter (Signed)
Pt called requesting a call to set an referral appt for an epidural. Please call pt at [phone]

## 2022-06-26 ENCOUNTER — Other Ambulatory Visit: Payer: Self-pay | Admitting: Surgery

## 2022-06-26 ENCOUNTER — Ambulatory Visit
Admission: RE | Admit: 2022-06-26 | Discharge: 2022-06-26 | Disposition: A | Discharge status: Home / Self Care | Payer: BC Managed Care – PPO | Source: Ambulatory Visit | Attending: Surgery | Admitting: Surgery

## 2022-06-26 DIAGNOSIS — M79661 Pain in right lower leg: Secondary | ICD-10-CM | POA: Diagnosis not present

## 2022-06-26 DIAGNOSIS — M79662 Pain in left lower leg: Secondary | ICD-10-CM | POA: Diagnosis not present

## 2022-06-26 DIAGNOSIS — M5416 Radiculopathy, lumbar region: Secondary | ICD-10-CM

## 2022-06-26 MED ORDER — METHYLPREDNISOLONE ACETATE 40 MG/ML INJ SUSP (RADIOLOG
80.0000 mg | Freq: Once | INTRAMUSCULAR | Status: AC
  Administered 2022-06-26: 120 mg via EPIDURAL

## 2022-06-26 MED ORDER — IOPAMIDOL (ISOVUE-M 200) INJECTION 41%
1.0000 mL | Freq: Once | INTRAMUSCULAR | Status: AC
  Administered 2022-06-26: 1 mL via EPIDURAL

## 2022-06-26 NOTE — Discharge Instructions (Signed)

## 2022-06-28 ENCOUNTER — Other Ambulatory Visit: Payer: Self-pay | Admitting: Internal Medicine

## 2022-06-28 DIAGNOSIS — S3210XA Unspecified fracture of sacrum, initial encounter for closed fracture: Secondary | ICD-10-CM

## 2022-07-01 ENCOUNTER — Ambulatory Visit
Admission: RE | Admit: 2022-07-01 | Discharge: 2022-07-01 | Disposition: A | Discharge status: Home / Self Care | Payer: BC Managed Care – PPO | Source: Ambulatory Visit | Attending: Internal Medicine | Admitting: Internal Medicine

## 2022-07-01 DIAGNOSIS — S3210XA Unspecified fracture of sacrum, initial encounter for closed fracture: Secondary | ICD-10-CM

## 2022-07-01 DIAGNOSIS — S32110A Nondisplaced Zone I fracture of sacrum, initial encounter for closed fracture: Secondary | ICD-10-CM | POA: Diagnosis not present

## 2022-07-01 MED ORDER — GADOBENATE DIMEGLUMINE 529 MG/ML IV SOLN
15.0000 mL | Freq: Once | INTRAVENOUS | Status: AC | PRN
  Administered 2022-07-01: 15 mL via INTRAVENOUS

## 2022-07-02 ENCOUNTER — Other Ambulatory Visit: Payer: Self-pay | Admitting: Internal Medicine

## 2022-07-02 ENCOUNTER — Other Ambulatory Visit (HOSPITAL_COMMUNITY): Payer: Self-pay | Admitting: Internal Medicine

## 2022-07-02 ENCOUNTER — Other Ambulatory Visit: Payer: Self-pay | Admitting: Oncology

## 2022-07-02 DIAGNOSIS — C679 Malignant neoplasm of bladder, unspecified: Secondary | ICD-10-CM

## 2022-07-02 DIAGNOSIS — S3210XA Unspecified fracture of sacrum, initial encounter for closed fracture: Secondary | ICD-10-CM

## 2022-07-03 ENCOUNTER — Telehealth: Payer: Self-pay | Admitting: Oncology

## 2022-07-03 NOTE — Telephone Encounter (Signed)
.  Called patient to schedule appointment per 7/12 inbasket, patient is aware of date and time.   

## 2022-07-04 ENCOUNTER — Telehealth: Payer: Self-pay | Admitting: *Deleted

## 2022-07-04 ENCOUNTER — Other Ambulatory Visit: Payer: Self-pay | Admitting: Oncology

## 2022-07-04 MED ORDER — OXYCODONE HCL 5 MG PO TABS: 5.0000 mg | ORAL_TABLET | ORAL | 0 refills | Status: AC | PRN

## 2022-07-04 NOTE — Telephone Encounter (Signed)
Returned PC to patient, informed him Dr. Alen Blew advises that he take oxycodone around the clock, every 4-6 hours.  New rx is being sent to his pharmacy.  Also informed patient he should have pain relief once his radiation begins.  Cautioned patient about constipation with narcotic use.  He verbalizes understanding.

## 2022-07-04 NOTE — Telephone Encounter (Signed)
-----   Message from Wyatt Portela, MD sent at 07/04/2022  9:13 AM EDT ----- Regarding: RE: Pain There is no oxycodone on his list there is Percocet.  Percocet has been discontinued and a new prescription for oxycodone sent to his pharmacy. ----- Message ----- From: Rolene Course, RN Sent: 07/04/2022   9:03 AM EDT To: Wyatt Portela, MD Subject: RE: Pain                                       He does need more oxycodone.  ----- Message ----- From: Wyatt Portela, MD Sent: 07/04/2022   8:46 AM EDT To: Rolene Course, RN Subject: RE: Pain                                       He needs to schedule Oxycodone around to clock. Every 4 to 6 hrs. Let me know if needs more. Radiation will help when it starts ----- Message ----- From: Rolene Course, RN Sent: 07/04/2022   8:38 AM EDT To: Wyatt Portela, MD Subject: Pain                                           Jerome Cruz called this morning, he began having severe pain last night in his pelvis, lower back & down his L leg.  He took oxycodone twice during the night, also applied ice to the area, but hasn't had any relief.  Please advise.  Thanks, Bethena Roys

## 2022-07-05 ENCOUNTER — Other Ambulatory Visit: Payer: BC Managed Care – PPO

## 2022-07-08 ENCOUNTER — Inpatient Hospital Stay: Payer: BC Managed Care – PPO | Attending: Oncology | Admitting: Oncology

## 2022-07-08 ENCOUNTER — Inpatient Hospital Stay (HOSPITAL_COMMUNITY)
Admission: EM | Admit: 2022-07-08 | Discharge: 2022-07-23 | DRG: 542 | Disposition: E | Payer: BC Managed Care – PPO | Attending: Internal Medicine | Admitting: Internal Medicine

## 2022-07-08 ENCOUNTER — Other Ambulatory Visit: Payer: Self-pay

## 2022-07-08 ENCOUNTER — Encounter: Payer: Self-pay | Admitting: Oncology

## 2022-07-08 ENCOUNTER — Emergency Department (HOSPITAL_COMMUNITY): Payer: BC Managed Care – PPO

## 2022-07-08 ENCOUNTER — Inpatient Hospital Stay: Payer: BC Managed Care – PPO

## 2022-07-08 ENCOUNTER — Encounter (HOSPITAL_COMMUNITY): Payer: Self-pay

## 2022-07-08 VITALS — BP 160/129 | HR 89 | Temp 98.1°F | Resp 19 | Ht 67.0 in

## 2022-07-08 DIAGNOSIS — M5459 Other low back pain: Secondary | ICD-10-CM | POA: Diagnosis present

## 2022-07-08 DIAGNOSIS — D6481 Anemia due to antineoplastic chemotherapy: Secondary | ICD-10-CM | POA: Diagnosis present

## 2022-07-08 DIAGNOSIS — K56609 Unspecified intestinal obstruction, unspecified as to partial versus complete obstruction: Secondary | ICD-10-CM | POA: Diagnosis present

## 2022-07-08 DIAGNOSIS — Z9221 Personal history of antineoplastic chemotherapy: Secondary | ICD-10-CM

## 2022-07-08 DIAGNOSIS — Z8249 Family history of ischemic heart disease and other diseases of the circulatory system: Secondary | ICD-10-CM

## 2022-07-08 DIAGNOSIS — Z9181 History of falling: Secondary | ICD-10-CM

## 2022-07-08 DIAGNOSIS — D72829 Elevated white blood cell count, unspecified: Secondary | ICD-10-CM

## 2022-07-08 DIAGNOSIS — E785 Hyperlipidemia, unspecified: Secondary | ICD-10-CM | POA: Diagnosis present

## 2022-07-08 DIAGNOSIS — K567 Ileus, unspecified: Secondary | ICD-10-CM | POA: Diagnosis present

## 2022-07-08 DIAGNOSIS — I10 Essential (primary) hypertension: Secondary | ICD-10-CM | POA: Diagnosis present

## 2022-07-08 DIAGNOSIS — N179 Acute kidney failure, unspecified: Secondary | ICD-10-CM | POA: Diagnosis present

## 2022-07-08 DIAGNOSIS — D630 Anemia in neoplastic disease: Secondary | ICD-10-CM | POA: Diagnosis present

## 2022-07-08 DIAGNOSIS — A419 Sepsis, unspecified organism: Secondary | ICD-10-CM | POA: Diagnosis present

## 2022-07-08 DIAGNOSIS — K659 Peritonitis, unspecified: Secondary | ICD-10-CM | POA: Diagnosis present

## 2022-07-08 DIAGNOSIS — M8440XG Pathological fracture, unspecified site, subsequent encounter for fracture with delayed healing: Principal | ICD-10-CM

## 2022-07-08 DIAGNOSIS — E871 Hypo-osmolality and hyponatremia: Secondary | ICD-10-CM | POA: Diagnosis present

## 2022-07-08 DIAGNOSIS — D649 Anemia, unspecified: Secondary | ICD-10-CM | POA: Diagnosis present

## 2022-07-08 DIAGNOSIS — K7689 Other specified diseases of liver: Secondary | ICD-10-CM | POA: Diagnosis not present

## 2022-07-08 DIAGNOSIS — M8458XA Pathological fracture in neoplastic disease, other specified site, initial encounter for fracture: Secondary | ICD-10-CM | POA: Diagnosis not present

## 2022-07-08 DIAGNOSIS — Z6820 Body mass index (BMI) 20.0-20.9, adult: Secondary | ICD-10-CM

## 2022-07-08 DIAGNOSIS — E43 Unspecified severe protein-calorie malnutrition: Secondary | ICD-10-CM | POA: Diagnosis present

## 2022-07-08 DIAGNOSIS — R066 Hiccough: Secondary | ICD-10-CM | POA: Diagnosis not present

## 2022-07-08 DIAGNOSIS — E872 Acidosis, unspecified: Secondary | ICD-10-CM | POA: Diagnosis present

## 2022-07-08 DIAGNOSIS — Z515 Encounter for palliative care: Secondary | ICD-10-CM

## 2022-07-08 DIAGNOSIS — Z66 Do not resuscitate: Secondary | ICD-10-CM | POA: Diagnosis not present

## 2022-07-08 DIAGNOSIS — K828 Other specified diseases of gallbladder: Secondary | ICD-10-CM | POA: Diagnosis not present

## 2022-07-08 DIAGNOSIS — C679 Malignant neoplasm of bladder, unspecified: Secondary | ICD-10-CM

## 2022-07-08 DIAGNOSIS — Z809 Family history of malignant neoplasm, unspecified: Secondary | ICD-10-CM

## 2022-07-08 DIAGNOSIS — C787 Secondary malignant neoplasm of liver and intrahepatic bile duct: Secondary | ICD-10-CM | POA: Diagnosis present

## 2022-07-08 DIAGNOSIS — R52 Pain, unspecified: Secondary | ICD-10-CM

## 2022-07-08 DIAGNOSIS — M545 Low back pain, unspecified: Secondary | ICD-10-CM | POA: Diagnosis not present

## 2022-07-08 DIAGNOSIS — R6521 Severe sepsis with septic shock: Secondary | ICD-10-CM | POA: Diagnosis present

## 2022-07-08 DIAGNOSIS — G893 Neoplasm related pain (acute) (chronic): Secondary | ICD-10-CM | POA: Diagnosis present

## 2022-07-08 DIAGNOSIS — N200 Calculus of kidney: Secondary | ICD-10-CM | POA: Diagnosis not present

## 2022-07-08 DIAGNOSIS — Z888 Allergy status to other drugs, medicaments and biological substances status: Secondary | ICD-10-CM

## 2022-07-08 DIAGNOSIS — M533 Sacrococcygeal disorders, not elsewhere classified: Secondary | ICD-10-CM | POA: Diagnosis present

## 2022-07-08 DIAGNOSIS — D62 Acute posthemorrhagic anemia: Secondary | ICD-10-CM | POA: Diagnosis present

## 2022-07-08 DIAGNOSIS — Z79899 Other long term (current) drug therapy: Secondary | ICD-10-CM

## 2022-07-08 DIAGNOSIS — Z906 Acquired absence of other parts of urinary tract: Secondary | ICD-10-CM

## 2022-07-08 DIAGNOSIS — Z87891 Personal history of nicotine dependence: Secondary | ICD-10-CM

## 2022-07-08 DIAGNOSIS — C7951 Secondary malignant neoplasm of bone: Secondary | ICD-10-CM | POA: Diagnosis present

## 2022-07-08 DIAGNOSIS — C7800 Secondary malignant neoplasm of unspecified lung: Secondary | ICD-10-CM | POA: Diagnosis present

## 2022-07-08 DIAGNOSIS — K219 Gastro-esophageal reflux disease without esophagitis: Secondary | ICD-10-CM | POA: Diagnosis present

## 2022-07-08 DIAGNOSIS — K559 Vascular disorder of intestine, unspecified: Secondary | ICD-10-CM | POA: Diagnosis present

## 2022-07-08 DIAGNOSIS — D72819 Decreased white blood cell count, unspecified: Secondary | ICD-10-CM | POA: Diagnosis not present

## 2022-07-08 LAB — CBC WITH DIFFERENTIAL/PLATELET
Abs Immature Granulocytes: 0.06 10*3/uL (ref 0.00–0.07)
Basophils Absolute: 0 10*3/uL (ref 0.0–0.1)
Basophils Relative: 0 %
Eosinophils Absolute: 0.1 10*3/uL (ref 0.0–0.5)
Eosinophils Relative: 1 %
HCT: 31.8 % — ABNORMAL LOW (ref 39.0–52.0)
Hemoglobin: 10.8 g/dL — ABNORMAL LOW (ref 13.0–17.0)
Immature Granulocytes: 1 %
Lymphocytes Relative: 23 %
Lymphs Abs: 2.8 10*3/uL (ref 0.7–4.0)
MCH: 32.5 pg (ref 26.0–34.0)
MCHC: 34 g/dL (ref 30.0–36.0)
MCV: 95.8 fL (ref 80.0–100.0)
Monocytes Absolute: 0.7 10*3/uL (ref 0.1–1.0)
Monocytes Relative: 6 %
Neutro Abs: 8.6 10*3/uL — ABNORMAL HIGH (ref 1.7–7.7)
Neutrophils Relative %: 69 %
Platelets: 281 10*3/uL (ref 150–400)
RBC: 3.32 MIL/uL — ABNORMAL LOW (ref 4.22–5.81)
RDW: 15 % (ref 11.5–15.5)
WBC: 12.2 10*3/uL — ABNORMAL HIGH (ref 4.0–10.5)
nRBC: 0 % (ref 0.0–0.2)

## 2022-07-08 LAB — BASIC METABOLIC PANEL
Anion gap: 15 (ref 5–15)
BUN: 38 mg/dL — ABNORMAL HIGH (ref 8–23)
CO2: 18 mmol/L — ABNORMAL LOW (ref 22–32)
Calcium: 9.7 mg/dL (ref 8.9–10.3)
Chloride: 104 mmol/L (ref 98–111)
Creatinine, Ser: 1.19 mg/dL (ref 0.61–1.24)
GFR, Estimated: >60 mL/min (ref >60)
Glucose, Bld: 124 mg/dL — ABNORMAL HIGH (ref 70–99)
Potassium: 4.1 mmol/L (ref 3.5–5.1)
Sodium: 137 mmol/L (ref 135–145)

## 2022-07-08 LAB — PROTIME-INR
INR: 1.1 (ref 0.8–1.2)
Prothrombin Time: 13.7 seconds (ref 11.4–15.2)

## 2022-07-08 MED ORDER — IRBESARTAN 75 MG PO TABS: 75.0000 mg | ORAL_TABLET | Freq: Every day | ORAL | Status: DC

## 2022-07-08 MED ORDER — ACETAMINOPHEN 650 MG RE SUPP: 650.0000 mg | Freq: Four times a day (QID) | RECTAL | Status: DC | PRN

## 2022-07-08 MED ORDER — PANTOPRAZOLE SODIUM 40 MG PO TBEC
40.0000 mg | DELAYED_RELEASE_TABLET | Freq: Every day | ORAL | Status: DC
  Administered 2022-07-09 – 2022-07-13 (×5): 40 mg via ORAL
  Filled 2022-07-08 (×5): qty 1

## 2022-07-08 MED ORDER — MAGNESIUM HYDROXIDE 400 MG/5ML PO SUSP
15.0000 mL | Freq: Every day | ORAL | Status: DC
Start: 2022-07-08 — End: 2022-07-12
  Administered 2022-07-10 – 2022-07-11 (×2): 15 mL via ORAL
  Filled 2022-07-08 (×3): qty 30

## 2022-07-08 MED ORDER — OXYCODONE HCL 5 MG PO TABS
10.0000 mg | ORAL_TABLET | ORAL | Status: DC | PRN
  Administered 2022-07-08 – 2022-07-10 (×7): 10 mg via ORAL
  Filled 2022-07-08 (×7): qty 2

## 2022-07-08 MED ORDER — KETOROLAC TROMETHAMINE 15 MG/ML IJ SOLN
15.0000 mg | Freq: Once | INTRAMUSCULAR | Status: AC
  Administered 2022-07-08: 15 mg via INTRAVENOUS
  Filled 2022-07-08: qty 1

## 2022-07-08 MED ORDER — GABAPENTIN 300 MG PO CAPS
300.0000 mg | ORAL_CAPSULE | Freq: Two times a day (BID) | ORAL | Status: DC
  Administered 2022-07-08 – 2022-07-10 (×4): 300 mg via ORAL
  Filled 2022-07-08 (×4): qty 1

## 2022-07-08 MED ORDER — HYDROMORPHONE HCL 1 MG/ML IJ SOLN
1.0000 mg | Freq: Once | INTRAMUSCULAR | Status: AC
  Administered 2022-07-08: 1 mg via INTRAVENOUS
  Filled 2022-07-08: qty 1

## 2022-07-08 MED ORDER — VALSARTAN 80 MG PO TABS
80.0000 mg | ORAL_TABLET | Freq: Every day | ORAL | Status: DC
  Filled 2022-07-08 (×2): qty 1

## 2022-07-08 MED ORDER — KETOROLAC TROMETHAMINE 15 MG/ML IJ SOLN
15.0000 mg | Freq: Four times a day (QID) | INTRAMUSCULAR | Status: DC
  Administered 2022-07-08 – 2022-07-10 (×7): 15 mg via INTRAVENOUS
  Filled 2022-07-08 (×7): qty 1

## 2022-07-08 MED ORDER — IOHEXOL 300 MG/ML  SOLN
100.0000 mL | Freq: Once | INTRAMUSCULAR | Status: AC | PRN
  Administered 2022-07-08: 100 mL via INTRAVENOUS

## 2022-07-08 MED ORDER — SIMVASTATIN 20 MG PO TABS
20.0000 mg | ORAL_TABLET | Freq: Every day | ORAL | Status: DC
  Administered 2022-07-09 – 2022-07-13 (×5): 20 mg via ORAL
  Filled 2022-07-08 (×6): qty 1

## 2022-07-08 MED ORDER — FENTANYL CITRATE PF 50 MCG/ML IJ SOSY
50.0000 ug | PREFILLED_SYRINGE | INTRAMUSCULAR | Status: DC | PRN
  Administered 2022-07-08: 50 ug via INTRAVENOUS
  Filled 2022-07-08: qty 1

## 2022-07-08 MED ORDER — HYDROMORPHONE HCL 1 MG/ML IJ SOLN: 1.0000 mg | INTRAMUSCULAR | Status: DC | PRN

## 2022-07-08 MED ORDER — SENNOSIDES-DOCUSATE SODIUM 8.6-50 MG PO TABS
1.0000 | ORAL_TABLET | Freq: Two times a day (BID) | ORAL | Status: DC
Start: 2022-07-08 — End: 2022-07-12
  Administered 2022-07-08 – 2022-07-11 (×7): 1 via ORAL
  Filled 2022-07-08 (×8): qty 1

## 2022-07-08 MED ORDER — ACETAMINOPHEN 325 MG PO TABS
650.0000 mg | ORAL_TABLET | Freq: Four times a day (QID) | ORAL | Status: DC | PRN
  Administered 2022-07-12: 650 mg via ORAL
  Filled 2022-07-08: qty 2

## 2022-07-08 MED ORDER — OXYCODONE HCL 5 MG PO TABS: 5.0000 mg | ORAL_TABLET | ORAL | Status: DC | PRN

## 2022-07-08 MED ORDER — HYDROMORPHONE HCL 1 MG/ML IJ SOLN
1.0000 mg | INTRAMUSCULAR | Status: DC | PRN
  Administered 2022-07-08 – 2022-07-09 (×6): 1 mg via INTRAVENOUS
  Filled 2022-07-08 (×6): qty 1

## 2022-07-08 MED ORDER — PROCHLORPERAZINE EDISYLATE 10 MG/2ML IJ SOLN
5.0000 mg | Freq: Four times a day (QID) | INTRAMUSCULAR | Status: AC | PRN
  Administered 2022-07-09 (×2): 5 mg via INTRAVENOUS
  Filled 2022-07-08 (×2): qty 2

## 2022-07-08 NOTE — Progress Notes (Signed)
Per Dr. Alen Blew patient taken to ED room #6 for evaluation for intractable pain in sacrum.  Bedside report given to ED RN.

## 2022-07-08 NOTE — H&P (Signed)
History and Physical    Patient: Jerome Cruz JWJ:[id number] DOB: 17-Feb-1956 DOA: 06/27/2022 DOS: the patient was seen and examined on 06/27/2022 PCP: [name], MD  Patient coming from: Home  Chief Complaint:  Chief Complaint  Patient presents with   Back Pain   HPI: Jerome Cruz is a 66 y.o. male with medical history significant of bladder adenocarcinoma with a history of prostatectomy, cystectomy with ileal conduit, ADD, GERD, hyperlipidemia, hypertension who was referred from the cancer center due to persistent lower back pain despite taking oxycodone after having a fall last month that resulted in pathological sacral fracture.  His lower back pain radiates to the left leg.  He has to use oxycodone, methocarbamol and hydrocodone without significant results.  Denied fever, chills, rhinorrhea, sore throat, wheezing or hemoptysis.  No chest pain, palpitations, diaphoresis, PND, orthopnea or pitting edema of the lower extremities.  No abdominal pain, nausea, emesis, diarrhea, constipation, melena or hematochezia.  No flank pain, dysuria, frequency or hematuria.  No polyuria, polydipsia, polyphagia or blurred vision.   ED course: Initial vital signs were temperature 99 F, pulse 65, respiration 20, BP 133/86 mmHg O2 sat 100% on room air.  The patient received hydromorphone 1 mg IVP and 15 mg of Toradol in the emergency department sustaining significant relief.  Lab work: CBC is her white count 12.2, hemoglobin 10.8 g/dL platelets 281.  PT was 13.7 and INR 1.1.  BMP with normal electrolytes except for CO2 18 mmol/L.  Glucose 124, BUN 30 and creatinine 1.19 mg/dL.  Imaging: MRI of the pelvis done a week ago showed nondisplaced fracture of the left sacrum at S3, zone 1/2 with distention to the left SI joint and likely into the left S3 neural foramen.  CT abdomen/pelvis with contrast with interval appearance of multiple nodules in both lower lung field and liver suggesting pulmonary/hepatic,  metastatic disease.  Interval appearance of multiple soft tissues masses in the pelvic cavity with the largest measuring 4.9 cm in diameter.   Review of Systems: As mentioned in the history of present illness. All other systems reviewed and are negative.  Past Medical History:  Diagnosis Date   Bladder tumor 09/26/2021   ED (erectile dysfunction)    GERD (gastroesophageal reflux disease)    Hyperlipidemia    Hypertension    Past Surgical History:  Procedure Laterality Date   ANKLE SURGERY Right    COLONOSCOPY  2012   COLONOSCOPY  03/27/2018   CYSTOSCOPY WITH INJECTION N/A 03/27/2022   Procedure: CYSTOSCOPY WITH INJECTION OF INDOCYANINE GREEN DYE;  Surgeon: Alexis Frock, MD;  Location: [facility];  Service: Urology;  Laterality: N/A;   CYSTOSCOPY WITH URETHRAL DILATATION N/A 10/02/2021   Procedure: CYSTOSCOPY WITH URETHRAL BALLOON DILATATION;  Surgeon: Robley Fries, MD;  Location: [facility];  Service: Urology;  Laterality: N/A;   IR IMAGING GUIDED PORT INSERTION  10/24/2021   LYMPH NODE DISSECTION Bilateral 03/27/2022   Procedure: PELVIC LYMPH NODE DISSECTION;  Surgeon: Alexis Frock, MD;  Location: [facility];  Service: Urology;  Laterality: Bilateral;   ROBOT ASSISTED LAPAROSCOPIC COMPLETE CYSTECT ILEAL CONDUIT N/A 03/27/2022   Procedure: XI ROBOTIC ASSISTED LAPAROSCOPIC COMPLETE CYSTECT ILEAL CONDUIT;  Surgeon: Alexis Frock, MD;  Location: [facility];  Service: Urology;  Laterality: N/A;  6 HRS   ROBOT ASSISTED LAPAROSCOPIC RADICAL PROSTATECTOMY N/A 03/27/2022   Procedure: XI ROBOTIC ASSISTED LAPAROSCOPIC RADICAL PROSTATECTOMY;  Surgeon: Alexis Frock, MD;  Location: [facility];  Service: Urology;  Laterality: N/A;  TRANSURETHRAL RESECTION OF BLADDER TUMOR WITH MITOMYCIN-C N/A 10/02/2021   Procedure: TRANSURETHRAL RESECTION OF BLADDER TUMOR WITH GEMCITABINE;  Surgeon: Robley Fries, MD;  Location: [facility];  Service: Urology;  Laterality: N/A;  5 MINS    Social History:  reports that he quit smoking about 20 years ago. His smoking use included cigarettes. He has a 15.00 pack-year smoking history. He has never used smokeless tobacco. He reports current alcohol use. He reports that he does not use drugs.  Allergies  Allergen Reactions   Atorvastatin Other (See Comments)    Pt unsure of reaction    Irbesartan Other (See Comments)    Pt unsure of reaction     Family History  Problem Relation Age of Onset   Diabetes Mother    Hypertension Mother    Cancer Father        type unknown   Diabetes Mellitus I Son        since age 68   Colon cancer Neg Hx    Rectal cancer Neg Hx    Stomach cancer Neg Hx    Esophageal cancer Neg Hx     Prior to Admission medications   Medication Sig Start Date End Date Taking? Authorizing Provider  acetaminophen (TYLENOL) 500 MG tablet Take 500 mg by mouth every 6 (six) hours as needed for moderate pain.   Yes [provider]  esomeprazole (NEXIUM) 20 MG capsule Take 20 mg by mouth daily.   Yes [provider]  gabapentin (NEURONTIN) 300 MG capsule Take 300 mg by mouth 2 (two) times daily. 07/04/22  Yes [provider]  ibuprofen (ADVIL) 200 MG tablet Take 200 mg by mouth every 6 (six) hours as needed for moderate pain.   Yes [provider]  oxyCODONE (OXY IR/ROXICODONE) 5 MG immediate release tablet Take 1 tablet (5 mg total) by mouth every 4 (four) hours as needed for severe pain. Take 1 to 2 tablets every 4 hours for pain. 07/04/22  Yes Wyatt Portela, MD  simvastatin (ZOCOR) 20 MG tablet Take 20 mg by mouth daily.   Yes [provider]  valsartan (DIOVAN) 80 MG tablet Take 80 mg by mouth in the morning.   Yes [provider]  lidocaine-prilocaine (EMLA) cream Apply 1 application topically as needed. Patient not taking: Reported on 03/21/2022 10/17/21   Wyatt Portela, MD  methocarbamol (ROBAXIN) 750 MG tablet Take 1 tablet (750 mg total) by mouth  every 6 (six) hours as needed for muscle spasms. Patient not taking: Reported on 06/29/2022 06/21/22   Lanae Crumbly, PA-C  prochlorperazine (COMPAZINE) 10 MG tablet Take 1 tablet (10 mg total) by mouth every 6 (six) hours as needed for nausea or vomiting. Patient not taking: Reported on 03/21/2022 10/17/21   Wyatt Portela, MD  senna-docusate (SENOKOT-S) 8.6-50 MG tablet Take 1 tablet by mouth 2 (two) times daily. While taking strong pain meds to prevent constipation Patient not taking: Reported on 07/20/2022 04/02/22   Alexis Frock, MD    Physical Exam: Vitals:   06/29/2022 1234 06/30/2022 1315 06/24/2022 1420 07/12/2022 1558  BP:  119/79 112/76 116/72  Pulse:  95 90 89  Resp:  '17 18 15  '$ Temp:    98.3 F (36.8 C)  TempSrc:    Oral  SpO2:  96% 97% 96%  Weight: 63.5 kg     Height: '5\' 9"'$  (1.753 m)      Physical Exam Vitals and nursing note reviewed.  Constitutional:      Appearance: Normal appearance. He is normal weight.  HENT:     Head: Normocephalic.     Mouth/Throat:     Mouth: Mucous membranes are moist.  Eyes:     General: No scleral icterus.    Pupils: Pupils are equal, round, and reactive to light.  Neck:     Vascular: No JVD.  Cardiovascular:     Rate and Rhythm: Normal rate and regular rhythm.     Heart sounds: S1 normal and S2 normal.  Pulmonary:     Effort: Pulmonary effort is normal.     Breath sounds: Normal breath sounds. No wheezing, rhonchi or rales.  Abdominal:     General: Abdomen is flat. The ostomy site is clean. Bowel sounds are normal. There is no distension.     Palpations: Abdomen is soft.     Tenderness: There is no abdominal tenderness. There is no right CVA tenderness or left CVA tenderness.  Musculoskeletal:     Cervical back: Neck supple.     Right lower leg: No edema.     Left lower leg: No edema.  Skin:    General: Skin is warm and dry.  Neurological:     General: No focal deficit present.     Mental Status: He is alert and oriented to  person, place, and time.  Psychiatric:        Mood and Affect: Mood normal.        Behavior: Behavior normal. Behavior is cooperative.   Data Reviewed:  There are no new results to review at this time.  Assessment and Plan: Principal Problem:   Intractable low back pain In the setting of metastatic   Malignant neoplasm of urinary bladder (HCC) Observation/MedSurg. Analgesics as needed. Antiemetics as needed. Follow-up imaging in the morning. Orthopedic consult surgery pending  Active Problems:   Essential hypertension Continue valsartan 80 mg p.o. daily. Monitor blood pressure, renal function electrolytes.    GERD Continue PPI.    Normocytic anemia Monitor hematocrit and hemoglobin.    Hyperlipidemia Continue simvastatin 20 mg p.o. daily.     Advance Care Planning:   Code Status: Full Code   Consults:   Family Communication:   Severity of Illness: The appropriate patient status for this patient is OBSERVATION. Observation status is judged to be reasonable and necessary in order to provide the required intensity of service to ensure the patient's safety. The patient's presenting symptoms, physical exam findings, and initial radiographic and laboratory data in the context of their medical condition is felt to place them at decreased risk for further clinical deterioration. Furthermore, it is anticipated that the patient will be medically stable for discharge from the hospital within 2 midnights of admission.   Author: Reubin Milan, MD 06/24/2022 4:02 PM  For on call review [email or web address].   This document was prepared using Dragon voice recognition software and may contain some unintended transcription errors.

## 2022-07-08 NOTE — ED Triage Notes (Signed)
Pt sent over from cancer center d/t lower back pain that radiates left leg.  Pt's spouse reports a fall in June w/ sacral fx.

## 2022-07-08 NOTE — ED Provider Notes (Addendum)
[facility] DEPT Provider Note   CSN: [id number] Arrival date & time: 07/18/2022  1225     History Bladder cancer Chief Complaint  Patient presents with   Back Pain    Jerome Cruz is a 66 y.o. male.     Back Pain Associated symptoms: no abdominal pain, no chest pain, no dysuria and no fever    Patient presenting to the emergency department with sacral pain.  Patient reports 5 weeks of severe pain.  He fell previously had MRI which demonstrated fracture of sacrum.  He has been taking oxycodone at home with no relief.  Denies fevers, chills, nausea, vomiting, numbness.  Reports weakness in the left leg due to pain.  Denies bowel incontinence.  Has ileal conduit which is draining normally.  Is able to ambulate.  He saw his oncologist today who advised him to come to the emergency department for further evaluation and pain control.     Home Medications Prior to Admission medications   Medication Sig Start Date End Date Taking? Authorizing Provider  cholecalciferol (VITAMIN D3) 25 MCG (1000 UNIT) tablet Take 1,000 Units by mouth daily.    [provider]  esomeprazole (NEXIUM) 20 MG capsule Take 20 mg by mouth daily.    [provider]  lidocaine-prilocaine (EMLA) cream Apply 1 application topically as needed. Patient not taking: Reported on 03/21/2022 10/17/21   Wyatt Portela, MD  methocarbamol (ROBAXIN) 750 MG tablet Take 1 tablet (750 mg total) by mouth every 6 (six) hours as needed for muscle spasms. 06/21/22   Lanae Crumbly, PA-C  oxyCODONE (OXY IR/ROXICODONE) 5 MG immediate release tablet Take 1 tablet (5 mg total) by mouth every 4 (four) hours as needed for severe pain. Take 1 to 2 tablets every 4 hours for pain. 07/04/22   Wyatt Portela, MD  prochlorperazine (COMPAZINE) 10 MG tablet Take 1 tablet (10 mg total) by mouth every 6 (six) hours as needed for nausea or vomiting. Patient not taking: Reported on 03/21/2022 10/17/21    Wyatt Portela, MD  senna-docusate (SENOKOT-S) 8.6-50 MG tablet Take 1 tablet by mouth 2 (two) times daily. While taking strong pain meds to prevent constipation 04/02/22   Alexis Frock, MD  simvastatin (ZOCOR) 20 MG tablet Take 20 mg by mouth at bedtime.    [provider]  valsartan (DIOVAN) 80 MG tablet Take 80 mg by mouth every evening.    [provider]  vitamin B-12 (CYANOCOBALAMIN) 1000 MCG tablet Take 1,000 mcg by mouth daily.    [provider]  Zoster Vaccine Adjuvanted [facility]) injection Shingrix (PF) 50 mcg/0.5 mL intramuscular suspension, kit  ADM 0.5ML IM UTD    [provider]      Allergies    Atorvastatin and Irbesartan    Review of Systems   Review of Systems  Constitutional:  Negative for chills and fever.  HENT:  Negative for ear pain and sore throat.   Eyes:  Negative for pain and visual disturbance.  Respiratory:  Negative for cough and shortness of breath.   Cardiovascular:  Negative for chest pain and palpitations.  Gastrointestinal:  Positive for rectal pain. Negative for abdominal pain and vomiting.  Genitourinary:  Negative for dysuria and hematuria.  Musculoskeletal:  Positive for back pain. Negative for arthralgias.  Skin:  Negative for color change and rash.  Neurological:  Negative for seizures and syncope.  All other systems reviewed and are negative.   Physical Exam Updated Vital  Signs BP 112/76   Pulse 90   Temp 99 F (37.2 C) (Oral)   Resp 18   Ht 5' 9"  (1.753 m)   Wt 63.5 kg   SpO2 97%   BMI 20.67 kg/m  Physical Exam Vitals and nursing note reviewed.  Constitutional:      General: He is not in acute distress.    Appearance: Normal appearance.  HENT:     Mouth/Throat:     Mouth: Mucous membranes are moist.  Cardiovascular:     Rate and Rhythm: Normal rate and regular rhythm.  Pulmonary:     Effort: Pulmonary effort is normal. No respiratory distress.     Breath sounds: Normal breath sounds.   Abdominal:     General: Abdomen is flat.     Palpations: Abdomen is soft.     Tenderness: There is no abdominal tenderness.  Genitourinary:    Comments: Rectal exam chaperoned by RN Laverle Patter.  Normal rectal tone.  No masses palpated. Musculoskeletal:     Comments: No midline tenderness to C, T, L-spine, significant tenderness over sacrum.  No erythema or wound  Skin:    General: Skin is warm and dry.     Capillary Refill: Capillary refill takes less than 2 seconds.  Neurological:     Mental Status: He is alert and oriented to person, place, and time. Mental status is at baseline.     Sensory: No sensory deficit.     Comments: Strength 5 out of 5 in the bilateral lower extremities with foot dorsi/plantarflexion, knee extension.  5 out of 5 in the right lower extremity with hip flexion.  3-5 strength with hip flexion in the left lower extremity although limited significantly by pain.  Psychiatric:        Mood and Affect: Mood normal.        Behavior: Behavior normal.     ED Results / Procedures / Treatments   Labs (all labs ordered are listed, but only abnormal results are displayed) Labs Reviewed  CBC WITH DIFFERENTIAL/PLATELET - Abnormal; Notable for the following components:      Result Value   WBC 12.2 (*)    RBC 3.32 (*)    Hemoglobin 10.8 (*)    HCT 31.8 (*)    Neutro Abs 8.6 (*)    All other components within normal limits  BASIC METABOLIC PANEL - Abnormal; Notable for the following components:   CO2 18 (*)    Glucose, Bld 124 (*)    BUN 38 (*)    All other components within normal limits  PROTIME-INR    EKG None  Radiology CT ABDOMEN PELVIS W CONTRAST  Result Date: 07/09/2022 CLINICAL DATA:  Bladder carcinoma, evaluate for metastatic disease EXAM: CT ABDOMEN AND PELVIS WITH CONTRAST TECHNIQUE: Multidetector CT imaging of the abdomen and pelvis was performed using the standard protocol following bolus administration of intravenous contrast. RADIATION DOSE REDUCTION:  This exam was performed according to the departmental dose-optimization program which includes automated exposure control, adjustment of the mA and/or kV according to patient size and/or use of iterative reconstruction technique. CONTRAST:  161m OMNIPAQUE IOHEXOL 300 MG/ML  SOLN COMPARISON:  Previous studies including study done on 01/24/2022 FINDINGS: Lower chest: There is interval appearance of multiple nodules of varying sizes in the lower lung fields largest measuring 2.3 cm in size in left lower lobe. There is no pleural effusion. Hepatobiliary: There is interval appearance of multiple low-density lesions of varying sizes in the liver. Largest of the  lesions measures approximally 1.9 cm. There is no dilation of bile ducts. Gallbladder is distended. There is no wall thickening in gallbladder. Pancreas: No focal abnormalities are seen. Spleen: Unremarkable. Adrenals/Urinary Tract: Adrenals are not enlarged. There is no hydronephrosis. Left kidney smaller than right. There are foci of cortical thinning in the kidneys, more so on the left side suggesting scarring from chronic pyelonephritis. There is interval diversion of ureters 2 ileal conduit in the right mid abdomen. Left renal pelvis is prominent measuring 2.2 cm in AP diameter. There is a small 1 mm calculus in the lower pole of right kidney. There are no demonstrable ureteral stones. Urinary bladder is not seen. There are multiple soft tissue nodules of varying sizes in pelvic cavity largest in the right lateral pelvis measuring 4.9 x 4.5 cm. There is 3.6 cm mass in left side of pelvis posterior to the pubic bone. There is 4.2 x 2.2 cm mass in left posterior pelvis. There are other smaller soft tissue nodules in pelvic cavity. Stomach/Bowel: There is fluid density in the lumen of the lower thoracic esophagus, possibly suggesting gastroesophageal reflux. Stomach is unremarkable. Small bowel loops are not dilated. Appendix is not distinctly seen. There is no  pericecal inflammation. There is no significant wall thickening in colon. Vascular/Lymphatic: Vascular structures are unremarkable. Possibility lymphadenopathy in the pelvis is not excluded. Reproductive: Prostate is not seen. Other: There is no ascites or pneumoperitoneum. Musculoskeletal: No displaced fractures are seen. No definite focal lytic or sclerotic lesions are seen. IMPRESSION: There is interval appearance of multiple nodules of varying sizes in both lower lung fields suggesting pulmonary metastatic disease. There is interval appearance of multiple low-density lesions of varying sizes in the liver consistent with hepatic metastatic disease. There is interval appearance of multiple soft tissue masses of varying sizes in pelvic cavity largest measuring 4.9 cm in diameter. This may be due to local recurrence of bladder neoplasm and possibly pelvic lymphadenopathy due to metastatic disease. There is no evidence of intestinal obstruction or pneumoperitoneum. There is no hydronephrosis. Other findings as described in the body of the report. Electronically Signed   By: Elmer Picker M.D.   On: 07/03/2022 14:05     Medications Ordered in ED Medications  acetaminophen (TYLENOL) tablet 650 mg (has no administration in time range)    Or  acetaminophen (TYLENOL) suppository 650 mg (has no administration in time range)  oxyCODONE (Oxy IR/ROXICODONE) immediate release tablet 5 mg (has no administration in time range)  HYDROmorphone (DILAUDID) injection 1 mg (has no administration in time range)  HYDROmorphone (DILAUDID) injection 1 mg (1 mg Intravenous Given 06/27/2022 1310)  iohexol (OMNIPAQUE) 300 MG/ML solution 100 mL (100 mLs Intravenous Contrast Given 07/11/2022 1325)  ketorolac (TORADOL) 15 MG/ML injection 15 mg (15 mg Intravenous Given 06/27/2022 1351)    ED Course/ Medical Decision Making/ A&P Clinical Course as of 07/17/2022 1439  Mon Jul 08, 2022  1337 Discussed with Dr. Sharol Given, who will consult  [WS]  1437 Admitted to hospital under Dr. Olevia Bowens for intractable pain. CT demonstrating worsening metastatic disease. [WS]    Clinical Course User Index [WS] Cristie Hem, MD                           Medical Decision Making Amount and/or Complexity of Data Reviewed Independent Historian: spouse External Data Reviewed: radiology and notes. Labs: ordered. Radiology: ordered.  Risk Prescription drug management. Decision regarding hospitalization.   66 year old male presenting  to the emergency department with sacral pain.  Patient uncomfortable appearing, with significant tenderness over sacrum.  Neurologic exam with intact strength and sensation other than hip flexion on the left lower extremity although significantly limited due to pain.  Normal rectal tone.  Patient presenting with severe pain in the setting of known sacral fracture, with inadequate pain control at home.  Patient's pain unrelieved after multiple doses of fentanyl and Dilaudid.  Will require admission for other management.  Will obtain CT scan to further evaluate fracture or for alternative process.  No physical exam findings or historical elements concerning for acute spinal cord compression or occult infectious process.  Will consult patient's orthopedic surgical group anticipate medicine admission.  I reviewed outside records demonstrating oncology visit and recent imaging.  Final Clinical Impression(s) / ED Diagnoses Final diagnoses:  Pathological fracture with delayed healing, unspecified fracture site, unspecified pathological cause, subsequent encounter  Bladder carcinoma metastatic to lung [facility])  Intractable pain    Rx / DC Orders ED Discharge Orders     None         Cristie Hem, MD 06/29/2022 1438    Cristie Hem, MD 07/09/2022 1439

## 2022-07-08 NOTE — Progress Notes (Signed)
Hematology and Oncology Follow Up Visit  Jerome Cruz [id number] June 14, 1956 66 y.o. 07/02/2022 12:15 PM Jerome Cruz, [name], [name], MD   Principle Diagnosis: 66 year old man with T3N0 high-grade urothelial carcinom of the bladder noted after radical cystectomy in April 2023.  He presented with localized bladder tumor on September 26, 2021.    Prior Therapy:  He is status post TURBT in September 26, 2021.  Final pathology showed infiltrating high-grade papillary urothelial carcinoma that is poorly differentiated and 80% with a glandular component at 5%.   Neoadjuvant chemotherapy utilizing gemcitabine and cisplatin started on October 30, 2021.  He completed 4 cycles of therapy on January 08, 2022.  He is status post robotic laparoscopic complete cystectomy and pelvic lymph node dissection completed on March 27, 2022.  He was found to have T3N0 residual disease.   Current therapy: Under consideration for additional adjuvant therapy.  Interim History: Jerome Cruz returns today for a follow-up visit.  He sustained a fall in the last 4 weeks and has been experiencing lower pelvic and sacral pain that has been exacerbated in the last 2 weeks.  He had multiple imaging studies including MRI of the spine and pelvis.  Lumbar spine MRI on June 14, 2022 did not show any acute pathology.  MRI of the pelvis showed a nondisplaced fracture of the left sacrum with extension of the left SI joint into the left S3 neural foramina.  There was concern about metastatic disease associated with pathological fracture.  He was prescribed oxycodone and has been taking it regularly without any relief.  He has reported pain shooting down his leg and his scrotum.  He did not have any neurological deficits and is able to ambulate without any issues but he cannot function properly and has not been able to sleep.    Medications: Updated on review. Current Outpatient Medications  Medication Sig Dispense Refill   cholecalciferol  (VITAMIN D3) 25 MCG (1000 UNIT) tablet Take 1,000 Units by mouth daily.     esomeprazole (NEXIUM) 20 MG capsule Take 20 mg by mouth daily.     lidocaine-prilocaine (EMLA) cream Apply 1 application topically as needed. (Patient not taking: Reported on 03/21/2022) 30 g 0   methocarbamol (ROBAXIN) 750 MG tablet Take 1 tablet (750 mg total) by mouth every 6 (six) hours as needed for muscle spasms. 40 tablet 0   oxyCODONE (OXY IR/ROXICODONE) 5 MG immediate release tablet Take 1 tablet (5 mg total) by mouth every 4 (four) hours as needed for severe pain. Take 1 to 2 tablets every 4 hours for pain. 90 tablet 0   prochlorperazine (COMPAZINE) 10 MG tablet Take 1 tablet (10 mg total) by mouth every 6 (six) hours as needed for nausea or vomiting. (Patient not taking: Reported on 03/21/2022) 30 tablet 0   senna-docusate (SENOKOT-S) 8.6-50 MG tablet Take 1 tablet by mouth 2 (two) times daily. While taking strong pain meds to prevent constipation 10 tablet 0   simvastatin (ZOCOR) 20 MG tablet Take 20 mg by mouth at bedtime.     valsartan (DIOVAN) 80 MG tablet Take 80 mg by mouth every evening.     vitamin B-12 (CYANOCOBALAMIN) 1000 MCG tablet Take 1,000 mcg by mouth daily.     Zoster Vaccine Adjuvanted [facility]) injection Shingrix (PF) 50 mcg/0.5 mL intramuscular suspension, kit  ADM 0.5ML IM UTD     Current Facility-Administered Medications  Medication Dose Route Frequency Provider Last Rate Last Admin   methylPREDNISolone acetate (DEPO-MEDROL) injection 40 mg  40 mg Intramuscular Once Jerome Crumbly, PA-C       Facility-Administered Medications Ordered in Other Visits  Medication Dose Route Frequency Provider Last Rate Last Admin   gemcitabine (GEMZAR) chemo syringe for bladder instillation 2,000 mg  2,000 mg Bladder Instillation Once Jerome Fries, MD         Allergies:  Allergies  Allergen Reactions   Atorvastatin Other (See Comments)    Pt unsure of reaction    Irbesartan Other (See Comments)     Pt unsure of reaction       Physical Exam: Blood pressure (!) 160/129, pulse 89, temperature 98.1 F (36.7 C), temperature source Temporal, resp. rate 19, height 5' 7"  (1.702 m), SpO2 92 %.    ECOG: 0   General appearance: Uncomfortable appearing gentleman with severe distress. Head: Normocephalic without any trauma Oropharynx: Mucous membranes are moist and pink without any thrush or ulcers. Eyes: Pupils are equal and round reactive to light. Lymph nodes: No cervical, supraclavicular, inguinal or axillary lymphadenopathy.   Heart:regular rate and rhythm.  S1 and S2 without leg edema. Lung: Clear without any rhonchi or wheezes.  No dullness to percussion. Abdomin: Soft, nontender, nondistended with good bowel sounds.  No hepatosplenomegaly. Musculoskeletal: No joint deformity or effusion.  Full range of motion noted. Neurological: No deficits noted on motor, sensory and deep tendon reflex exam. Skin: No petechial rash or dryness.  Appeared moist.            Lab Results: Lab Results  Component Value Date   WBC 8.1 05/30/2022   HGB 10.2 (L) 05/30/2022   HCT 29.9 (L) 05/30/2022   MCV 93.4 05/30/2022   PLT 205 05/30/2022   PSA 0.68 03/06/2012     Chemistry      Component Value Date/Time   NA 141 05/30/2022 1258   K 4.1 05/30/2022 1258   CL 109 05/30/2022 1258   CO2 25 05/30/2022 1258   BUN 25 (H) 05/30/2022 1258   CREATININE 1.12 05/30/2022 1258      Component Value Date/Time   CALCIUM 9.7 05/30/2022 1258   ALKPHOS 43 05/30/2022 1258   AST 15 05/30/2022 1258   ALT 19 05/30/2022 1258   BILITOT 0.2 (L) 05/30/2022 1258          Impression and Plan:  67 year old with:  1.  T3N0 high-grade papillary urothelial carcinoma with residual disease after radical cystectomy.  I recommended updating his staging scan in the immediate future given the concern for possible metastatic disease in the sacrum.  I recommend obtaining CT scan chest abdomen pelvis as  soon as possible.  I recommended immunotherapy which will start in the future once his acute concern is addressed.  2.  Left sacral fracture: This was documented on MRI on July 01, 2022.  Etiology is unclear but is concerned for possible pathological fracture.  He did sustain a fall which could be exacerbated by underlying metastatic disease.  If malignancy is identified palliative radiation therapy is currently under consideration.  He is scheduled to see Dr. Tammi Klippel in the near future.  If he is hospitalized radiation can start sooner.  I recommend orthopedic or neurosurgical evaluation in addition to interventional radiology for possible intervention.   3.  Tractable pain: Related to sacral fracture.  Outpatient regimen has been unsuccessful and I recommended urgent evaluation in the emergency department not only for pain control.  Gabapentin, steroids among others can be used in addition to her narcotic analgesia.  4.  Follow-up: We will arrange a follow-up in the near future after his discharge to discuss systemic therapy.   30  minutes were dedicated to this encounter.  The time was spent on reviewing imaging studies, discussing differential diagnosis and management choices for his presentation.          Zola Button, MD 7/17/202312:15 PM

## 2022-07-08 NOTE — Plan of Care (Signed)

## 2022-07-09 ENCOUNTER — Other Ambulatory Visit: Payer: Self-pay | Admitting: Oncology

## 2022-07-09 ENCOUNTER — Ambulatory Visit
Admit: 2022-07-09 | Discharge: 2022-07-09 | Disposition: A | Discharge status: Home / Self Care | Payer: BC Managed Care – PPO | Attending: Radiation Oncology | Admitting: Radiation Oncology

## 2022-07-09 DIAGNOSIS — R110 Nausea: Secondary | ICD-10-CM | POA: Diagnosis not present

## 2022-07-09 DIAGNOSIS — E43 Unspecified severe protein-calorie malnutrition: Secondary | ICD-10-CM | POA: Diagnosis present

## 2022-07-09 DIAGNOSIS — R52 Pain, unspecified: Secondary | ICD-10-CM | POA: Diagnosis present

## 2022-07-09 DIAGNOSIS — K56609 Unspecified intestinal obstruction, unspecified as to partial versus complete obstruction: Secondary | ICD-10-CM | POA: Diagnosis present

## 2022-07-09 DIAGNOSIS — M79605 Pain in left leg: Secondary | ICD-10-CM

## 2022-07-09 DIAGNOSIS — C7800 Secondary malignant neoplasm of unspecified lung: Secondary | ICD-10-CM | POA: Diagnosis present

## 2022-07-09 DIAGNOSIS — D72829 Elevated white blood cell count, unspecified: Secondary | ICD-10-CM

## 2022-07-09 DIAGNOSIS — A419 Sepsis, unspecified organism: Secondary | ICD-10-CM | POA: Diagnosis present

## 2022-07-09 DIAGNOSIS — C7951 Secondary malignant neoplasm of bone: Secondary | ICD-10-CM | POA: Insufficient documentation

## 2022-07-09 DIAGNOSIS — N179 Acute kidney failure, unspecified: Secondary | ICD-10-CM | POA: Diagnosis present

## 2022-07-09 DIAGNOSIS — I10 Essential (primary) hypertension: Secondary | ICD-10-CM | POA: Diagnosis not present

## 2022-07-09 DIAGNOSIS — K5989 Other specified functional intestinal disorders: Secondary | ICD-10-CM | POA: Diagnosis not present

## 2022-07-09 DIAGNOSIS — M5459 Other low back pain: Secondary | ICD-10-CM | POA: Diagnosis not present

## 2022-07-09 DIAGNOSIS — M545 Low back pain, unspecified: Secondary | ICD-10-CM | POA: Diagnosis not present

## 2022-07-09 DIAGNOSIS — K559 Vascular disorder of intestine, unspecified: Secondary | ICD-10-CM | POA: Diagnosis present

## 2022-07-09 DIAGNOSIS — Z4682 Encounter for fitting and adjustment of non-vascular catheter: Secondary | ICD-10-CM | POA: Diagnosis not present

## 2022-07-09 DIAGNOSIS — M7989 Other specified soft tissue disorders: Secondary | ICD-10-CM | POA: Diagnosis not present

## 2022-07-09 DIAGNOSIS — Z510 Encounter for antineoplastic radiation therapy: Secondary | ICD-10-CM | POA: Diagnosis not present

## 2022-07-09 DIAGNOSIS — D649 Anemia, unspecified: Secondary | ICD-10-CM

## 2022-07-09 DIAGNOSIS — M8458XA Pathological fracture in neoplastic disease, other specified site, initial encounter for fracture: Secondary | ICD-10-CM | POA: Diagnosis present

## 2022-07-09 DIAGNOSIS — E871 Hypo-osmolality and hyponatremia: Secondary | ICD-10-CM | POA: Diagnosis not present

## 2022-07-09 DIAGNOSIS — E785 Hyperlipidemia, unspecified: Secondary | ICD-10-CM | POA: Diagnosis present

## 2022-07-09 DIAGNOSIS — C679 Malignant neoplasm of bladder, unspecified: Secondary | ICD-10-CM

## 2022-07-09 DIAGNOSIS — Z515 Encounter for palliative care: Secondary | ICD-10-CM | POA: Diagnosis not present

## 2022-07-09 DIAGNOSIS — E872 Acidosis, unspecified: Secondary | ICD-10-CM | POA: Diagnosis present

## 2022-07-09 DIAGNOSIS — Z66 Do not resuscitate: Secondary | ICD-10-CM | POA: Diagnosis not present

## 2022-07-09 DIAGNOSIS — Z6820 Body mass index (BMI) 20.0-20.9, adult: Secondary | ICD-10-CM | POA: Diagnosis not present

## 2022-07-09 DIAGNOSIS — R6521 Severe sepsis with septic shock: Secondary | ICD-10-CM | POA: Diagnosis present

## 2022-07-09 DIAGNOSIS — K5669 Other partial intestinal obstruction: Secondary | ICD-10-CM | POA: Diagnosis not present

## 2022-07-09 DIAGNOSIS — K219 Gastro-esophageal reflux disease without esophagitis: Secondary | ICD-10-CM | POA: Diagnosis present

## 2022-07-09 DIAGNOSIS — D630 Anemia in neoplastic disease: Secondary | ICD-10-CM | POA: Diagnosis present

## 2022-07-09 DIAGNOSIS — C787 Secondary malignant neoplasm of liver and intrahepatic bile duct: Secondary | ICD-10-CM | POA: Diagnosis not present

## 2022-07-09 DIAGNOSIS — K659 Peritonitis, unspecified: Secondary | ICD-10-CM | POA: Diagnosis present

## 2022-07-09 DIAGNOSIS — D72819 Decreased white blood cell count, unspecified: Secondary | ICD-10-CM | POA: Diagnosis not present

## 2022-07-09 DIAGNOSIS — K921 Melena: Secondary | ICD-10-CM | POA: Diagnosis not present

## 2022-07-09 DIAGNOSIS — D62 Acute posthemorrhagic anemia: Secondary | ICD-10-CM | POA: Diagnosis not present

## 2022-07-09 DIAGNOSIS — K567 Ileus, unspecified: Secondary | ICD-10-CM | POA: Diagnosis present

## 2022-07-09 LAB — HIV ANTIBODY (ROUTINE TESTING W REFLEX): HIV Screen 4th Generation wRfx: NONREACTIVE

## 2022-07-09 MED ORDER — HYDROMORPHONE HCL 1 MG/ML IJ SOLN
1.0000 mg | Freq: Once | INTRAMUSCULAR | Status: AC | PRN
  Administered 2022-07-10: 1 mg via INTRAVENOUS
  Filled 2022-07-09: qty 1

## 2022-07-09 MED ORDER — ADULT MULTIVITAMIN W/MINERALS CH
1.0000 | ORAL_TABLET | Freq: Every day | ORAL | Status: DC
  Administered 2022-07-09 – 2022-07-12 (×4): 1 via ORAL
  Filled 2022-07-09 (×5): qty 1

## 2022-07-09 MED ORDER — ALUM & MAG HYDROXIDE-SIMETH 200-200-20 MG/5ML PO SUSP
15.0000 mL | Freq: Four times a day (QID) | ORAL | Status: DC | PRN
  Administered 2022-07-09 – 2022-07-12 (×3): 15 mL via ORAL
  Filled 2022-07-09 (×3): qty 30

## 2022-07-09 MED ORDER — LORAZEPAM 1 MG PO TABS
1.0000 mg | ORAL_TABLET | Freq: Once | ORAL | Status: AC
  Administered 2022-07-09: 1 mg via ORAL
  Filled 2022-07-09: qty 1

## 2022-07-09 MED ORDER — ALPRAZOLAM 0.25 MG PO TABS
0.2500 mg | ORAL_TABLET | Freq: Three times a day (TID) | ORAL | Status: DC | PRN
Start: 2022-07-09 — End: 2022-07-10
  Administered 2022-07-09 – 2022-07-10 (×3): 0.25 mg via ORAL
  Filled 2022-07-09 (×4): qty 1

## 2022-07-09 MED ORDER — LIDOCAINE 5 % EX PTCH
1.0000 | MEDICATED_PATCH | CUTANEOUS | Status: DC
  Administered 2022-07-10: 1 via TRANSDERMAL
  Filled 2022-07-09: qty 1

## 2022-07-09 MED ORDER — HYDROMORPHONE HCL 1 MG/ML IJ SOLN
1.0000 mg | INTRAMUSCULAR | Status: DC | PRN
  Administered 2022-07-09 – 2022-07-13 (×9): 1 mg via INTRAVENOUS
  Filled 2022-07-09 (×10): qty 1

## 2022-07-09 MED ORDER — PROCHLORPERAZINE EDISYLATE 10 MG/2ML IJ SOLN
INTRAMUSCULAR | Status: AC
  Filled 2022-07-09: qty 2

## 2022-07-09 NOTE — Consult Note (Signed)
[place] Nurse ostomy consult note Stoma type/location: RUQ urostomy. Patient known to our team from ileal conduit surgery in April of this year.He is independent in his care. Stomal assessment/size: Not measured today Treatment options for stomal/peristomal skin: Skin barrier ring and convex pouch Ostomy pouching:  1-piece convex pouch with skin barrier ring.  Pouch is Kellie Simmering # [id number]. Skin barrier ring is Kellie Simmering # [id number].  Adapter for bedside urinary drainage bag is Kellie Simmering # [id number].  Nursing is provided with guidance to order 5 Pouches, 5 rings to bedside and 2 adapters to bedside.  Enrolled patient in [place] program: Yes, previously  [place] nursing team will not follow, but will remain available to this patient, the nursing and medical teams.  Please re-consult if needed.  Thank you for inviting [place] to participate in this patient's Plan of Care.  Maudie Flakes, MSN, RN, CNS, [place], Serita Grammes, [employer], [employer] phone:  [phone]

## 2022-07-09 NOTE — Progress Notes (Signed)
PROGRESS NOTE    Jerome Cruz  VZC:[id number] DOB: [birth year] DOA: 07/05/2022 PCP: [name], MD   Brief Narrative:  66 y.o. male with medical history significant of bladder adenocarcinoma with a history of prostatectomy, cystectomy with ileal conduit, ADD, GERD, hyperlipidemia, hypertension who was referred from the cancer center due to persistent lower back pain despite taking oxycodone after having a fall last month that resulted in pathological sacral fracture.  On presentation, CT of abdomen and pelvis with contrast showed interval appearance of multiple nodules in both lungs as well as liver along with multiple new soft tissue masses in the pelvic cavity.  Orthopedic/oncology were consulted.  Assessment & Plan:   Intractable low back pain Probable pathological sacral fractures -Presented with intractable low back pain possibly from pathological sacral fractures and not responding to oral pain medications at home.  Still in significant pain requiring scheduled ketorolac along with as needed IV and oral opiates.  Continue gabapentin.  Continue bowel regimen -Orthopedic evaluation pending.  ED provider had spoken to Dr. Sharol Given on admission -Might need radiation oncology evaluation  Metastatic bladder cancer -With history of prostatectomy, cystectomy with ileal conduit and chemotherapy -Imaging on presentation revealed multiple areas of metastases as above.  Oncology following.  We will request palliative care consultation for goals of care discussion and pain management as well  Leukocytosis -Possibly reactive.  Repeat a.m. labs  Anemia of chronic disease -Hemoglobin stable.  No signs of bleeding.  Essential hypertension--continue valsartan  Hyperlipidemia -Continue simvastatin  GERD -Continue PPI   DVT prophylaxis: Start Lovenox Code Status: Full Family Communication: None at bedside Disposition Plan: Status is: Observation The patient will require care spanning > 2  midnights and should be moved to inpatient because: Of severity of illness.  Still requiring significant amount of pain medications including IV    Consultants: Oncology.  Orthopedics evaluation pending; consult palliative care.  Procedures: None  Antimicrobials: None   Subjective: Patient seen and examined at bedside.  Still complains of severe intermittent low back pain.  Has not slept well.  No overnight fever, vomiting, chest pain reported.  Objective: Vitals:   07/01/2022 1714 07/17/2022 2100 07/09/22 0059 07/09/22 0438  BP: 132/83 129/74 (!) 141/89 120/73  Pulse: 89 91 97 92  Resp: '17 16 18 16  '$ Temp: 98 F (36.7 C) 98.7 F (37.1 C) 97.6 F (36.4 C) 97.9 F (36.6 C)  TempSrc: Oral Oral Oral Oral  SpO2: 100% 98% 97% 97%  Weight:      Height:        Intake/Output Summary (Last 24 hours) at 07/09/2022 1109 Last data filed at 07/09/2022 0458 Gross per 24 hour  Intake 240 ml  Output 500 ml  Net -260 ml   Filed Weights   07/17/2022 1234  Weight: 63.5 kg    Examination:  General exam: Appears calm and comfortable.  Looks chronically ill and deconditioned.  Currently on room air. Respiratory system: Bilateral decreased breath sounds at bases with some scattered crackles Cardiovascular system: S1 & S2 heard, Rate controlled Gastrointestinal system: Abdomen is slightly distended, soft and nontender. Normal bowel sounds heard.  Ostomy present. Extremities: No cyanosis, clubbing, edema  Central nervous system: Alert and oriented.  Slow to respond.  No focal neurological deficits. Moving extremities Skin: No rashes, lesions or ulcers Psychiatry: Affect is mostly flat.  No signs of agitation.  Data Reviewed: I have personally reviewed following labs and imaging studies  CBC: Recent Labs  Lab 07/05/2022 1251  WBC  12.2*  NEUTROABS 8.6*  HGB 10.8*  HCT 31.8*  MCV 95.8  PLT 449   Basic Metabolic Panel: Recent Labs  Lab 07/06/2022 1251  NA 137  K 4.1  CL 104  CO2 18*   GLUCOSE 124*  BUN 38*  CREATININE 1.19  CALCIUM 9.7   GFR: Estimated Creatinine Clearance: 57.1 mL/min (by C-G formula based on SCr of 1.19 mg/dL). Liver Function Tests: No results for input(s): "AST", "ALT", "ALKPHOS", "BILITOT", "PROT", "ALBUMIN" in the last 168 hours. No results for input(s): "LIPASE", "AMYLASE" in the last 168 hours. No results for input(s): "AMMONIA" in the last 168 hours. Coagulation Profile: Recent Labs  Lab 06/25/2022 1251  INR 1.1   Cardiac Enzymes: No results for input(s): "CKTOTAL", "CKMB", "CKMBINDEX", "TROPONINI" in the last 168 hours. BNP (last 3 results) No results for input(s): "PROBNP" in the last 8760 hours. HbA1C: No results for input(s): "HGBA1C" in the last 72 hours. CBG: No results for input(s): "GLUCAP" in the last 168 hours. Lipid Profile: No results for input(s): "CHOL", "HDL", "LDLCALC", "TRIG", "CHOLHDL", "LDLDIRECT" in the last 72 hours. Thyroid Function Tests: No results for input(s): "TSH", "T4TOTAL", "FREET4", "T3FREE", "THYROIDAB" in the last 72 hours. Anemia Panel: No results for input(s): "VITAMINB12", "FOLATE", "FERRITIN", "TIBC", "IRON", "RETICCTPCT" in the last 72 hours. Sepsis Labs: No results for input(s): "PROCALCITON", "LATICACIDVEN" in the last 168 hours.  No results found for this or any previous visit (from the past 240 hour(s)).       Radiology Studies: CT ABDOMEN PELVIS W CONTRAST  Result Date: 07/05/2022 CLINICAL DATA:  Bladder carcinoma, evaluate for metastatic disease EXAM: CT ABDOMEN AND PELVIS WITH CONTRAST TECHNIQUE: Multidetector CT imaging of the abdomen and pelvis was performed using the standard protocol following bolus administration of intravenous contrast. RADIATION DOSE REDUCTION: This exam was performed according to the departmental dose-optimization program which includes automated exposure control, adjustment of the mA and/or kV according to patient size and/or use of iterative reconstruction  technique. CONTRAST:  178m OMNIPAQUE IOHEXOL 300 MG/ML  SOLN COMPARISON:  Previous studies including study done on 01/24/2022 FINDINGS: Lower chest: There is interval appearance of multiple nodules of varying sizes in the lower lung fields largest measuring 2.3 cm in size in left lower lobe. There is no pleural effusion. Hepatobiliary: There is interval appearance of multiple low-density lesions of varying sizes in the liver. Largest of the lesions measures approximally 1.9 cm. There is no dilation of bile ducts. Gallbladder is distended. There is no wall thickening in gallbladder. Pancreas: No focal abnormalities are seen. Spleen: Unremarkable. Adrenals/Urinary Tract: Adrenals are not enlarged. There is no hydronephrosis. Left kidney smaller than right. There are foci of cortical thinning in the kidneys, more so on the left side suggesting scarring from chronic pyelonephritis. There is interval diversion of ureters 2 ileal conduit in the right mid abdomen. Left renal pelvis is prominent measuring 2.2 cm in AP diameter. There is a small 1 mm calculus in the lower pole of right kidney. There are no demonstrable ureteral stones. Urinary bladder is not seen. There are multiple soft tissue nodules of varying sizes in pelvic cavity largest in the right lateral pelvis measuring 4.9 x 4.5 cm. There is 3.6 cm mass in left side of pelvis posterior to the pubic bone. There is 4.2 x 2.2 cm mass in left posterior pelvis. There are other smaller soft tissue nodules in pelvic cavity. Stomach/Bowel: There is fluid density in the lumen of the lower thoracic esophagus, possibly suggesting gastroesophageal reflux.  Stomach is unremarkable. Small bowel loops are not dilated. Appendix is not distinctly seen. There is no pericecal inflammation. There is no significant wall thickening in colon. Vascular/Lymphatic: Vascular structures are unremarkable. Possibility lymphadenopathy in the pelvis is not excluded. Reproductive: Prostate is  not seen. Other: There is no ascites or pneumoperitoneum. Musculoskeletal: No displaced fractures are seen. No definite focal lytic or sclerotic lesions are seen. IMPRESSION: There is interval appearance of multiple nodules of varying sizes in both lower lung fields suggesting pulmonary metastatic disease. There is interval appearance of multiple low-density lesions of varying sizes in the liver consistent with hepatic metastatic disease. There is interval appearance of multiple soft tissue masses of varying sizes in pelvic cavity largest measuring 4.9 cm in diameter. This may be due to local recurrence of bladder neoplasm and possibly pelvic lymphadenopathy due to metastatic disease. There is no evidence of intestinal obstruction or pneumoperitoneum. There is no hydronephrosis. Other findings as described in the body of the report. Electronically Signed   By: Elmer Picker M.D.   On: 06/24/2022 14:05        Scheduled Meds:  gabapentin  300 mg Oral BID   ketorolac  15 mg Intravenous Q6H   magnesium hydroxide  15 mL Oral QHS   pantoprazole  40 mg Oral Daily   senna-docusate  1 tablet Oral BID   simvastatin  20 mg Oral Daily   valsartan  80 mg Oral Daily   Continuous Infusions:        Aline August, MD [facility] 07/09/2022, 11:09 AM

## 2022-07-09 NOTE — Progress Notes (Signed)
Radiation Oncology         [phone] ________________________________  Initial inpatient Consultation  Name: Jerome Cruz MRN: [id number]  Date of Service: 07/12/2022 DOB: 01-16-1956  YQ:[id number], [name], MD  No ref. provider found   REFERRING PHYSICIAN: Zola Button, MD  DIAGNOSIS: 66 y.o. gentleman with painful sacral metastasis from stage IV bladder cancer.    ICD-10-CM   1. Pathological fracture with delayed healing, unspecified fracture site, unspecified pathological cause, subsequent encounter  M84.40XG     2. 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 Intractable pain  R52       HISTORY OF PRESENT ILLNESS: Jerome Cruz is a 66 y.o. male seen at the request of Dr. Alen Blew.  He presented with localized bladder tumor on September 26, 2021.  He got Neoadjuvant chemotherapy utilizing gemcitabine and cisplatin started on October 30, 2021.  He completed 4 cycles of therapy on January 08, 2022.  He underwent robotic laparoscopic complete cystectomy and pelvic lymph node dissection completed on March 27, 2022.  He was found to have T3N0 residual disease.  Final pathology showed infiltrating high-grade papillary urothelial carcinoma that is poorly differentiated and 80% with a glandular component at 5%. He sustained a fall in the last 4 weeks and has been experiencing lower pelvic and sacral pain that has been exacerbated in the last 2 weeks.  He had multiple imaging studies including MRI of the spine and pelvis.  Lumbar spine MRI on June 14, 2022 did not show any acute pathology.  MRI of the pelvis showed a nondisplaced fracture of the left sacrum with extension of the left SI joint into the left S3 neural foramina.  There was concern about metastatic disease associated with pathological fracture.   He was prescribed oxycodone and has been taking it regularly without any relief.  He has reported pain shooting down his leg and his scrotum.  He did not have any  neurological deficits and is able to ambulate without any issues but he cannot function properly and has not been able to sleep. The patient was admitted last night for pain.    PREVIOUS RADIATION THERAPY: No  PAST MEDICAL HISTORY:  Past Medical History:  Diagnosis Date   Bladder tumor 09/26/2021   ED (erectile dysfunction)    GERD (gastroesophageal reflux disease)    Hyperlipidemia    Hypertension       PAST SURGICAL HISTORY: Past Surgical History:  Procedure Laterality Date   ANKLE SURGERY Right    COLONOSCOPY  2012   COLONOSCOPY  03/27/2018   CYSTOSCOPY WITH INJECTION N/A 03/27/2022   Procedure: CYSTOSCOPY WITH INJECTION OF INDOCYANINE GREEN DYE;  Surgeon: Alexis Frock, MD;  Location: [facility];  Service: Urology;  Laterality: N/A;   CYSTOSCOPY WITH URETHRAL DILATATION N/A 10/02/2021   Procedure: CYSTOSCOPY WITH URETHRAL BALLOON DILATATION;  Surgeon: Robley Fries, MD;  Location: [place];  Service: Urology;  Laterality: N/A;   IR IMAGING GUIDED PORT INSERTION  10/24/2021   LYMPH NODE DISSECTION Bilateral 03/27/2022   Procedure: PELVIC LYMPH NODE DISSECTION;  Surgeon: Alexis Frock, MD;  Location: [facility];  Service: Urology;  Laterality: Bilateral;   ROBOT ASSISTED LAPAROSCOPIC COMPLETE CYSTECT ILEAL CONDUIT N/A 03/27/2022   Procedure: XI ROBOTIC ASSISTED LAPAROSCOPIC COMPLETE CYSTECT ILEAL CONDUIT;  Surgeon: Alexis Frock, MD;  Location: [facility];  Service: Urology;  Laterality: N/A;  6 HRS   ROBOT ASSISTED LAPAROSCOPIC RADICAL PROSTATECTOMY N/A 03/27/2022  Procedure: XI ROBOTIC ASSISTED LAPAROSCOPIC RADICAL PROSTATECTOMY;  Surgeon: Alexis Frock, MD;  Location: [facility];  Service: Urology;  Laterality: N/A;   TRANSURETHRAL RESECTION OF BLADDER TUMOR WITH MITOMYCIN-C N/A 10/02/2021   Procedure: TRANSURETHRAL RESECTION OF BLADDER TUMOR WITH GEMCITABINE;  Surgeon: Robley Fries, MD;  Location: [place];  Service: Urology;  Laterality: N/A;  28  MINS    FAMILY HISTORY:  Family History  Problem Relation Age of Onset   Diabetes Mother    Hypertension Mother    Cancer Father        type unknown   Diabetes Mellitus I Son        since age 64   Colon cancer Neg Hx    Rectal cancer Neg Hx    Stomach cancer Neg Hx    Esophageal cancer Neg Hx     SOCIAL HISTORY:  Social History   Socioeconomic History   Marital status: Married    Spouse name: Not on file   Number of children: 1   Years of education: Not on file   Highest education level: Not on file  Occupational History   Occupation: self employed-grading / concrete  Tobacco Use   Smoking status: Former    Packs/day: 1.00    Years: 15.00    Total pack years: 15.00    Types: Cigarettes    Quit date: 08/23/2001    Years since quitting: 20.8   Smokeless tobacco: Never  Vaping Use   Vaping Use: Never used  Substance and Sexual Activity   Alcohol use: Yes    Comment: occasional beer   Drug use: No   Sexual activity: Not on file  Other Topics Concern   Not on file  Social History Narrative   The patient is married with 1 son   He is a self-employed Psychiatrist, he also has livestock and greenhouses   No tobacco or drug use, occasional beer   Social Determinants of Radio broadcast assistant Strain: Not on file  Food Insecurity: Not on file  Transportation Needs: Not on file  Physical Activity: Not on file  Stress: Not on file  Social Connections: Not on file  Intimate Partner Violence: Not on file    ALLERGIES: Atorvastatin and Irbesartan  MEDICATIONS:  Current Facility-Administered Medications  Medication Dose Route Frequency Provider Last Rate Last Admin   acetaminophen (TYLENOL) tablet 650 mg  650 mg Oral Q6H PRN Reubin Milan, MD       Or   acetaminophen (TYLENOL) suppository 650 mg  650 mg Rectal Q6H PRN Reubin Milan, MD       gabapentin (NEURONTIN) capsule 300 mg  300 mg Oral BID Reubin Milan, MD   300 mg at  07/17/2022 2101   HYDROmorphone (DILAUDID) injection 1 mg  1 mg Intravenous Q3H PRN Reubin Milan, MD   1 mg at 07/09/22 0539   ketorolac (TORADOL) 15 MG/ML injection 15 mg  15 mg Intravenous Q6H Reubin Milan, MD   15 mg at 07/09/22 0749   magnesium hydroxide (MILK OF MAGNESIA) suspension 15 mL  15 mL Oral QHS Reubin Milan, MD       oxyCODONE (Oxy IR/ROXICODONE) immediate release tablet 10 mg  10 mg Oral Q4H PRN Reubin Milan, MD   10 mg at 07/09/22 0749   pantoprazole (PROTONIX) EC tablet 40 mg  40 mg Oral Daily Reubin Milan, MD       prochlorperazine (  COMPAZINE) injection 5 mg  5 mg Intravenous Q6H PRN Kathryne Eriksson, NP   5 mg at 07/09/22 0009   senna-docusate (Senokot-S) tablet 1 tablet  1 tablet Oral BID Reubin Milan, MD   1 tablet at 06/28/2022 2101   simvastatin (ZOCOR) tablet 20 mg  20 mg Oral Daily Reubin Milan, MD       valsartan (DIOVAN) 80 MG tablet 80 mg  80 mg Oral Daily Reubin Milan, MD        REVIEW OF SYSTEMS:  On review of systems, the patient reports back pain below the belt line and pain in the scrotum and leg.  A complete review of systems is obtained and is otherwise negative.    PHYSICAL EXAM:  Wt Readings from Last 3 Encounters:  06/28/2022 140 lb (63.5 kg)  06/21/22 157 lb (71.2 kg)  05/30/22 157 lb (71.2 kg)   Temp Readings from Last 3 Encounters:  07/09/22 97.9 F (36.6 C) (Oral)  07/05/2022 98.1 F (36.7 C) (Temporal)  05/30/22 (!) 97.5 F (36.4 C) (Tympanic)   BP Readings from Last 3 Encounters:  07/09/22 120/73  07/03/2022 (!) 160/129  06/26/22 97/70   Pulse Readings from Last 3 Encounters:  07/09/22 92  06/26/2022 89  06/26/22 (!) 105   Pain Assessment Pain Score: 8 /10  In general this is a well appearing gentleman in no acute distress. He is alert and oriented x4 and appropriate throughout the examination. HEENT reveals that the patient is normocephalic, atraumatic. EOMs are intact.   KPS =  80  100 - Normal; no complaints; no evidence of disease. 90   - Able to carry on normal activity; minor signs or symptoms of disease. 80   - Normal activity with effort; some signs or symptoms of disease. 71   - Cares for self; unable to carry on normal activity or to do active work. 60   - Requires occasional assistance, but is able to care for most of his personal needs. 50   - Requires considerable assistance and frequent medical care. 85   - Disabled; requires special care and assistance. 57   - Severely disabled; hospital admission is indicated although death not imminent. 47   - Very sick; hospital admission necessary; active supportive treatment necessary. 10   - Moribund; fatal processes progressing rapidly. 0     - Dead  Karnofsky DA, Abelmann Shelby, Craver LS and [facility] [phone]) The use of the nitrogen mustards in the palliative treatment of carcinoma: with particular reference to bronchogenic carcinoma Cancer 1 634-56  LABORATORY DATA:  Lab Results  Component Value Date   WBC 12.2 (H) 07/07/2022   HGB 10.8 (L) 06/27/2022   HCT 31.8 (L) 06/24/2022   MCV 95.8 07/17/2022   PLT 281 06/25/2022   Lab Results  Component Value Date   NA 137 07/12/2022   K 4.1 06/22/2022   CL 104 06/23/2022   CO2 18 (L) 07/04/2022   Lab Results  Component Value Date   ALT 19 05/30/2022   AST 15 05/30/2022   ALKPHOS 43 05/30/2022   BILITOT 0.2 (L) 05/30/2022     RADIOGRAPHY: CT ABDOMEN PELVIS W CONTRAST  Result Date: 07/09/2022 CLINICAL DATA:  Bladder carcinoma, evaluate for metastatic disease EXAM: CT ABDOMEN AND PELVIS WITH CONTRAST TECHNIQUE: Multidetector CT imaging of the abdomen and pelvis was performed using the standard protocol following bolus administration of intravenous contrast. RADIATION DOSE REDUCTION: This exam was performed according to the departmental  dose-optimization program which includes automated exposure control, adjustment of the mA and/or kV according to patient  size and/or use of iterative reconstruction technique. CONTRAST:  147m OMNIPAQUE IOHEXOL 300 MG/ML  SOLN COMPARISON:  Previous studies including study done on 01/24/2022 FINDINGS: Lower chest: There is interval appearance of multiple nodules of varying sizes in the lower lung fields largest measuring 2.3 cm in size in left lower lobe. There is no pleural effusion. Hepatobiliary: There is interval appearance of multiple low-density lesions of varying sizes in the liver. Largest of the lesions measures approximally 1.9 cm. There is no dilation of bile ducts. Gallbladder is distended. There is no wall thickening in gallbladder. Pancreas: No focal abnormalities are seen. Spleen: Unremarkable. Adrenals/Urinary Tract: Adrenals are not enlarged. There is no hydronephrosis. Left kidney smaller than right. There are foci of cortical thinning in the kidneys, more so on the left side suggesting scarring from chronic pyelonephritis. There is interval diversion of ureters 2 ileal conduit in the right mid abdomen. Left renal pelvis is prominent measuring 2.2 cm in AP diameter. There is a small 1 mm calculus in the lower pole of right kidney. There are no demonstrable ureteral stones. Urinary bladder is not seen. There are multiple soft tissue nodules of varying sizes in pelvic cavity largest in the right lateral pelvis measuring 4.9 x 4.5 cm. There is 3.6 cm mass in left side of pelvis posterior to the pubic bone. There is 4.2 x 2.2 cm mass in left posterior pelvis. There are other smaller soft tissue nodules in pelvic cavity. Stomach/Bowel: There is fluid density in the lumen of the lower thoracic esophagus, possibly suggesting gastroesophageal reflux. Stomach is unremarkable. Small bowel loops are not dilated. Appendix is not distinctly seen. There is no pericecal inflammation. There is no significant wall thickening in colon. Vascular/Lymphatic: Vascular structures are unremarkable. Possibility lymphadenopathy in the pelvis  is not excluded. Reproductive: Prostate is not seen. Other: There is no ascites or pneumoperitoneum. Musculoskeletal: No displaced fractures are seen. No definite focal lytic or sclerotic lesions are seen. IMPRESSION: There is interval appearance of multiple nodules of varying sizes in both lower lung fields suggesting pulmonary metastatic disease. There is interval appearance of multiple low-density lesions of varying sizes in the liver consistent with hepatic metastatic disease. There is interval appearance of multiple soft tissue masses of varying sizes in pelvic cavity largest measuring 4.9 cm in diameter. This may be due to local recurrence of bladder neoplasm and possibly pelvic lymphadenopathy due to metastatic disease. There is no evidence of intestinal obstruction or pneumoperitoneum. There is no hydronephrosis. Other findings as described in the body of the report. Electronically Signed   By: PElmer PickerM.D.   On: 06/30/2022 14:05   MR PELVIS W WO CONTRAST  Addendum Date: 07/01/2022   ADDENDUM REPORT: 07/01/2022 12:03 ADDENDUM: Findings were discussed with MCrist Infante MD on 07/01/2022 at 11:57 am. Electronically Signed   By: JMaurine SimmeringM.D.   On: 07/01/2022 12:03   Result Date: 07/01/2022 CLINICAL DATA:  Right lower back pain radiating to bilateral lower leg and groin area, history of bladder cancer EXAM: MRI PELVIS WITHOUT AND WITH CONTRAST TECHNIQUE: Multiplanar multisequence MR imaging of the pelvis was performed both before and after administration of intravenous contrast. CONTRAST:  135mMULTIHANCE GADOBENATE DIMEGLUMINE 529 MG/ML IV SOLN COMPARISON:  MRI 06/14/2022, CT 01/24/2022. FINDINGS: Urinary Tract:  Prior cystectomy. Bowel: Partially visualized exterior right abdominal wall pouch presumably related to ileo conduit/ostomy. No dilated bowel loops to  suggest a bowel obstruction. Vascular/Lymphatic: No lymphadenopathy. No significant vascular findings present. Reproductive:  The  prostate is surgically absent. Other:  None Musculoskeletal: Intense marrow edema with surrounding linear low T1 and T2 signal within the left sacrum at S3, primarily in zone 1/2, with extension to the SI joint and the left S3 neural foramen. Marrow edema extends into the S2 and S4 segments. There is a rim of enhancement the central non-enhancement in this region. Degree of marrow edema has increased since the prior exam. There is reactive intramuscular edema in the adjacent piriformis muscle. There is reactive edema within the bilateral hip adductors. Previously seen edema signal within the pubic bodies with likely artifactual. There is intense marrow edema with enhancement at the right puboacetabular junction (series 7, image 34), markedly increased from prior exam when there is a small focus of edema, much less conspicuous. The bilateral gluteal tendons are intact. Proximal hamstrings are intact. IMPRESSION: Nondisplaced fracture of the left sacrum at S3, zone 1/2 with extension to the left SI joint and likely into the left S3 neural foramen. Intense associated marrow edema with confluent low T1 signal and surrounding rim of enhancement. MRI characteristics are concerning for a possible metastasis with associated pathologic fracture. Additional intense marrow edema and enhancement with confluent low T1 signal at the right puboacetabular junction, increased from the prior exam, with potential small fracture line. This is also concerning for a possible metastasis with associated pathologic fracture. Recommend continued oncologic surveillance, with consideration of direct tissue sampling of the left sacrum. CT of the pelvis would also be useful for further evaluation bony detail and potential procedural planning if indicated. Electronically Signed: By: Maurine Simmering M.D. On: 07/01/2022 10:57   DG NERVE ROOT BLOCK LUMBAR SACRAL  Result Date: 06/26/2022 CLINICAL DATA:  The patient did not get any significant relief  from the epidural injection dated 06/05/2022. He presents today with severe bilateral leg pain and groin pain, much worse on the right than the left. EXAM: Bilateral L2 nerve root block and transforaminal epidural COMPARISON:  Prior injection images [id number]. MRI lumbar and limited pelvis 06/14/2022. TECHNIQUE: The overlying skin was prepped with Betadine and draped in sterile fashion. Skin anesthesia was carried out using 1% Lidocaine. A curved 22 gauge spinal needle was directed into the superior ventral neural foramen on the left at L2-3 and subsequently on the right at L2-3. Injection of a few drops of Isovue 200 demonstrates spread outlining the left nerve root. No intravascular extension. 40 mg Depo-Medrol mixed with a few drops of 1% lidocaine for placed on the left, and 80 mg Depo-Medrol and 1 cc 1% lidocaine were placed on the right. No apparent complication. The patient tolerated the procedure without difficulty and was transferred to recovery in excellent condition. 15 minutes following the injection, he was feeling considerable relief of his pain. FLUOROSCOPY: 1 minute 14 seconds.  47.84 micro gray meter squared IMPRESSION: Technically successful bilateral L2 selective nerve root block and transforaminal epidural steroid injection. 40 mg placed on the left. 80 mg placed on the right. At the time of discharge, the patient was feeling considerable relief. The expected time courses of the anesthetic and steroid were discussed. On reviewing the imaging, I am somewhat concerned about the appearance of the left sacral abnormality. This does not appear entirely typical of a fracture to my evaluation. The patient has a prior history of bladder cancer. I discussed this with Dr. Joylene Draft and there is consideration of a repeat sacral MRI  with and without contrast. Electronically Signed   By: Nelson Chimes M.D.   On: 06/26/2022 11:18   DG NERVE ROOT BLOCK LUMBAR SACRAL  Result Date: 06/26/2022 CLINICAL DATA:  The  patient did not get any significant relief from the epidural injection dated 06/05/2022. He presents today with severe bilateral leg pain and groin pain, much worse on the right than the left. EXAM: Bilateral L2 nerve root block and transforaminal epidural COMPARISON:  Prior injection images [id number]. MRI lumbar and limited pelvis 06/14/2022. TECHNIQUE: The overlying skin was prepped with Betadine and draped in sterile fashion. Skin anesthesia was carried out using 1% Lidocaine. A curved 22 gauge spinal needle was directed into the superior ventral neural foramen on the left at L2-3 and subsequently on the right at L2-3. Injection of a few drops of Isovue 200 demonstrates spread outlining the left nerve root. No intravascular extension. 40 mg Depo-Medrol mixed with a few drops of 1% lidocaine for placed on the left, and 80 mg Depo-Medrol and 1 cc 1% lidocaine were placed on the right. No apparent complication. The patient tolerated the procedure without difficulty and was transferred to recovery in excellent condition. 15 minutes following the injection, he was feeling considerable relief of his pain. FLUOROSCOPY: 1 minute 14 seconds.  47.84 micro gray meter squared IMPRESSION: Technically successful bilateral L2 selective nerve root block and transforaminal epidural steroid injection. 40 mg placed on the left. 80 mg placed on the right. At the time of discharge, the patient was feeling considerable relief. The expected time courses of the anesthetic and steroid were discussed. On reviewing the imaging, I am somewhat concerned about the appearance of the left sacral abnormality. This does not appear entirely typical of a fracture to my evaluation. The patient has a prior history of bladder cancer. I discussed this with Dr. Joylene Draft and there is consideration of a repeat sacral MRI with and without contrast. Electronically Signed   By: Nelson Chimes M.D.   On: 06/26/2022 11:18   MR PELVIS WO CONTRAST  Result Date:  06/15/2022 CLINICAL DATA:  Lower back pain since bladder surgery 04/11/2022. Diagnosis of bladder cancer 08/2021. Increased pain after fell 05/31/2022. Prior MRI of the lumbar spine 05/28/2022. rectal pain. EXAM: MRI PELVIS WITHOUT CONTRAST TECHNIQUE: Multiplanar multisequence MR imaging of the pelvis was performed. No intravenous contrast was administered. The patient was unable to complete this exam due to extreme pain. Axial T2 fat saturation and axial T1 images and coronal T1 images were performed. COMPARISON:  MRI lumbar spine 05/28/2022; CT chest abdomen and pelvis 01/24/2022 FINDINGS: Axial images cover the bilateral sacroiliac joints but not the complete pelvis. Coronal images also cover the sacrum only. Patient motion artifact moderately limits evaluation. Urinary Tract: The urinary bladder is not visualized presumably due to prior bladder surgery. Bowel: No dilated loops of bowel to indicate bowel obstruction. Partial visualization of presumably an exterior right abdominal wall pouch possibly related to post cystectomy ileal conduit/ostomy. Vascular/Lymphatic: Visualized common iliac and internal external iliac arteries are normal in caliber. Reproductive:  The prostate appears to be surgically absent. Other:  None Musculoskeletal: There is high-grade marrow edema surrounding horizontal linear decreased T1 and decreased T2 signal apparent acute fracture line within the inferior left sacrum in zone 1, extending laterally to the left sacroiliac joint (axial image 18 and coronal image 12). There also is increased T2 signal that may represent true edema versus artifact within the inferior aspect of the bilateral pubic bodies (axial series 3 images  23 through 26). This is suspicious for at least marrow edema from degenerative change or bone marrow contusion. Cannot exclude tiny nondisplaced fractures in this region. IMPRESSION: 1. Unfortunately, the patient could not complete a this exam due to pain. There is  also motion artifact. 2. High-grade marrow edema surrounding horizontal linear nondisplaced acute fracture within the inferior left sacrum extending to the left sacroiliac joint. 3. Possible marrow edema within the inferior aspect of the bilateral pubic bodies. If this is real in the artifactual, it may represent bone marrow edema from a contusion versus additional nondisplaced fractures. Electronically Signed   By: Yvonne Kendall M.D.   On: 06/15/2022 07:10   MR LUMBAR SPINE WO CONTRAST  Result Date: 06/14/2022 CLINICAL DATA:  Initial evaluation for low back pain since bladder surgery, prior fall. EXAM: MRI LUMBAR SPINE WITHOUT CONTRAST TECHNIQUE: Multiplanar, multisequence MR imaging of the lumbar spine was performed. No intravenous contrast was administered. COMPARISON:  Prior MRI from 05/28/2022. FINDINGS: Segmentation: Transitional lumbosacral anatomy with partial sacralization of the L5 vertebral body. Same numbering system is employed as on previous exam. Alignment: Straightening of the normal lumbar lordosis. Trace stepwise retrolisthesis of L1 on L2 through L3 on L4. appearance is stable. Vertebrae: Vertebral body height maintained without acute or chronic fracture. Bone marrow signal intensity within normal limits. Node worrisome osseous lesions. No abnormal marrow edema. Conus medullaris and cauda equina: Conus extends to the T12 level. Conus and cauda equina appear normal. Paraspinal and other soft tissues: Paraspinous soft tissues demonstrate no acute finding. Left kidney is somewhat atrophic with multifocal cortical scarring. Few small simple cyst noted within the visualized right kidney, benign in appearance, no follow-up imaging recommended. Disc levels: L1-2: Mild disc bulge with disc desiccation. Superimposed small right paracentral disc protrusion minimally indents the right ventral thecal sac. No significant canal or lateral recess stenosis. Foramina remain patent. Appearance is stable. L2-3:  Shallow left extraforaminal disc protrusion closely approximates the exiting left L2 nerve root (series 12, image 17). No spinal stenosis. Foramina remain patent. Appearance is stable. L3-4: Disc desiccation with mild disc bulge. Superimposed left foraminal to extraforaminal disc protrusion contacts the exiting left L3 nerve root (series 12, image 24). Mild bilateral facet hypertrophy. No significant spinal stenosis. Mild left L3 foraminal narrowing. Right neural foramen remains patent. L4-5: Degenerative intervertebral disc space narrowing with diffuse disc bulge and disc desiccation. Associated reactive endplate spurring. Superimposed small central disc protrusion with inferior migration. Mild to moderate facet hypertrophy. Resultant mild canal with moderate bilateral subarticular stenosis. Mild to moderate bilateral L4 foraminal narrowing. Appearance is similar. L5-S1:  Negative interspace.  No canal or foraminal stenosis. IMPRESSION: 1. Similar appearance of the lumbar spine as compared to 05/28/2022. No acute abnormality. 2. Stable multifactorial degenerative changes at L4-5 with resultant mild canal and moderate bilateral subarticular stenosis, with mild to moderate bilateral L4 foraminal narrowing. 3. Left foraminal to extraforaminal disc protrusion at L3-4, contacting and potentially affecting the exiting left L3 nerve root. 4. Shallow left extraforaminal disc protrusion at L2-3, closely approximating and potentially irritating the exiting left L2 nerve root. Electronically Signed   By: Jeannine Boga M.D.   On: 06/14/2022 22:48      IMPRESSION/PLAN: 1. 66 y.o. gentleman with painful sacral metastasis from stage IV bladder cancer.  Today, I talked to the patient and family about the findings and work-up thus far.  We discussed the natural history of painful bone metastasis and general treatment, highlighting the role of radiotherapy in the  management.  We discussed the available radiation  techniques, and focused on the details of logistics and delivery.  We reviewed the anticipated acute and late sequelae associated with radiation in this setting.  The patient was encouraged to ask questions that I answered to the best of my ability.  The patient would like to proceed with radiation and will be scheduled for CT simulation.   We personally spent 60 minutes in this encounter including chart review, reviewing radiological studies, meeting Cruz-to-Cruz with the patient, entering orders and completing documentation.    Nicholos Johns, PA-C    Tyler Pita, MD  [facility] Direct Dial: [phone]  Fax: [phone] [email or web address]  Skype  [email or web address]

## 2022-07-09 NOTE — Progress Notes (Signed)
DISCONTINUE ON PATHWAY REGIMEN - Bladder     A cycle is every 21 days:     Gemcitabine      Cisplatin   **Always confirm dose/schedule in your pharmacy ordering system**  REASON: Disease Progression PRIOR TREATMENT: [id number]: Gemcitabine 1,000 mg/m2 D1, 8 + Cisplatin 70 mg/m2 D1 q21 Days x 4 Cycles TREATMENT RESPONSE: Partial Response (PR)  START ON PATHWAY REGIMEN - Bladder     A cycle is every 21 days:     Pembrolizumab   **Always confirm dose/schedule in your pharmacy ordering system**  Patient Characteristics: Advanced/Metastatic Disease, Second Line, FGFR2/FGFR3 Mutation Negative or Unknown, Prior Platinum-Based Therapy and No Prior PD-1/PD-L1 Inhibitor Therapeutic Status: Advanced/Metastatic Disease Line of Therapy: Second Line FGFR2/FGFR3 Mutation Status: Did Not Order Test Intent of Therapy: Non-Curative / Palliative Intent, Discussed with Patient

## 2022-07-09 NOTE — Progress Notes (Signed)
IP PROGRESS NOTE  Subjective:   Patient known to me with recent diagnosis of bladder cancer.  He is status post radical cystectomy was for increased pain after sacral fracture.  For details of his treatment please see my previous office note from 07/03/2022.  Clinically, he continues to have pain despite narcotic analgesic pain medication.  He reports he is not able to sleep more than 2 hours at a time.  Cannot get comfortable continues to ambulate around the room.  He does not have any neurological deficits.  Objective:  Vital signs in last 24 hours: Temp:  [97.6 F (36.4 C)-99 F (37.2 C)] 97.9 F (36.6 C) (07/18 0438) Pulse Rate:  [65-97] 92 (07/18 0438) Resp:  [10-20] 16 (07/18 0438) BP: (112-160)/(72-129) 120/73 (07/18 0438) SpO2:  [92 %-100 %] 97 % (07/18 0438) Weight:  [140 lb (63.5 kg)] 140 lb (63.5 kg) (07/17 1234) Weight change:  Last BM Date : 06/24/2022 (per pt)  Intake/Output from previous day: 07/17 0701 - 07/18 0700 In: 240 [P.O.:240] Out: 500 [Urine:500] General: Alert, awake appears in mild distress. Head: Normocephalic atraumatic. Mouth: mucous membranes moist, pharynx normal without lesions Eyes: No scleral icterus.  Pupils are equal and round reactive to light. Resp: clear to auscultation bilaterally without rhonchi or wheezes or dullness to percussion. Cardio: regular rate and rhythm, S1, S2 normal, no murmur, click, rub or gallop GI: soft, non-tender; bowel sounds normal; no masses,  no organomegaly Musculoskeletal: No joint deformity or effusion. Neurological: No motor, sensory deficits.  Intact deep tendon reflexes. Skin: No rashes or lesions.  Portacath without erythema  Lab Results: Recent Labs    06/24/2022 1251  WBC 12.2*  HGB 10.8*  HCT 31.8*  PLT 281    BMET Recent Labs    06/27/2022 1251  NA 137  K 4.1  CL 104  CO2 18*  GLUCOSE 124*  BUN 38*  CREATININE 1.19  CALCIUM 9.7    Studies/Results: CT ABDOMEN PELVIS W CONTRAST  Result  Date: 06/22/2022 CLINICAL DATA:  Bladder carcinoma, evaluate for metastatic disease EXAM: CT ABDOMEN AND PELVIS WITH CONTRAST TECHNIQUE: Multidetector CT imaging of the abdomen and pelvis was performed using the standard protocol following bolus administration of intravenous contrast. RADIATION DOSE REDUCTION: This exam was performed according to the departmental dose-optimization program which includes automated exposure control, adjustment of the mA and/or kV according to patient size and/or use of iterative reconstruction technique. CONTRAST:  151m OMNIPAQUE IOHEXOL 300 MG/ML  SOLN COMPARISON:  Previous studies including study done on 01/24/2022 FINDINGS: Lower chest: There is interval appearance of multiple nodules of varying sizes in the lower lung fields largest measuring 2.3 cm in size in left lower lobe. There is no pleural effusion. Hepatobiliary: There is interval appearance of multiple low-density lesions of varying sizes in the liver. Largest of the lesions measures approximally 1.9 cm. There is no dilation of bile ducts. Gallbladder is distended. There is no wall thickening in gallbladder. Pancreas: No focal abnormalities are seen. Spleen: Unremarkable. Adrenals/Urinary Tract: Adrenals are not enlarged. There is no hydronephrosis. Left kidney smaller than right. There are foci of cortical thinning in the kidneys, more so on the left side suggesting scarring from chronic pyelonephritis. There is interval diversion of ureters 2 ileal conduit in the right mid abdomen. Left renal pelvis is prominent measuring 2.2 cm in AP diameter. There is a small 1 mm calculus in the lower pole of right kidney. There are no demonstrable ureteral stones. Urinary bladder is not seen.  There are multiple soft tissue nodules of varying sizes in pelvic cavity largest in the right lateral pelvis measuring 4.9 x 4.5 cm. There is 3.6 cm mass in left side of pelvis posterior to the pubic bone. There is 4.2 x 2.2 cm mass in left  posterior pelvis. There are other smaller soft tissue nodules in pelvic cavity. Stomach/Bowel: There is fluid density in the lumen of the lower thoracic esophagus, possibly suggesting gastroesophageal reflux. Stomach is unremarkable. Small bowel loops are not dilated. Appendix is not distinctly seen. There is no pericecal inflammation. There is no significant wall thickening in colon. Vascular/Lymphatic: Vascular structures are unremarkable. Possibility lymphadenopathy in the pelvis is not excluded. Reproductive: Prostate is not seen. Other: There is no ascites or pneumoperitoneum. Musculoskeletal: No displaced fractures are seen. No definite focal lytic or sclerotic lesions are seen. IMPRESSION: There is interval appearance of multiple nodules of varying sizes in both lower lung fields suggesting pulmonary metastatic disease. There is interval appearance of multiple low-density lesions of varying sizes in the liver consistent with hepatic metastatic disease. There is interval appearance of multiple soft tissue masses of varying sizes in pelvic cavity largest measuring 4.9 cm in diameter. This may be due to local recurrence of bladder neoplasm and possibly pelvic lymphadenopathy due to metastatic disease. There is no evidence of intestinal obstruction or pneumoperitoneum. There is no hydronephrosis. Other findings as described in the body of the report. Electronically Signed   By: Elmer Picker M.D.   On: 06/25/2022 14:05    Medications: I have reviewed the patient's current medications.  Assessment/Plan:  66 year old with:  1.  Bladder cancer diagnosed in 2022.  He underwent neoadjuvant chemotherapy followed by radical cystectomy in April 2023.  CT scan on July 08, 2022 was personally reviewed and discussed with the patient today and showed relapsed disease.  He has multiple areas of metastasis including pelvic adenopathy hepatic metastasis and possibly lung involvement.  I discussed these findings  with the patient today.  The rapid recurrence of his cancer after surgery is very concerning and likely represents a refractory tumor which carries overall poor prognosis.  Salvage therapy options including immunotherapy which has been offered to the patient previously, antibody drug conjugate as well as oral targeted therapy were reviewed.  I recommended proceeding with systemic therapy utilizing immunotherapy in the immediate future after this hospitalization and attempt to palliate this cancer.  He understands the goal of therapy would be palliative.  2.  Sacral pain: Likely related to pathological fractures.  We will await input from orthopedic surgery.  I will ask Dr. Tammi Klippel radiation to evaluate the patient for possible course of palliative radiation therapy.  3.  Prognosis and goals of care: Prognosis is guarded and I believe he has limited life expectancy despite his reasonable performance status.  This was discussed with the patient today and he would be reasonable to have palliative services involved to help with goals of care as well as pain management.  We will continue to follow his progress during this hospitalization.  35  minutes were dedicated to this visit. The time was spent on reviewing  imaging studies, discussing treatment options, discussing prognosis and answering questions regarding future plan.    LOS: 0 days   Zola Button 07/09/2022, 7:33 AM

## 2022-07-09 NOTE — Progress Notes (Signed)
Initial Nutrition Assessment  DOCUMENTATION CODES:   Severe malnutrition in context of chronic illness  INTERVENTION:  Liberalize diet from a heart healthy to a regular diet to provide widest variety of menu options to enhance nutritional adequacy Carnation Instant Breakfast po TID - each supplement provides 140 kcal and 5g of protein  Magic cup TID with meals, each supplement provides 290 kcal and 9 grams of protein MVI with minerals daily  NUTRITION DIAGNOSIS:   Severe Malnutrition related to chronic illness, cancer and cancer related treatments (bladder adenocarcinoma) as evidenced by energy intake < or equal to 75% for > or equal to 1 month, percent weight loss.  GOAL:   Patient will meet greater than or equal to 90% of their needs  MONITOR:   PO intake, Supplement acceptance, Diet advancement, Labs, Weight trends  REASON FOR ASSESSMENT:   Malnutrition Screening Tool    ASSESSMENT:   Pt admitted from home with back pain. PMH significant for bladder adenocarcinoma with a h/o prostatectomy, cystectomy with ileal conduit, ADD, GERD, HLD and HTN.  Oncology following. Pt s/p chemotherapy and radical cystectomy in April 2023. Now with multiple areas of metastasis including pelvic adenopathy, hepatic metastasis and possible lung involvement. Plans for immunotherapy and palliative radiation treatment.   Spoke with pt at bedside. He reports that his intake has decreased from 2 to 1 meal per day recently as he has been experiencing pain and shakes d/t pain. He completed chemo in January and reports that his intake and weight had begun to improve. He occasionally drinks Muscle Milk at home but does not enjoy Ensure or Boost products. Denies difficulty chewing or swallowing. Discussed diet liberalization which pt was pleased with as he has been restricted when ordering meals and believes that this will help increase his nutritional intake. Did not receive lunch today as he was in [place].   Meal completions: 7/18: 85%-breakfast  Pt endorses a 10 lbs wt loss but did not report a usual wt. Reviewed wt history. Pt's wt appears to have been in decline since 01/10. Noted an 18.7% within the last 6 months which is clinically significant for time frame.   Medications: milk of magnesia, protonix, senna  Labs reviewed  NUTRITION - FOCUSED PHYSICAL EXAM:  Flowsheet Row Most Recent Value  Orbital Region Moderate depletion  Upper Arm Region Mild depletion  Thoracic and Lumbar Region No depletion  Buccal Region Mild depletion  Temple Region Moderate depletion  Clavicle Bone Region Moderate depletion  Clavicle and Acromion Bone Region Mild depletion  Scapular Bone Region No depletion  Dorsal Hand No depletion  Patellar Region Mild depletion  Anterior Thigh Region Mild depletion  Posterior Calf Region Mild depletion  Edema (RD Assessment) None  Hair Reviewed  Eyes Reviewed  Mouth Reviewed  Skin Reviewed  Nails Reviewed      Diet Order:   Diet Order             Diet Heart Room service appropriate? Yes; Fluid consistency: Thin  Diet effective now                   EDUCATION NEEDS:   Education needs have been addressed  Skin:  Skin Assessment: Reviewed RN Assessment  Last BM:  7/17  Height:   Ht Readings from Last 1 Encounters:  07/04/2022 '5\' 9"'$  (1.753 m)    Weight:   Wt Readings from Last 1 Encounters:  06/29/2022 63.5 kg   BMI:  Body mass index is 20.67  kg/m.  Estimated Nutritional Needs:   Kcal:  2000-2200  Protein:  100-115g  Fluid:  >/=2L  Clayborne Dana, RDN, LDN Clinical Nutrition

## 2022-07-09 NOTE — Consult Note (Signed)
ORTHOPAEDIC CONSULTATION  REQUESTING PHYSICIAN: Aline August, MD  Chief Complaint: Left-sided lower back pain with radicular symptoms down the medial aspect of the left thigh.  HPI: Jerome Cruz is a 66 y.o. male who presents with lower back pain and left-sided radicular symptoms.  Patient states that he has, within the past month, had 2 epidural steroid injections with [facility] imaging.  He states he only had a hour or so relief.    Past Medical History:  Diagnosis Date   Bladder tumor 09/26/2021   ED (erectile dysfunction)    GERD (gastroesophageal reflux disease)    Hyperlipidemia    Hypertension    Past Surgical History:  Procedure Laterality Date   ANKLE SURGERY Right    COLONOSCOPY  2012   COLONOSCOPY  03/27/2018   CYSTOSCOPY WITH INJECTION N/A 03/27/2022   Procedure: CYSTOSCOPY WITH INJECTION OF INDOCYANINE GREEN DYE;  Surgeon: Alexis Frock, MD;  Location: [facility];  Service: Urology;  Laterality: N/A;   CYSTOSCOPY WITH URETHRAL DILATATION N/A 10/02/2021   Procedure: CYSTOSCOPY WITH URETHRAL BALLOON DILATATION;  Surgeon: Robley Fries, MD;  Location: [facility];  Service: Urology;  Laterality: N/A;   IR IMAGING GUIDED PORT INSERTION  10/24/2021   LYMPH NODE DISSECTION Bilateral 03/27/2022   Procedure: PELVIC LYMPH NODE DISSECTION;  Surgeon: Alexis Frock, MD;  Location: [facility];  Service: Urology;  Laterality: Bilateral;   ROBOT ASSISTED LAPAROSCOPIC COMPLETE CYSTECT ILEAL CONDUIT N/A 03/27/2022   Procedure: XI ROBOTIC ASSISTED LAPAROSCOPIC COMPLETE CYSTECT ILEAL CONDUIT;  Surgeon: Alexis Frock, MD;  Location: [facility];  Service: Urology;  Laterality: N/A;  6 HRS   ROBOT ASSISTED LAPAROSCOPIC RADICAL PROSTATECTOMY N/A 03/27/2022   Procedure: XI ROBOTIC ASSISTED LAPAROSCOPIC RADICAL PROSTATECTOMY;  Surgeon: Alexis Frock, MD;  Location: [facility];  Service: Urology;  Laterality: N/A;   TRANSURETHRAL RESECTION OF BLADDER TUMOR WITH MITOMYCIN-C N/A  10/02/2021   Procedure: TRANSURETHRAL RESECTION OF BLADDER TUMOR WITH GEMCITABINE;  Surgeon: Robley Fries, MD;  Location: [facility];  Service: Urology;  Laterality: N/A;  57 MINS   Social History   Socioeconomic History   Marital status: Married    Spouse name: Not on file   Number of children: 1   Years of education: Not on file   Highest education level: Not on file  Occupational History   Occupation: self employed-grading / concrete  Tobacco Use   Smoking status: Former    Packs/day: 1.00    Years: 15.00    Total pack years: 15.00    Types: Cigarettes    Quit date: 08/23/2001    Years since quitting: 20.8   Smokeless tobacco: Never  Vaping Use   Vaping Use: Never used  Substance and Sexual Activity   Alcohol use: Yes    Comment: occasional beer   Drug use: No   Sexual activity: Not on file  Other Topics Concern   Not on file  Social History Narrative   The patient is married with 1 son   He is a self-employed Psychiatrist, he also has livestock and greenhouses   No tobacco or drug use, occasional beer   Social Determinants of Radio broadcast assistant Strain: Not on file  Food Insecurity: Not on file  Transportation Needs: Not on file  Physical Activity: Not on file  Stress: Not on file  Social Connections: Not on file   Family History  Problem Relation Age of Onset   Diabetes Mother  Hypertension Mother    Cancer Father        type unknown   Diabetes Mellitus I Son        since age 39   Colon cancer Neg Hx    Rectal cancer Neg Hx    Stomach cancer Neg Hx    Esophageal cancer Neg Hx    - negative except otherwise stated in the family history section Allergies  Allergen Reactions   Atorvastatin Other (See Comments)    Pt unsure of reaction    Irbesartan Other (See Comments)    Pt unsure of reaction    Prior to Admission medications   Medication Sig Start Date End Date Taking? Authorizing Provider   acetaminophen (TYLENOL) 500 MG tablet Take 500 mg by mouth every 6 (six) hours as needed for moderate pain.   Yes [provider]  esomeprazole (NEXIUM) 20 MG capsule Take 20 mg by mouth daily.   Yes [provider]  gabapentin (NEURONTIN) 300 MG capsule Take 300 mg by mouth 2 (two) times daily. 07/04/22  Yes [provider]  ibuprofen (ADVIL) 200 MG tablet Take 200 mg by mouth every 6 (six) hours as needed for moderate pain.   Yes [provider]  oxyCODONE (OXY IR/ROXICODONE) 5 MG immediate release tablet Take 1 tablet (5 mg total) by mouth every 4 (four) hours as needed for severe pain. Take 1 to 2 tablets every 4 hours for pain. 07/04/22  Yes Wyatt Portela, MD  simvastatin (ZOCOR) 20 MG tablet Take 20 mg by mouth daily.   Yes [provider]  valsartan (DIOVAN) 80 MG tablet Take 80 mg by mouth in the morning.   Yes [provider]  lidocaine-prilocaine (EMLA) cream Apply 1 application topically as needed. Patient not taking: Reported on 03/21/2022 10/17/21   Wyatt Portela, MD  methocarbamol (ROBAXIN) 750 MG tablet Take 1 tablet (750 mg total) by mouth every 6 (six) hours as needed for muscle spasms. Patient not taking: Reported on 06/29/2022 06/21/22   Lanae Crumbly, PA-C  prochlorperazine (COMPAZINE) 10 MG tablet Take 1 tablet (10 mg total) by mouth every 6 (six) hours as needed for nausea or vomiting. Patient not taking: Reported on 03/21/2022 10/17/21   Wyatt Portela, MD  senna-docusate (SENOKOT-S) 8.6-50 MG tablet Take 1 tablet by mouth 2 (two) times daily. While taking strong pain meds to prevent constipation Patient not taking: Reported on 07/03/2022 04/02/22   Alexis Frock, MD   CT ABDOMEN PELVIS W CONTRAST  Result Date: 06/26/2022 CLINICAL DATA:  Bladder carcinoma, evaluate for metastatic disease EXAM: CT ABDOMEN AND PELVIS WITH CONTRAST TECHNIQUE: Multidetector CT imaging of the abdomen and pelvis was performed using the  standard protocol following bolus administration of intravenous contrast. RADIATION DOSE REDUCTION: This exam was performed according to the departmental dose-optimization program which includes automated exposure control, adjustment of the mA and/or kV according to patient size and/or use of iterative reconstruction technique. CONTRAST:  143m OMNIPAQUE IOHEXOL 300 MG/ML  SOLN COMPARISON:  Previous studies including study done on 01/24/2022 FINDINGS: Lower chest: There is interval appearance of multiple nodules of varying sizes in the lower lung fields largest measuring 2.3 cm in size in left lower lobe. There is no pleural effusion. Hepatobiliary: There is interval appearance of multiple low-density lesions of varying sizes in the liver. Largest of the lesions measures approximally 1.9 cm. There is no dilation of bile ducts. Gallbladder is distended. There is no wall thickening in gallbladder. Pancreas:  No focal abnormalities are seen. Spleen: Unremarkable. Adrenals/Urinary Tract: Adrenals are not enlarged. There is no hydronephrosis. Left kidney smaller than right. There are foci of cortical thinning in the kidneys, more so on the left side suggesting scarring from chronic pyelonephritis. There is interval diversion of ureters 2 ileal conduit in the right mid abdomen. Left renal pelvis is prominent measuring 2.2 cm in AP diameter. There is a small 1 mm calculus in the lower pole of right kidney. There are no demonstrable ureteral stones. Urinary bladder is not seen. There are multiple soft tissue nodules of varying sizes in pelvic cavity largest in the right lateral pelvis measuring 4.9 x 4.5 cm. There is 3.6 cm mass in left side of pelvis posterior to the pubic bone. There is 4.2 x 2.2 cm mass in left posterior pelvis. There are other smaller soft tissue nodules in pelvic cavity. Stomach/Bowel: There is fluid density in the lumen of the lower thoracic esophagus, possibly suggesting gastroesophageal reflux.  Stomach is unremarkable. Small bowel loops are not dilated. Appendix is not distinctly seen. There is no pericecal inflammation. There is no significant wall thickening in colon. Vascular/Lymphatic: Vascular structures are unremarkable. Possibility lymphadenopathy in the pelvis is not excluded. Reproductive: Prostate is not seen. Other: There is no ascites or pneumoperitoneum. Musculoskeletal: No displaced fractures are seen. No definite focal lytic or sclerotic lesions are seen. IMPRESSION: There is interval appearance of multiple nodules of varying sizes in both lower lung fields suggesting pulmonary metastatic disease. There is interval appearance of multiple low-density lesions of varying sizes in the liver consistent with hepatic metastatic disease. There is interval appearance of multiple soft tissue masses of varying sizes in pelvic cavity largest measuring 4.9 cm in diameter. This may be due to local recurrence of bladder neoplasm and possibly pelvic lymphadenopathy due to metastatic disease. There is no evidence of intestinal obstruction or pneumoperitoneum. There is no hydronephrosis. Other findings as described in the body of the report. Electronically Signed   By: Elmer Picker M.D.   On: 07/07/2022 14:05   - pertinent xrays, CT, MRI studies were reviewed and independently interpreted  Positive ROS: All other systems have been reviewed and were otherwise negative with the exception of those mentioned in the HPI and as above.  Physical Exam: General: Alert, no acute distress Psychiatric: Patient is competent for consent with normal mood and affect Lymphatic: No axillary or cervical lymphadenopathy Cardiovascular: No pedal edema Respiratory: No cyanosis, no use of accessory musculature GI: No organomegaly, abdomen is soft and non-tender    Images:  '@ENCIMAGES'$ @  Labs:  No results found for: "HGBA1C", "ESRSEDRATE", "CRP", "LABURIC", "REPTSTATUS", "GRAMSTAIN", "CULT",  "LABORGA"  Lab Results  Component Value Date   ALBUMIN 4.0 05/30/2022   ALBUMIN 4.1 03/26/2022   ALBUMIN 4.2 01/24/2022        Latest Ref Rng & Units 06/24/2022   12:51 PM 05/30/2022   12:58 PM 04/01/2022    3:45 AM  CBC EXTENDED  WBC 4.0 - 10.5 K/uL 12.2  8.1    RBC 4.22 - 5.81 MIL/uL 3.32  3.20    Hemoglobin 13.0 - 17.0 g/dL 10.8  10.2  10.0   HCT 39.0 - 52.0 % 31.8  29.9  28.8   Platelets 150 - 400 K/uL 281  205    NEUT# 1.7 - 7.7 K/uL 8.6  4.7    Lymph# 0.7 - 4.0 K/uL 2.8  2.6      Neurologic: Patient does not have protective sensation bilateral lower  extremities.   MUSCULOSKELETAL:   Skin: Patient has no cutaneous lesions.  He has radicular pain from the left flank that radiates down the medial aspect of the left thigh.  Patient has no pain with range of motion of the hip knee or ankle.  Patient has no focal motor weakness.  Review of the MRI scan does show bulging disks in the lumbar spine at L3-4 which may be the source of the L3 nerve root irritation.  MRI scan of the pelvis does show some edema in the sacrum however patient is asymptomatic at this location.  Patient's white cell count has increased since admission.  Currently 12.2.  Assessment: Assessment: Persistent chronic left-sided radicular symptoms consistent with L3 nerve root impingement with acute trauma to the sacrum that is asymptomatic.  Plan: Plan: Patient has had 2 recent epidural steroid injections which have not provided him any relief.  He has had relief in the past from an epidural steroid injection.  Would continue with medical management and physical therapy to help with his radicular symptoms.  Thank you for the consult and the opportunity to see Jerome Cruz, [place] [phone] 4:59 PM

## 2022-07-09 NOTE — Progress Notes (Signed)
  Radiation Oncology         [phone] ________________________________  Name: Jerome Cruz MRN: [id number]  Date: 07/09/2022  DOB: 12/29/1955  INPATIENT  SIMULATION AND TREATMENT PLANNING NOTE    ICD-10-CM   1. Metastasis to bone [facility])  C79.51       DIAGNOSIS:  66 yo man with painful sacral metastasis from bladder cancer  NARRATIVE:  The patient was brought to the [facility].  Identity was confirmed.  All relevant records and images related to the planned course of therapy were reviewed.  The patient freely provided informed written consent to proceed with treatment after reviewing the details related to the planned course of therapy. The consent form was witnessed and verified by the simulation staff.  Then, the patient was set-up in a stable reproducible  supine position for radiation therapy.  CT images were obtained.  Surface markings were placed.  The CT images were loaded into the planning software.  Then the target and avoidance structures were contoured.  Treatment planning then occurred.  The radiation prescription was entered and confirmed.  Then, I designed and supervised the construction of a total of 3 medically necessary complex treatment devices consisting of leg positioner and MLC apertures to cover the treated area.  I have requested : 3D Simulation  I have requested a DVH of the following structures: Rectum, Bladder, femoral heads and target.  PLAN:  The patient will receive 30 Gy in 10 fractions.  ________________________________  Sheral Apley Tammi Klippel, M.D.

## 2022-07-10 ENCOUNTER — Other Ambulatory Visit: Payer: Self-pay

## 2022-07-10 ENCOUNTER — Ambulatory Visit
Admit: 2022-07-10 | Discharge: 2022-07-10 | Disposition: A | Discharge status: Home / Self Care | Payer: BC Managed Care – PPO | Attending: Radiation Oncology | Admitting: Radiation Oncology

## 2022-07-10 DIAGNOSIS — M5459 Other low back pain: Secondary | ICD-10-CM | POA: Diagnosis not present

## 2022-07-10 DIAGNOSIS — E43 Unspecified severe protein-calorie malnutrition: Secondary | ICD-10-CM | POA: Insufficient documentation

## 2022-07-10 LAB — CBC WITH DIFFERENTIAL/PLATELET
Abs Immature Granulocytes: 0.04 10*3/uL (ref 0.00–0.07)
Basophils Absolute: 0 10*3/uL (ref 0.0–0.1)
Basophils Relative: 0 %
Eosinophils Absolute: 0.1 10*3/uL (ref 0.0–0.5)
Eosinophils Relative: 1 %
HCT: 24.5 % — ABNORMAL LOW (ref 39.0–52.0)
Hemoglobin: 8.2 g/dL — ABNORMAL LOW (ref 13.0–17.0)
Immature Granulocytes: 0 %
Lymphocytes Relative: 15 %
Lymphs Abs: 1.5 10*3/uL (ref 0.7–4.0)
MCH: 32.4 pg (ref 26.0–34.0)
MCHC: 33.5 g/dL (ref 30.0–36.0)
MCV: 96.8 fL (ref 80.0–100.0)
Monocytes Absolute: 0.7 10*3/uL (ref 0.1–1.0)
Monocytes Relative: 7 %
Neutro Abs: 7.4 10*3/uL (ref 1.7–7.7)
Neutrophils Relative %: 77 %
Platelets: 215 10*3/uL (ref 150–400)
RBC: 2.53 MIL/uL — ABNORMAL LOW (ref 4.22–5.81)
RDW: 15.5 % (ref 11.5–15.5)
WBC: 9.6 10*3/uL (ref 4.0–10.5)
nRBC: 0 % (ref 0.0–0.2)

## 2022-07-10 LAB — BASIC METABOLIC PANEL
Anion gap: 10 (ref 5–15)
BUN: 57 mg/dL — ABNORMAL HIGH (ref 8–23)
CO2: 26 mmol/L (ref 22–32)
Calcium: 9.7 mg/dL (ref 8.9–10.3)
Chloride: 102 mmol/L (ref 98–111)
Creatinine, Ser: 1.58 mg/dL — ABNORMAL HIGH (ref 0.61–1.24)
GFR, Estimated: 49 mL/min — ABNORMAL LOW (ref >60)
Glucose, Bld: 125 mg/dL — ABNORMAL HIGH (ref 70–99)
Potassium: 4.3 mmol/L (ref 3.5–5.1)
Sodium: 138 mmol/L (ref 135–145)

## 2022-07-10 LAB — RAD ONC ARIA SESSION SUMMARY
Course Elapsed Days: 0
Plan Fractions Treated to Date: 1
Plan Prescribed Dose Per Fraction: 3 Gy
Plan Total Fractions Prescribed: 10
Plan Total Prescribed Dose: 30 Gy
Reference Point Dosage Given to Date: 3 Gy
Reference Point Session Dosage Given: 3 Gy
Session Number: 1

## 2022-07-10 LAB — MAGNESIUM: Magnesium: 2.6 mg/dL — ABNORMAL HIGH (ref 1.7–2.4)

## 2022-07-10 MED ORDER — CHLORHEXIDINE GLUCONATE CLOTH 2 % EX PADS
6.0000 | MEDICATED_PAD | Freq: Every day | CUTANEOUS | Status: DC
  Administered 2022-07-10 – 2022-07-13 (×4): 6 via TOPICAL

## 2022-07-10 MED ORDER — LIDOCAINE 5 % EX PTCH
2.0000 | MEDICATED_PATCH | CUTANEOUS | Status: DC
  Administered 2022-07-10 – 2022-07-12 (×3): 2 via TRANSDERMAL
  Filled 2022-07-10 (×4): qty 2

## 2022-07-10 MED ORDER — TRAZODONE HCL 50 MG PO TABS
50.0000 mg | ORAL_TABLET | Freq: Every evening | ORAL | Status: DC | PRN
  Administered 2022-07-13: 50 mg via ORAL
  Filled 2022-07-10: qty 1

## 2022-07-10 MED ORDER — KETOROLAC TROMETHAMINE 30 MG/ML IJ SOLN
30.0000 mg | Freq: Once | INTRAMUSCULAR | Status: AC
  Administered 2022-07-10: 30 mg via INTRAVENOUS
  Filled 2022-07-10: qty 1

## 2022-07-10 MED ORDER — SODIUM CHLORIDE 0.9% FLUSH
10.0000 mL | Freq: Two times a day (BID) | INTRAVENOUS | Status: DC
  Administered 2022-07-10 – 2022-07-13 (×8): 10 mL

## 2022-07-10 MED ORDER — ALPRAZOLAM 0.5 MG PO TABS
0.5000 mg | ORAL_TABLET | Freq: Three times a day (TID) | ORAL | Status: DC | PRN
  Administered 2022-07-11 – 2022-07-13 (×4): 0.5 mg via ORAL
  Filled 2022-07-10 (×4): qty 1

## 2022-07-10 MED ORDER — LORAZEPAM 2 MG/ML IJ SOLN
0.5000 mg | Freq: Once | INTRAMUSCULAR | Status: AC
  Administered 2022-07-10: 0.5 mg via INTRAVENOUS
  Filled 2022-07-10: qty 1

## 2022-07-10 MED ORDER — MORPHINE SULFATE ER 30 MG PO TBCR
30.0000 mg | EXTENDED_RELEASE_TABLET | Freq: Two times a day (BID) | ORAL | Status: DC
  Administered 2022-07-10 – 2022-07-12 (×4): 30 mg via ORAL
  Filled 2022-07-10 (×5): qty 1

## 2022-07-10 MED ORDER — OXYCODONE HCL 5 MG PO TABS
15.0000 mg | ORAL_TABLET | ORAL | Status: DC | PRN
  Administered 2022-07-10: 15 mg via ORAL
  Filled 2022-07-10 (×2): qty 3

## 2022-07-10 MED ORDER — SODIUM CHLORIDE 0.9% FLUSH
10.0000 mL | INTRAVENOUS | Status: DC | PRN
  Administered 2022-07-13: 10 mL

## 2022-07-10 MED ORDER — DEXAMETHASONE SODIUM PHOSPHATE 10 MG/ML IJ SOLN
4.0000 mg | Freq: Once | INTRAMUSCULAR | Status: AC
  Administered 2022-07-10: 4 mg via INTRAVENOUS
  Filled 2022-07-10: qty 1

## 2022-07-10 MED ORDER — GABAPENTIN 400 MG PO CAPS
400.0000 mg | ORAL_CAPSULE | Freq: Three times a day (TID) | ORAL | Status: DC
  Administered 2022-07-10 – 2022-07-13 (×6): 400 mg via ORAL
  Filled 2022-07-10 (×8): qty 1

## 2022-07-10 NOTE — Progress Notes (Signed)
Subjective:    Jerome Cruz reports slight improvement in his pain he was able to lay flat and get some sleep.  He did have an episode of nausea after taking a shower.  He denies any neurological deficits.  He denies any weakness.   Objective:   Vital signs in last 24 hours: Temp:  [97.6 F (36.4 C)-99 F (37.2 C)] 97.9 F (36.6 C) (07/18 0438) Pulse Rate:  [65-97] 92 (07/18 0438) Resp:  [10-20] 16 (07/18 0438) BP: (112-160)/(72-129) 120/73 (07/18 0438) SpO2:  [92 %-100 %] 97 % (07/18 0438) Weight:  [140 lb (63.5 kg)] 140 lb (63.5 kg) (07/17 1234) Weight change:  Last BM Date : 07/19/2022 (per pt)   Intake/Output from previous day: 07/17 0701 - 07/18 0700 In: 240 [P.O.:240] Out: 500 [Urine:500]  More comfortable appearing gentleman without any distress.   Portacath without erythema   Lab Results: Recent Labs (last 2 labs)      Recent Labs    07/10/2022 1251  WBC 12.2*  HGB 10.8*  HCT 31.8*  PLT 281        BMET Recent Labs (last 2 labs)      Recent Labs    06/26/2022 1251  NA 137  K 4.1  CL 104  CO2 18*  GLUCOSE 124*  BUN 38*  CREATININE 1.19  CALCIUM 9.7        Studies/Results:  Imaging Results (Last 48 hours)   Medications: I have reviewed the patient's current medications.   Assessment/Plan:   66 year old with:  1.  Stage IV high-grade urothelial carcinoma of the bladder documented in July 2023.  He initially presented with localized disease in 2022.    His disease status was updated again today and treatment choices were reviewed.  He would be a reasonable candidate for immunotherapy given his chemotherapy refractory disease.  I anticipate starting this the first week of August after completing his radiation treatment.  Alternative treatment options including FGFR inhibitors as well as antibody drug conjugate or combination of the above.   2.  Sacral pain: Radiation therapy to start today and currently on medical therapy utilizing Dilaudid and  Neurontin.   3.  Prognosis and goals of care: This was discussed again today with the patient.  He understands disease is incurable but aggressive measures are warranted given his young age and reasonable performance status.  4.  Disposition and follow-up: We will arrange follow-up upon discharge.  This will be tentatively the first week of August to start systemic therapy.   25  minutes were spent on this visit.  The time was dedicated to reviewing treatment choices, future plan of care as well as answering questions regarding prognosis.

## 2022-07-10 NOTE — Progress Notes (Signed)
PROGRESS NOTE  Jerome Cruz:[id number] DOB: 08/17/1956 DOA: 06/26/2022 PCP: [name], MD   LOS: 1 day   Brief Narrative / Interim history: 65 y.o. male with medical history significant of bladder adenocarcinoma with a history of prostatectomy, cystectomy with ileal conduit, ADD, GERD, hyperlipidemia, hypertension who was referred from the cancer center due to persistent lower back pain despite taking oxycodone after having a fall last month that resulted in pathological sacral fracture.  On presentation, CT of abdomen and pelvis with contrast showed interval appearance of multiple nodules in both lungs as well as liver along with multiple new soft tissue masses in the pelvic cavity.  Orthopedic/oncology were consulted.  Subjective / 24h Interval events: Complains of significant low back pain, radiating into his groin.  Unable to sleep.  Feels like IV Dilaudid is helping but does not last long.  Does not feel like oxycodone is helping  Assesement and Plan: Principal Problem:   Intractable low back pain Active Problems:   Essential hypertension   GERD   Malignant neoplasm of urinary bladder (HCC)   Normocytic anemia   Hyperlipidemia   Leukocytosis   Intractable pain   Protein-calorie malnutrition, severe   Principal problem Intractable low back pain, pathological sacral fractures - Presented with intractable low back pain possibly from pathological sacral fractures and not responding to oral pain medications at home.  Still in significant pain requiring IV and oral opiates.  Increase oxycodone to 15 mg with hopes to reduce IV pain medication usage.  Continue gabapentin.  Continue bowel regimen.  Orthopedic surgery evaluated patient, no role for surgery.  Radiation oncology consulted, to start radiation today   Active problems Metastatic bladder cancer -With history of prostatectomy, cystectomy with ileal conduit and chemotherapy. -Imaging on presentation revealed multiple areas  of metastases as above.  Oncology following, plan for immunotherapy as an outpatient.  AKI -creatinine increased to 1.58 from 1.1.  I see some soft blood pressures earlier this morning versus NSAID use.  Hold ARB, hold total  Leukocytosis -Possibly reactive.  WBC now normalized  Anemia of chronic disease -Hemoglobin stable.  No signs of bleeding.   Essential hypertension-hold valsartan today due to acute kidney injury   Hyperlipidemia -Continue simvastatin  GERD -Continue PPI  Scheduled Meds:  Chlorhexidine Gluconate Cloth  6 each Topical Daily   gabapentin  300 mg Oral BID   ketorolac  15 mg Intravenous Q6H   lidocaine  1 patch Transdermal Q24H   magnesium hydroxide  15 mL Oral QHS   multivitamin with minerals  1 tablet Oral Daily   pantoprazole  40 mg Oral Daily   senna-docusate  1 tablet Oral BID   simvastatin  20 mg Oral Daily   sodium chloride flush  10-40 mL Intracatheter Q12H   Continuous Infusions: PRN Meds:.acetaminophen **OR** acetaminophen, ALPRAZolam, alum & mag hydroxide-simeth, HYDROmorphone (DILAUDID) injection, oxyCODONE, sodium chloride flush, traZODone  Diet Orders (From admission, onward)     Start     Ordered   07/09/22 1939  Diet regular Room service appropriate? Yes; Fluid consistency: Thin  Diet effective now       Question Answer Comment  Room service appropriate? Yes   Fluid consistency: Thin      07/09/22 1938            DVT prophylaxis: SCDs Start: 07/18/2022 1434   Lab Results  Component Value Date   PLT 215 07/10/2022      Code Status: Full Code  Family Communication: no  family at bedside   Status is: Inpatient  Remains inpatient appropriate because: uncontrolled pain   Level of care: Med-Surg  Consultants:  Oncology RadOnc Orthopedic surgery   Objective: Vitals:   07/09/22 2010 07/10/22 0303 07/10/22 0813 07/10/22 1214  BP: 102/68 118/69 99/70 113/62  Pulse: 98 94 89 96  Resp: '16 16  20  '$ Temp: 98.6 F (37 C) (!)  97.4 F (36.3 C)  98.8 F (37.1 C)  TempSrc: Oral Oral  Oral  SpO2: 90% 92%  90%  Weight:      Height:        Intake/Output Summary (Last 24 hours) at 07/10/2022 1244 Last data filed at 07/10/2022 0700 Gross per 24 hour  Intake 474 ml  Output 350 ml  Net 124 ml   Wt Readings from Last 3 Encounters:  07/17/2022 63.5 kg  06/21/22 71.2 kg  05/30/22 71.2 kg    Examination:  Constitutional: NAD Eyes: no scleral icterus ENMT: Mucous membranes are moist.  Neck: normal, supple Respiratory: clear to auscultation bilaterally, no wheezing, no crackles. Normal respiratory effort. No accessory muscle use.  Cardiovascular: Regular rate and rhythm, no murmurs / rubs / gallops. No LE edema. Good peripheral pulses Abdomen: non distended, no tenderness. Bowel sounds positive.  Musculoskeletal: no clubbing / cyanosis.  Skin: no rashes Neurologic: non focal    Data Reviewed: I have independently reviewed following labs and imaging studies   CBC Recent Labs  Lab 06/30/2022 1251 07/10/22 0429  WBC 12.2* 9.6  HGB 10.8* 8.2*  HCT 31.8* 24.5*  PLT 281 215  MCV 95.8 96.8  MCH 32.5 32.4  MCHC 34.0 33.5  RDW 15.0 15.5  LYMPHSABS 2.8 1.5  MONOABS 0.7 0.7  EOSABS 0.1 0.1  BASOSABS 0.0 0.0    Recent Labs  Lab 07/18/2022 1251 07/10/22 0429  NA 137 138  K 4.1 4.3  CL 104 102  CO2 18* 26  GLUCOSE 124* 125*  BUN 38* 57*  CREATININE 1.19 1.58*  CALCIUM 9.7 9.7  MG  --  2.6*  INR 1.1  --     ------------------------------------------------------------------------------------------------------------------ No results for input(s): "CHOL", "HDL", "LDLCALC", "TRIG", "CHOLHDL", "LDLDIRECT" in the last 72 hours.  No results found for: "HGBA1C" ------------------------------------------------------------------------------------------------------------------ No results for input(s): "TSH", "T4TOTAL", "T3FREE", "THYROIDAB" in the last 72 hours.  Invalid input(s): "FREET3"  Cardiac  Enzymes No results for input(s): "CKMB", "TROPONINI", "MYOGLOBIN" in the last 168 hours.  Invalid input(s): "CK" ------------------------------------------------------------------------------------------------------------------ No results found for: "BNP"  CBG: No results for input(s): "GLUCAP" in the last 168 hours.  No results found for this or any previous visit (from the past 240 hour(s)).   Radiology Studies: No results found.   Marzetta Board, MD, PhD [facility]  Between 7 am - 7 pm I am available, please contact me via Amion (for emergencies) or Securechat (non urgent messages)  Between 7 pm - 7 am I am not available, please contact night coverage MD/APP via Amion

## 2022-07-10 NOTE — Progress Notes (Addendum)
1800 After decadron and ativan given, pt became very confused, agitated, keeps trying to get OOB. Pt had to be reoriented multiple times to stay in the bed. Wife and son at bedside to assist RN.   1845 Pt now resting comfortably, NAD, WCTM, fall precautions in place.   1855 Pt awake, confused, trying to get out of bed. Trying to "drive his truck". Pt re-oriented, and assisted back to bed. Fall precautions in place, [facility]

## 2022-07-11 ENCOUNTER — Ambulatory Visit
Admit: 2022-07-11 | Discharge: 2022-07-11 | Disposition: A | Discharge status: Home / Self Care | Payer: BC Managed Care – PPO | Attending: Radiation Oncology | Admitting: Radiation Oncology

## 2022-07-11 ENCOUNTER — Ambulatory Visit: Payer: BC Managed Care – PPO

## 2022-07-11 ENCOUNTER — Ambulatory Visit: Payer: BC Managed Care – PPO | Admitting: Radiation Oncology

## 2022-07-11 ENCOUNTER — Inpatient Hospital Stay (HOSPITAL_COMMUNITY): Payer: BC Managed Care – PPO

## 2022-07-11 ENCOUNTER — Other Ambulatory Visit: Payer: Self-pay

## 2022-07-11 DIAGNOSIS — M5459 Other low back pain: Secondary | ICD-10-CM | POA: Diagnosis not present

## 2022-07-11 DIAGNOSIS — M7989 Other specified soft tissue disorders: Secondary | ICD-10-CM

## 2022-07-11 LAB — BASIC METABOLIC PANEL
Anion gap: 10 (ref 5–15)
BUN: 60 mg/dL — ABNORMAL HIGH (ref 8–23)
CO2: 26 mmol/L (ref 22–32)
Calcium: 9.5 mg/dL (ref 8.9–10.3)
Chloride: 99 mmol/L (ref 98–111)
Creatinine, Ser: 1.39 mg/dL — ABNORMAL HIGH (ref 0.61–1.24)
GFR, Estimated: 57 mL/min — ABNORMAL LOW (ref >60)
Glucose, Bld: 153 mg/dL — ABNORMAL HIGH (ref 70–99)
Potassium: 4.2 mmol/L (ref 3.5–5.1)
Sodium: 135 mmol/L (ref 135–145)

## 2022-07-11 LAB — RAD ONC ARIA SESSION SUMMARY
Course Elapsed Days: 1
Plan Fractions Treated to Date: 2
Plan Prescribed Dose Per Fraction: 3 Gy
Plan Total Fractions Prescribed: 10
Plan Total Prescribed Dose: 30 Gy
Reference Point Dosage Given to Date: 6 Gy
Reference Point Session Dosage Given: 3 Gy
Session Number: 2

## 2022-07-11 LAB — MAGNESIUM: Magnesium: 2.2 mg/dL (ref 1.7–2.4)

## 2022-07-11 MED ORDER — DEXAMETHASONE SODIUM PHOSPHATE 10 MG/ML IJ SOLN
4.0000 mg | INTRAMUSCULAR | Status: DC
  Administered 2022-07-11 – 2022-07-13 (×3): 4 mg via INTRAVENOUS
  Filled 2022-07-11 (×3): qty 1

## 2022-07-11 MED ORDER — ENOXAPARIN SODIUM 40 MG/0.4ML IJ SOSY
40.0000 mg | PREFILLED_SYRINGE | INTRAMUSCULAR | Status: DC
  Administered 2022-07-11 – 2022-07-13 (×3): 40 mg via SUBCUTANEOUS
  Filled 2022-07-11 (×3): qty 0.4

## 2022-07-11 NOTE — Progress Notes (Signed)
Bilateral lower extremity venous duplex has been completed. Preliminary results can be found in CV Proc through chart review.   07/11/22 10:19 AM Jerome Cruz RVT

## 2022-07-11 NOTE — Consult Note (Signed)
Palliative Care Consultation Note  66 yo with metastatic prostate cancer admitted with intractable pain secondary to bone mets including bilateral sacral pathologic fractures.Palliative Care consulted for symptom management.  Patient anxious and visibly uncomfortable when I saw him this evening. He was shaking due to pain and trying to reposition himself in the bed. He has been agitated and thinks the pain medication is making him feel worse so he has had significantly less opioid today and his pain has increased today.  Recommendations:  Started Radiation today, had difficulty on table due to pain, need to premedicate with IV hydromorphone and ativan prior to tx. MS Contin 30 BID (needs long acting) total opioid eq is >100/qd on average Hydromorphone '1mg'$  q2 prn Decadron 4 mg now given late in day, increase to '8mg'$  in AM Toradol TID-switch to oral NSAID as tolerated Kpad ice to sacrum Topical lidoderm patch Increased neurontin to 400 TID Increased PRN alprazolam and gave IV ativan dose X1 this afternoon  Will follow for pain management needs.  Lane Hacker, DO Palliative Medicine

## 2022-07-11 NOTE — Progress Notes (Signed)
PROGRESS NOTE  Jerome Cruz BHA:[id number] DOB: 26-Mar-1956 DOA: 07/07/2022 PCP: [name], MD   LOS: 2 days   Brief Narrative / Interim history: 66 y.o. male with medical history significant of bladder adenocarcinoma with a history of prostatectomy, cystectomy with ileal conduit, ADD, GERD, hyperlipidemia, hypertension who was referred from the cancer center due to persistent lower back pain despite taking oxycodone after having a fall last month that resulted in pathological sacral fracture.  On presentation, CT of abdomen and pelvis with contrast showed interval appearance of multiple nodules in both lungs as well as liver along with multiple new soft tissue masses in the pelvic cavity.  Orthopedic/oncology were consulted.  Subjective / 24h Interval events: Pain is better this morning, but he got confused last night after getting Decadron and IV Ativan.  Assesement and Plan: Principal Problem:   Intractable low back pain Active Problems:   Essential hypertension   GERD   Malignant neoplasm of urinary bladder (HCC)   Normocytic anemia   Hyperlipidemia   Leukocytosis   Intractable pain   Protein-calorie malnutrition, severe   Principal problem Intractable low back pain, pathological sacral fractures - Presented with intractable low back pain possibly from pathological sacral fractures and not responding to oral pain medications at home.  Still in significant pain.  Palliative care consulted, discussed with Dr. Hilma Favors this morning, appreciate input regarding pain control.  He has been placed on Decadron along with long-acting morphine 30 mg every 12 with some improvement in his pain it appears.  Continue.  Avoid Ativan given confusion last night -Radiation oncology consulted, he started radiation therapy   Active problems Metastatic bladder cancer -With history of prostatectomy, cystectomy with ileal conduit and chemotherapy. -Imaging on presentation revealed multiple areas of  metastases as above.  Oncology following, plan for immunotherapy as an outpatient.  AKI -possibly in the setting of hypotension from narcotics use, NSAIDs.  Hold ARB, kidney function better today  Leukocytosis -Possibly reactive.  WBC now normalized  Anemia of chronic disease -Hemoglobin stable.  No signs of bleeding.   Essential hypertension-hold valsartan today due to acute kidney injury   Hyperlipidemia -Continue simvastatin  GERD -Continue PPI  Scheduled Meds:  Chlorhexidine Gluconate Cloth  6 each Topical Daily   dexamethasone (DECADRON) injection  4 mg Intravenous Q24H   enoxaparin (LOVENOX) injection  40 mg Subcutaneous Q24H   gabapentin  400 mg Oral TID   lidocaine  2 patch Transdermal Q24H   magnesium hydroxide  15 mL Oral QHS   morphine  30 mg Oral Q12H   multivitamin with minerals  1 tablet Oral Daily   pantoprazole  40 mg Oral Daily   senna-docusate  1 tablet Oral BID   simvastatin  20 mg Oral Daily   sodium chloride flush  10-40 mL Intracatheter Q12H   Continuous Infusions: PRN Meds:.acetaminophen **OR** acetaminophen, ALPRAZolam, alum & mag hydroxide-simeth, HYDROmorphone (DILAUDID) injection, sodium chloride flush, traZODone  Diet Orders (From admission, onward)     Start     Ordered   07/09/22 1939  Diet regular Room service appropriate? Yes; Fluid consistency: Thin  Diet effective now       Question Answer Comment  Room service appropriate? Yes   Fluid consistency: Thin      07/09/22 1938            DVT prophylaxis: enoxaparin (LOVENOX) injection 40 mg Start: 07/11/22 1200 SCDs Start: 07/09/2022 1434   Lab Results  Component Value Date   PLT 215  07/10/2022      Code Status: Full Code  Family Communication: no family at bedside   Status is: Inpatient  Remains inpatient appropriate because: uncontrolled pain   Level of care: Med-Surg  Consultants:  Oncology RadOnc Orthopedic surgery   Objective: Vitals:   07/10/22 0813 07/10/22  1214 07/10/22 2111 07/11/22 0309  BP: 99/70 113/62 107/63 108/75  Pulse: 89 96 90 90  Resp:  '20 17 16  '$ Temp:  98.8 F (37.1 C) 98.9 F (37.2 C) 98 F (36.7 C)  TempSrc:  Oral  Oral  SpO2:  90% 94% 93%  Weight:      Height:        Intake/Output Summary (Last 24 hours) at 07/11/2022 1054 Last data filed at 07/10/2022 2116 Gross per 24 hour  Intake 250 ml  Output 300 ml  Net -50 ml    Wt Readings from Last 3 Encounters:  07/15/2022 63.5 kg  06/21/22 71.2 kg  05/30/22 71.2 kg    Examination:  Constitutional: NAD Eyes: lids and conjunctivae normal, no scleral icterus ENMT: mmm Neck: normal, supple Respiratory: clear to auscultation bilaterally, no wheezing, no crackles.  Cardiovascular: Regular rate and rhythm, no murmurs / rubs / gallops. No LE edema. Abdomen: soft, no distention, no tenderness. Bowel sounds positive.  Skin: no rashes Neurologic: no focal deficits, equal strength  Data Reviewed: I have independently reviewed following labs and imaging studies   CBC Recent Labs  Lab 07/02/2022 1251 07/10/22 0429  WBC 12.2* 9.6  HGB 10.8* 8.2*  HCT 31.8* 24.5*  PLT 281 215  MCV 95.8 96.8  MCH 32.5 32.4  MCHC 34.0 33.5  RDW 15.0 15.5  LYMPHSABS 2.8 1.5  MONOABS 0.7 0.7  EOSABS 0.1 0.1  BASOSABS 0.0 0.0     Recent Labs  Lab 07/11/2022 1251 07/10/22 0429 07/11/22 0329  NA 137 138 135  K 4.1 4.3 4.2  CL 104 102 99  CO2 18* 26 26  GLUCOSE 124* 125* 153*  BUN 38* 57* 60*  CREATININE 1.19 1.58* 1.39*  CALCIUM 9.7 9.7 9.5  MG  --  2.6* 2.2  INR 1.1  --   --      ------------------------------------------------------------------------------------------------------------------ No results for input(s): "CHOL", "HDL", "LDLCALC", "TRIG", "CHOLHDL", "LDLDIRECT" in the last 72 hours.  No results found for: "HGBA1C" ------------------------------------------------------------------------------------------------------------------ No results for input(s): "TSH",  "T4TOTAL", "T3FREE", "THYROIDAB" in the last 72 hours.  Invalid input(s): "FREET3"  Cardiac Enzymes No results for input(s): "CKMB", "TROPONINI", "MYOGLOBIN" in the last 168 hours.  Invalid input(s): "CK" ------------------------------------------------------------------------------------------------------------------ No results found for: "BNP"  CBG: No results for input(s): "GLUCAP" in the last 168 hours.  No results found for this or any previous visit (from the past 240 hour(s)).   Radiology Studies: VAS [place] LOWER EXTREMITY VENOUS (DVT)  Result Date: 07/11/2022  Lower Venous DVT Study Patient Name:  Jerome Cruz  Date of Exam:   07/11/2022 Medical Rec #: [id number]        Accession #:    [id number] Date of Birth: 08/28/1956        Patient Gender: M Patient Age:   25 years Exam Location:  [facility] Procedure:      VAS [place] LOWER EXTREMITY VENOUS (DVT) Referring Phys: Marzetta Board --------------------------------------------------------------------------------  Indications: Swelling.  Risk Factors: Cancer. Comparison Study: No prior studies. Performing Technologist: Oliver Hum RVT  Examination Guidelines: A complete evaluation includes B-mode imaging, spectral Doppler, color Doppler, and power Doppler as needed of all accessible portions  of each vessel. Bilateral testing is considered an integral part of a complete examination. Limited examinations for reoccurring indications may be performed as noted. The reflux portion of the exam is performed with the patient in reverse Trendelenburg.  +---------+---------------+---------+-----------+----------+--------------+ RIGHT    CompressibilityPhasicitySpontaneityPropertiesThrombus Aging +---------+---------------+---------+-----------+----------+--------------+ CFV      Full           Yes      Yes                                 +---------+---------------+---------+-----------+----------+--------------+ SFJ      Full                                                         +---------+---------------+---------+-----------+----------+--------------+ FV Prox  Full                                                        +---------+---------------+---------+-----------+----------+--------------+ FV Mid   Full                                                        +---------+---------------+---------+-----------+----------+--------------+ FV DistalFull                                                        +---------+---------------+---------+-----------+----------+--------------+ PFV      Full                                                        +---------+---------------+---------+-----------+----------+--------------+ POP      Full           Yes      Yes                                 +---------+---------------+---------+-----------+----------+--------------+ PTV      Full                                                        +---------+---------------+---------+-----------+----------+--------------+ PERO     Full                                                        +---------+---------------+---------+-----------+----------+--------------+   +---------+---------------+---------+-----------+----------+--------------+ LEFT  CompressibilityPhasicitySpontaneityPropertiesThrombus Aging +---------+---------------+---------+-----------+----------+--------------+ CFV      Full           Yes      Yes                                 +---------+---------------+---------+-----------+----------+--------------+ SFJ      Full                                                        +---------+---------------+---------+-----------+----------+--------------+ FV Prox  Full                                                        +---------+---------------+---------+-----------+----------+--------------+ FV Mid   Full                                                         +---------+---------------+---------+-----------+----------+--------------+ FV DistalFull                                                        +---------+---------------+---------+-----------+----------+--------------+ PFV      Full                                                        +---------+---------------+---------+-----------+----------+--------------+ POP      Full           Yes      Yes                                 +---------+---------------+---------+-----------+----------+--------------+ PTV      Full                                                        +---------+---------------+---------+-----------+----------+--------------+ PERO     Full                                                        +---------+---------------+---------+-----------+----------+--------------+     Summary: RIGHT: - There is no evidence of deep vein thrombosis in the lower extremity.  - No cystic structure found in the popliteal fossa.  LEFT: - There is no evidence of deep vein thrombosis in the lower extremity.  - No cystic structure found in the  popliteal fossa.  *See table(s) above for measurements and observations.    Preliminary      Marzetta Board, MD, PhD [facility]  Between 7 am - 7 pm I am available, please contact me via Amion (for emergencies) or Securechat (non urgent messages)  Between 7 pm - 7 am I am not available, please contact night coverage MD/APP via Amion

## 2022-07-11 NOTE — TOC Initial Note (Signed)
Transition of Care [facility]) - Initial/Assessment Note    Patient Details  Name: Jerome Cruz MRN: [id number] Date of Birth: February 11, 1956  Transition of [facility]) CM/SW Contact:    Vassie Moselle, LCSW Phone Number: 07/11/2022, 9:45 AM  Clinical Narrative:                 TOC following for recommendations and discharge planning for this patient.   Expected Discharge Plan: Home/Self Care Barriers to Discharge: Continued Medical Work up   Patient Goals and CMS Choice Patient states their goals for this hospitalization and ongoing recovery are:: To go home      Expected Discharge Plan and Services Expected Discharge Plan: Home/Self Care In-house Referral: Hospice / [place] arrangements for the past 2 months: Single Family Home                 DME Arranged: N/A DME Agency: NA                  Prior Living Arrangements/Services Living arrangements for the past 2 months: Single Family Home Lives with:: Spouse Patient language and need for interpreter reviewed:: Yes Do you feel safe going back to the place where you live?: Yes      Need for Family Participation in Patient Care: No (Comment) Care giver support system in place?: No (comment)   Criminal Activity/Legal Involvement Pertinent to Current Situation/Hospitalization: No - Comment as needed  Activities of Daily Living Home Assistive Devices/Equipment: Eyeglasses ADL Screening (condition at time of admission) Patient's cognitive ability adequate to safely complete daily activities?: Yes Is the patient deaf or have difficulty hearing?: No Does the patient have difficulty seeing, even when wearing glasses/contacts?: No Does the patient have difficulty concentrating, remembering, or making decisions?: No Patient able to express need for assistance with ADLs?: Yes Does the patient have difficulty dressing or bathing?: No Independently performs ADLs?: Yes (appropriate for developmental  age) Does the patient have difficulty walking or climbing stairs?: Yes Weakness of Legs: Both (left is worse than right) Weakness of Arms/Hands: None  Permission Sought/Granted                  Emotional Assessment       Orientation: : Oriented to Self, Oriented to Place, Oriented to  Time, Oriented to Situation Alcohol / Substance Use: Not Applicable Psych Involvement: No (comment)  Admission diagnosis:  Bladder carcinoma metastatic to lung ([place]) [C67.9, C78.00] Intractable pain [R52] Intractable low back pain [M54.59] Pathological fracture with delayed healing, unspecified fracture site, unspecified pathological cause, subsequent encounter [M84.40XG] Patient Active Problem List   Diagnosis Date Noted   Protein-calorie malnutrition, severe 07/10/2022   Leukocytosis 07/09/2022   Intractable pain 07/09/2022   Metastasis to bone ([place]) 07/09/2022   Intractable low back pain 07/09/2022   Normocytic anemia 07/17/2022   Hyperlipidemia 07/12/2022   Port-A-Cath in place 10/30/2021   Bladder cancer ([place]) 10/17/2021   Malignant neoplasm of urinary bladder ([place]) 10/17/2021   Foot pain 04/20/2012   General medical examination 03/06/2012   Essential hypertension 09/27/2008   GERD 09/27/2008   PCP:  [name], MD Pharmacy:   [facility] [phone], [address] AT [facility] [address] [place] [address] Phone: [phone] Fax: [phone]     Social Determinants of Health (SDOH) Interventions    Readmission Risk Interventions    07/11/2022    9:42  AM  Readmission Risk Prevention Plan  Post Dischage Appt Complete  Medication Screening Complete  Transportation Screening Complete

## 2022-07-12 ENCOUNTER — Telehealth: Payer: Self-pay | Admitting: Oncology

## 2022-07-12 ENCOUNTER — Ambulatory Visit
Admit: 2022-07-12 | Discharge: 2022-07-12 | Disposition: A | Discharge status: Home / Self Care | Payer: BC Managed Care – PPO | Attending: Radiation Oncology | Admitting: Radiation Oncology

## 2022-07-12 ENCOUNTER — Other Ambulatory Visit: Payer: Self-pay

## 2022-07-12 ENCOUNTER — Encounter: Payer: Self-pay | Admitting: Urology

## 2022-07-12 ENCOUNTER — Inpatient Hospital Stay (HOSPITAL_COMMUNITY): Payer: BC Managed Care – PPO

## 2022-07-12 DIAGNOSIS — M5459 Other low back pain: Secondary | ICD-10-CM | POA: Diagnosis not present

## 2022-07-12 LAB — RAD ONC ARIA SESSION SUMMARY
Course Elapsed Days: 2
Plan Fractions Treated to Date: 3
Plan Prescribed Dose Per Fraction: 3 Gy
Plan Total Fractions Prescribed: 10
Plan Total Prescribed Dose: 30 Gy
Reference Point Dosage Given to Date: 9 Gy
Reference Point Session Dosage Given: 3 Gy
Session Number: 3

## 2022-07-12 LAB — BASIC METABOLIC PANEL
Anion gap: 13 (ref 5–15)
BUN: 64 mg/dL — ABNORMAL HIGH (ref 8–23)
CO2: 29 mmol/L (ref 22–32)
Calcium: 9.7 mg/dL (ref 8.9–10.3)
Chloride: 95 mmol/L — ABNORMAL LOW (ref 98–111)
Creatinine, Ser: 1.33 mg/dL — ABNORMAL HIGH (ref 0.61–1.24)
GFR, Estimated: >60 mL/min (ref >60)
Glucose, Bld: 123 mg/dL — ABNORMAL HIGH (ref 70–99)
Potassium: 4.1 mmol/L (ref 3.5–5.1)
Sodium: 137 mmol/L (ref 135–145)

## 2022-07-12 MED ORDER — LIDOCAINE VISCOUS HCL 2 % MT SOLN
15.0000 mL | Freq: Once | OROMUCOSAL | Status: AC
  Administered 2022-07-12: 15 mL via OROMUCOSAL
  Filled 2022-07-12: qty 15

## 2022-07-12 MED ORDER — METOCLOPRAMIDE HCL 5 MG PO TABS: 5.0000 mg | ORAL_TABLET | Freq: Three times a day (TID) | ORAL | Status: DC

## 2022-07-12 MED ORDER — ONDANSETRON HCL 4 MG/2ML IJ SOLN
4.0000 mg | Freq: Once | INTRAMUSCULAR | Status: AC
Start: 2022-07-12 — End: 2022-07-12
  Administered 2022-07-12: 4 mg via INTRAVENOUS
  Filled 2022-07-12: qty 2

## 2022-07-12 MED ORDER — POLYETHYLENE GLYCOL 3350 17 G PO PACK
17.0000 g | PACK | Freq: Every day | ORAL | Status: DC
  Administered 2022-07-12: 17 g via ORAL
  Filled 2022-07-12: qty 1

## 2022-07-12 MED ORDER — ONDANSETRON HCL 4 MG/2ML IJ SOLN
4.0000 mg | Freq: Four times a day (QID) | INTRAMUSCULAR | Status: DC | PRN
  Administered 2022-07-12 – 2022-07-13 (×3): 4 mg via INTRAVENOUS
  Filled 2022-07-12 (×3): qty 2

## 2022-07-12 MED ORDER — METOCLOPRAMIDE HCL 5 MG/ML IJ SOLN
5.0000 mg | Freq: Three times a day (TID) | INTRAMUSCULAR | Status: DC
  Administered 2022-07-12 – 2022-07-13 (×3): 5 mg via INTRAVENOUS
  Filled 2022-07-12 (×3): qty 2

## 2022-07-12 MED ORDER — SENNOSIDES-DOCUSATE SODIUM 8.6-50 MG PO TABS
2.0000 | ORAL_TABLET | Freq: Two times a day (BID) | ORAL | Status: DC
  Filled 2022-07-12: qty 2

## 2022-07-12 NOTE — Progress Notes (Signed)
PROGRESS NOTE  Jerome Cruz WVP:[id number] DOB: October 03, 1956 DOA: 07/03/2022 PCP: [name], MD   LOS: 3 days   Brief Narrative / Interim history: 66 y.o. male with medical history significant of bladder adenocarcinoma with a history of prostatectomy, cystectomy with ileal conduit, ADD, GERD, hyperlipidemia, hypertension who was referred from the cancer center due to persistent lower back pain despite taking oxycodone after having a fall last month that resulted in pathological sacral fracture.  On presentation, CT of abdomen and pelvis with contrast showed interval appearance of multiple nodules in both lungs as well as liver along with multiple new soft tissue masses in the pelvic cavity.  Orthopedic/oncology were consulted.  Subjective / 24h Interval events: He is complaining now of significant nausea, weakness, as well as loose bowel movements  Assesement and Plan: Principal Problem:   Intractable low back pain Active Problems:   Essential hypertension   GERD   Malignant neoplasm of urinary bladder (HCC)   Normocytic anemia   Hyperlipidemia   Leukocytosis   Intractable pain   Protein-calorie malnutrition, severe   Principal problem Intractable low back pain, pathological sacral fractures - Presented with intractable low back pain possibly from pathological sacral fractures and not responding to oral pain medications at home.  He was placed on long-acting morphine with improvement in his pain.  Continue.  Increase bowel regimen due to ileus as below -Radiation oncology consulted, he started radiation therapy   Active problems Nausea, vomiting, possible ileus-increased over the last 24 hours.  Abdominal x-ray this morning does show an ileus versus early bowel obstruction.  Probably due to narcotics.  Increase bowel regimen  Metastatic bladder cancer -With history of prostatectomy, cystectomy with ileal conduit and chemotherapy. -Imaging on presentation revealed multiple areas  of metastases as above.  Oncology following, plan for immunotherapy as an outpatient.  AKI -possibly in the setting of hypotension from narcotics use, NSAIDs.  Hold ARB, kidney function has stabilized  Leukocytosis -Possibly reactive.  WBC now normalized  Anemia of chronic disease -Hemoglobin stable.  No signs of bleeding.   Essential hypertension-hold valsartan today due to acute kidney injury   Hyperlipidemia -Continue simvastatin  GERD -Continue PPI  Scheduled Meds:  Chlorhexidine Gluconate Cloth  6 each Topical Daily   dexamethasone (DECADRON) injection  4 mg Intravenous Q24H   enoxaparin (LOVENOX) injection  40 mg Subcutaneous Q24H   gabapentin  400 mg Oral TID   lidocaine  2 patch Transdermal Q24H   magnesium hydroxide  15 mL Oral QHS   morphine  30 mg Oral Q12H   multivitamin with minerals  1 tablet Oral Daily   pantoprazole  40 mg Oral Daily   senna-docusate  1 tablet Oral BID   simvastatin  20 mg Oral Daily   sodium chloride flush  10-40 mL Intracatheter Q12H   Continuous Infusions: PRN Meds:.acetaminophen **OR** acetaminophen, ALPRAZolam, alum & mag hydroxide-simeth, HYDROmorphone (DILAUDID) injection, ondansetron (ZOFRAN) IV, sodium chloride flush, traZODone  Diet Orders (From admission, onward)     Start     Ordered   07/09/22 1939  Diet regular Room service appropriate? Yes; Fluid consistency: Thin  Diet effective now       Question Answer Comment  Room service appropriate? Yes   Fluid consistency: Thin      07/09/22 1938            DVT prophylaxis: enoxaparin (LOVENOX) injection 40 mg Start: 07/11/22 1200 SCDs Start: 07/15/2022 1434   Lab Results  Component Value Date  PLT 215 07/10/2022      Code Status: Full Code  Family Communication: no family at bedside   Status is: Inpatient  Remains inpatient appropriate because: uncontrolled pain   Level of care: Med-Surg  Consultants:  Oncology RadOnc Orthopedic surgery    Objective: Vitals:   07/11/22 1928 07/12/22 0044 07/12/22 0100 07/12/22 0402  BP: 120/71 (!) 144/56  126/75  Pulse: 94 (!) 110 89 (!) 105  Resp: '20 18  18  '$ Temp: 98.6 F (37 C) 97.9 F (36.6 C)  97.9 F (36.6 C)  TempSrc: Oral Oral    SpO2: 95% 99%  94%  Weight:      Height:        Intake/Output Summary (Last 24 hours) at 07/12/2022 1121 Last data filed at 07/12/2022 0044 Gross per 24 hour  Intake --  Output 550 ml  Net -550 ml    Wt Readings from Last 3 Encounters:  07/16/2022 63.5 kg  06/21/22 71.2 kg  05/30/22 71.2 kg    Examination:  Constitutional: NAD Eyes: lids and conjunctivae normal, no scleral icterus ENMT: mmm Neck: normal, supple Respiratory: clear to auscultation bilaterally, no wheezing, no crackles.  Cardiovascular: Regular rate and rhythm, no murmurs / rubs / gallops. Abdomen: soft, no distention, no tenderness. Bowel sounds positive.  Skin: no rashes Neurologic: no focal deficits, equal strength  Data Reviewed: I have independently reviewed following labs and imaging studies   CBC Recent Labs  Lab 07/09/2022 1251 07/10/22 0429  WBC 12.2* 9.6  HGB 10.8* 8.2*  HCT 31.8* 24.5*  PLT 281 215  MCV 95.8 96.8  MCH 32.5 32.4  MCHC 34.0 33.5  RDW 15.0 15.5  LYMPHSABS 2.8 1.5  MONOABS 0.7 0.7  EOSABS 0.1 0.1  BASOSABS 0.0 0.0     Recent Labs  Lab 07/18/2022 1251 07/10/22 0429 07/11/22 0329 07/12/22 0327  NA 137 138 135 137  K 4.1 4.3 4.2 4.1  CL 104 102 99 95*  CO2 18* '26 26 29  '$ GLUCOSE 124* 125* 153* 123*  BUN 38* 57* 60* 64*  CREATININE 1.19 1.58* 1.39* 1.33*  CALCIUM 9.7 9.7 9.5 9.7  MG  --  2.6* 2.2  --   INR 1.1  --   --   --      ------------------------------------------------------------------------------------------------------------------ No results for input(s): "CHOL", "HDL", "LDLCALC", "TRIG", "CHOLHDL", "LDLDIRECT" in the last 72 hours.  No results found for:  "HGBA1C" ------------------------------------------------------------------------------------------------------------------ No results for input(s): "TSH", "T4TOTAL", "T3FREE", "THYROIDAB" in the last 72 hours.  Invalid input(s): "FREET3"  Cardiac Enzymes No results for input(s): "CKMB", "TROPONINI", "MYOGLOBIN" in the last 168 hours.  Invalid input(s): "CK" ------------------------------------------------------------------------------------------------------------------ No results found for: "BNP"  CBG: No results for input(s): "GLUCAP" in the last 168 hours.  No results found for this or any previous visit (from the past 240 hour(s)).   Radiology Studies: DG Abd Portable 1V  Result Date: 07/12/2022 CLINICAL DATA:  Nausea EXAM: PORTABLE ABDOMEN - 1 VIEW COMPARISON:  CT abdomen pelvis 07/05/2022 FINDINGS: Interval development of diffuse small bowel dilatation which may be due to obstruction or ileus. No suspicious calcifications. IMPRESSION: Interval development of small bowel dilatation which may be due to ileus or obstruction. Electronically Signed   By: Miachel Roux M.D.   On: 07/12/2022 09:48     Marzetta Board, MD, PhD [facility]  Between 7 am - 7 pm I am available, please contact me via Amion (for emergencies) or Securechat (non urgent messages)  Between 7 pm - 7  am I am not available, please contact night coverage MD/APP via Amion

## 2022-07-12 NOTE — Progress Notes (Addendum)
Patient was found sitting on the floor after using the bathroom and stated that he felt weak after empty his urostomy bag as usual and pulled call light for assistance in the bathroom. Patient is alert and oriented x 4. No pain complained at this time . NT assisted patient back to bed, patient walked well. RN assessed patient and vital signs was taken and no injury observed. Paged NP on call as well.  Bed alarm set up, floor matts placed and door opens to monitor patient for safety. Post fall huddle was performed, safety-zone for near miss/fall was done as well.

## 2022-07-12 NOTE — Plan of Care (Signed)

## 2022-07-12 NOTE — Progress Notes (Signed)
Patient XY:BFXOVAN ARLIE RIKER      DOB: 1956-11-01      VBT:[id number]      Palliative Medicine Team   Patient down in radiation at time of visit attempt. This RN connected with bedside RN Estill Bamberg, who endorses patient has had ongoing nausea and hiccups this shift. He denied pain for her prior to radiation treatment today, tylenol given to ease comfort with radiation table. Xray and labs reviewed. MD Hilma Favors made aware of ongoing symptoms present, to adjust plan accordingly.   Will continue to follow along for changes or advances, will attempt to revisit later when he is back to unit.       Damian Leavell, MSN, RN Palliative Medicine Team Team Phone: [phone]  This phone is monitored 7a-7p, please reach out to attending physician outside of these hours for urgent needs.

## 2022-07-12 NOTE — Telephone Encounter (Signed)
Called patient regarding upcoming appointments, left a voicemail. 

## 2022-07-13 ENCOUNTER — Inpatient Hospital Stay (HOSPITAL_COMMUNITY): Payer: BC Managed Care – PPO

## 2022-07-13 DIAGNOSIS — C7800 Secondary malignant neoplasm of unspecified lung: Secondary | ICD-10-CM

## 2022-07-13 DIAGNOSIS — K921 Melena: Secondary | ICD-10-CM | POA: Diagnosis not present

## 2022-07-13 DIAGNOSIS — M5459 Other low back pain: Secondary | ICD-10-CM | POA: Diagnosis not present

## 2022-07-13 DIAGNOSIS — I10 Essential (primary) hypertension: Secondary | ICD-10-CM | POA: Diagnosis not present

## 2022-07-13 DIAGNOSIS — K559 Vascular disorder of intestine, unspecified: Secondary | ICD-10-CM

## 2022-07-13 DIAGNOSIS — C679 Malignant neoplasm of bladder, unspecified: Secondary | ICD-10-CM | POA: Diagnosis not present

## 2022-07-13 LAB — BASIC METABOLIC PANEL
Anion gap: 12 (ref 5–15)
BUN: 93 mg/dL — ABNORMAL HIGH (ref 8–23)
CO2: 31 mmol/L (ref 22–32)
Calcium: 9.4 mg/dL (ref 8.9–10.3)
Chloride: 88 mmol/L — ABNORMAL LOW (ref 98–111)
Creatinine, Ser: 2.15 mg/dL — ABNORMAL HIGH (ref 0.61–1.24)
GFR, Estimated: 34 mL/min — ABNORMAL LOW (ref >60)
Glucose, Bld: 133 mg/dL — ABNORMAL HIGH (ref 70–99)
Potassium: 4.2 mmol/L (ref 3.5–5.1)
Sodium: 131 mmol/L — ABNORMAL LOW (ref 135–145)

## 2022-07-13 LAB — HEPATIC FUNCTION PANEL
ALT: 26 U/L (ref 0–44)
AST: 21 U/L (ref 15–41)
Albumin: 2.4 g/dL — ABNORMAL LOW (ref 3.5–5.0)
Alkaline Phosphatase: 48 U/L (ref 38–126)
Bilirubin, Direct: 0.1 mg/dL (ref 0.0–0.2)
Indirect Bilirubin: 0.4 mg/dL (ref 0.3–0.9)
Total Bilirubin: 0.5 mg/dL (ref 0.3–1.2)
Total Protein: 6 g/dL — ABNORMAL LOW (ref 6.5–8.1)

## 2022-07-13 LAB — CBC
HCT: 20.5 % — ABNORMAL LOW (ref 39.0–52.0)
HCT: 20.9 % — ABNORMAL LOW (ref 39.0–52.0)
Hemoglobin: 7.1 g/dL — ABNORMAL LOW (ref 13.0–17.0)
Hemoglobin: 7.3 g/dL — ABNORMAL LOW (ref 13.0–17.0)
MCH: 32.9 pg (ref 26.0–34.0)
MCH: 32.6 pg (ref 26.0–34.0)
MCHC: 34.6 g/dL (ref 30.0–36.0)
MCHC: 34.9 g/dL (ref 30.0–36.0)
MCV: 94.9 fL (ref 80.0–100.0)
MCV: 93.3 fL (ref 80.0–100.0)
Platelets: 223 10*3/uL (ref 150–400)
Platelets: 246 10*3/uL (ref 150–400)
RBC: 2.16 MIL/uL — ABNORMAL LOW (ref 4.22–5.81)
RBC: 2.24 MIL/uL — ABNORMAL LOW (ref 4.22–5.81)
RDW: 15.4 % (ref 11.5–15.5)
RDW: 15.8 % — ABNORMAL HIGH (ref 11.5–15.5)
WBC: 6.7 10*3/uL (ref 4.0–10.5)
WBC: 2 10*3/uL — ABNORMAL LOW (ref 4.0–10.5)
nRBC: 0 % (ref 0.0–0.2)
nRBC: 0 % (ref 0.0–0.2)

## 2022-07-13 LAB — LACTIC ACID, PLASMA
Lactic Acid, Venous: 2.1 mmol/L (ref 0.5–1.9)
Lactic Acid, Venous: 2.1 mmol/L (ref 0.5–1.9)

## 2022-07-13 LAB — PROTIME-INR
INR: 1.2 (ref 0.8–1.2)
Prothrombin Time: 15.4 seconds — ABNORMAL HIGH (ref 11.4–15.2)

## 2022-07-13 LAB — OCCULT BLOOD X 1 CARD TO LAB, STOOL: Fecal Occult Bld: POSITIVE — AB

## 2022-07-13 LAB — PREPARE RBC (CROSSMATCH)

## 2022-07-13 MED ORDER — SODIUM CHLORIDE 0.9 % IV SOLN: INTRAVENOUS | Status: DC | PRN

## 2022-07-13 MED ORDER — SODIUM CHLORIDE 0.9 % IV SOLN
0.3000 mg/kg/h | INTRAVENOUS | Status: DC
  Administered 2022-07-13: 0.3 mg/kg/h via INTRAVENOUS
  Filled 2022-07-13: qty 10

## 2022-07-13 MED ORDER — FENTANYL 12 MCG/HR TD PT72
1.0000 | MEDICATED_PATCH | TRANSDERMAL | Status: DC
  Administered 2022-07-13: 1 via TRANSDERMAL
  Filled 2022-07-13: qty 1

## 2022-07-13 MED ORDER — PROCHLORPERAZINE EDISYLATE 10 MG/2ML IJ SOLN
5.0000 mg | Freq: Once | INTRAMUSCULAR | Status: AC
  Administered 2022-07-13: 5 mg via INTRAVENOUS
  Filled 2022-07-13: qty 2

## 2022-07-13 MED ORDER — PANTOPRAZOLE SODIUM 40 MG IV SOLR
40.0000 mg | Freq: Two times a day (BID) | INTRAVENOUS | Status: DC
  Administered 2022-07-13: 40 mg via INTRAVENOUS
  Filled 2022-07-13: qty 10

## 2022-07-13 MED ORDER — HYDROMORPHONE HCL 1 MG/ML IJ SOLN
1.0000 mg | INTRAMUSCULAR | Status: DC | PRN
  Filled 2022-07-13: qty 1

## 2022-07-13 MED ORDER — SODIUM CHLORIDE 0.9 % IV SOLN
12.5000 mg | Freq: Once | INTRAVENOUS | Status: AC
  Administered 2022-07-13: 12.5 mg via INTRAVENOUS
  Filled 2022-07-13: qty 0.5

## 2022-07-13 MED ORDER — SODIUM CHLORIDE 0.9 % IV BOLUS
250.0000 mL | Freq: Once | INTRAVENOUS | Status: AC
  Administered 2022-07-13: 250 mL via INTRAVENOUS

## 2022-07-13 MED ORDER — SODIUM CHLORIDE 0.9 % IV SOLN: INTRAVENOUS | Status: DC

## 2022-07-13 MED ORDER — KETAMINE BOLUS VIA INFUSION: 0.5000 mg/kg | INTRAVENOUS | Status: DC | PRN

## 2022-07-13 MED ORDER — PIPERACILLIN-TAZOBACTAM 3.375 G IVPB
3.3750 g | Freq: Three times a day (TID) | INTRAVENOUS | Status: DC
  Administered 2022-07-13: 3.375 g via INTRAVENOUS
  Filled 2022-07-13 (×2): qty 50

## 2022-07-13 MED ORDER — OCTREOTIDE ACETATE 500 MCG/ML IJ SOLN
50.0000 ug/h | INTRAMUSCULAR | Status: DC
  Administered 2022-07-13: 50 ug/h via INTRAVENOUS
  Filled 2022-07-13 (×3): qty 1

## 2022-07-13 MED ORDER — HALOPERIDOL LACTATE 5 MG/ML IJ SOLN
5.0000 mg | Freq: Four times a day (QID) | INTRAMUSCULAR | Status: DC | PRN
Start: 2022-07-13 — End: 2022-07-14

## 2022-07-13 MED ORDER — SODIUM CHLORIDE 0.9% IV SOLUTION: Freq: Once | INTRAVENOUS | Status: DC

## 2022-07-13 NOTE — Progress Notes (Addendum)
PROGRESS NOTE  ARMEN WARING QBH:[id number] DOB: 1956-07-07 DOA: 07/18/2022 PCP: [name], MD   LOS: 4 days   Brief Narrative / Interim history: 66 y.o. male with medical history significant of bladder adenocarcinoma with a history of prostatectomy, cystectomy with ileal conduit, ADD, GERD, hyperlipidemia, hypertension who was referred from the cancer center due to persistent lower back pain despite taking oxycodone after having a fall last month that resulted in pathological sacral fracture.  On presentation, CT of abdomen and pelvis with contrast showed interval appearance of multiple nodules in both lungs as well as liver along with multiple new soft tissue masses in the pelvic cavity.  Orthopedic/oncology were consulted.  Subjective / 24h Interval events: Complains of persistent nausea this morning as well as incessant hiccups  Assesement and Plan: Principal Problem:   Intractable low back pain Active Problems:   Essential hypertension   GERD   Malignant neoplasm of urinary bladder (HCC)   Normocytic anemia   Hyperlipidemia   Leukocytosis   Intractable pain   Protein-calorie malnutrition, severe   Principal problem Intractable low back pain, pathological sacral fractures - Presented with intractable low back pain possibly from pathological sacral fractures and not responding to oral pain medications at home.  Palliative care consulted, discussed with Dr. Hilma Favors today.  Appreciate input.  He was initially placed on long-acting morphine however with his worsening renal function this will be transitioned to fentanyl patch today.  Overall pain level has improved -Radiation oncology consulted, he started radiation therapy past Wednesday   Active problems Nausea, vomiting due to ileus, new onset melena-patient has been having increased nausea and vomiting over the last 48 hours.  On abdominal x-ray on 7/21 shows ileus.  He has been having bowel movements, and today he has  worsening diarrhea and his stools have become melanotic.  Fecal occult is positive.  In addition, his hemoglobin dropped to 7.1.  This may suggest an upper GI bleed.  On review of the CT scan done on 7/17 there was a fluid density in the lumen of the lower thoracic esophagus.  Unclear etiology.  GI consulted today, appreciate input  Hyponatremia-possibly dehydrated.  IV fluids  Acute blood loss anemia-likely due to GI bleed.  Transfuse unit of packed red blood cells today.  He has a degree of multifactorial anemia in the setting of chronic illness, underlying malignancy and prior chemotherapy.  Metastatic bladder cancer -With history of prostatectomy, cystectomy with ileal conduit and chemotherapy. -Imaging on presentation revealed multiple areas of metastases as above.  Oncology following, plan for immunotherapy as an outpatient.  AKI -worsening today.  Creatinine 2.1.  This is multifactorial, he has received IV contrast 5 days ago which may suggest CIN, has received NSAID as well as narcotics and potentially had episodes of hypotension.  His ARB was discontinued past Wednesday.  Obtain a CT scan of the abdomen and pelvis as above, which will evaluate the kidneys as well.  IV fluids today  Leukocytosis -Possibly reactive.  WBC now normalized  Essential hypertension-continue to hold valsartan due to soft blood pressures   Hyperlipidemia -Continue simvastatin  GERD -Continue PPI  Scheduled Meds:  sodium chloride   Intravenous Once   Chlorhexidine Gluconate Cloth  6 each Topical Daily   enoxaparin (LOVENOX) injection  40 mg Subcutaneous Q24H   fentaNYL  1 patch Transdermal Q72H   gabapentin  400 mg Oral TID   lidocaine  2 patch Transdermal Q24H   multivitamin with minerals  1 tablet Oral  Daily   pantoprazole (PROTONIX) IV  40 mg Intravenous Q12H   polyethylene glycol  17 g Oral Daily   senna-docusate  2 tablet Oral BID   simvastatin  20 mg Oral Daily   sodium chloride flush  10-40 mL  Intracatheter Q12H   Continuous Infusions:  sodium chloride 100 mL/hr at 07/04/2022 1030   chlorproMAZINE (THORAZINE) 12.5 mg in sodium chloride 0.9 % 25 mL IVPB     PRN Meds:.acetaminophen **OR** acetaminophen, ALPRAZolam, alum & mag hydroxide-simeth, HYDROmorphone (DILAUDID) injection, ondansetron (ZOFRAN) IV, sodium chloride flush, traZODone  Diet Orders (From admission, onward)     Start     Ordered   07/09/22 1939  Diet regular Room service appropriate? Yes; Fluid consistency: Thin  Diet effective now       Question Answer Comment  Room service appropriate? Yes   Fluid consistency: Thin      07/09/22 1938            DVT prophylaxis: enoxaparin (LOVENOX) injection 40 mg Start: 07/11/22 1200 SCDs Start: 07/21/2022 1434   Lab Results  Component Value Date   PLT 223 07/04/2022      Code Status: Full Code  Family Communication: Wife updated at bedside  Status is: Inpatient  Remains inpatient appropriate because: New onset GI bleed   Level of care: Med-Surg  Consultants:  Oncology RadOnc Orthopedic surgery   Objective: Vitals:   07/12/22 1345 07/12/22 2028 07/12/22 2355 06/29/2022 0609  BP: 105/64 115/65 114/65 (!) 97/58  Pulse: 85 97 (!) 102 99  Resp: '14 18 20 18  '$ Temp: 98.2 F (36.8 C) 98.2 F (36.8 C)  98 F (36.7 C)  TempSrc:  Oral  Oral  SpO2: 95% 96% 94% 95%  Weight:      Height:        Intake/Output Summary (Last 24 hours) at 07/19/2022 1228 Last data filed at 07/17/2022 0911 Gross per 24 hour  Intake 10 ml  Output 600 ml  Net -590 ml    Wt Readings from Last 3 Encounters:  07/02/2022 63.5 kg  06/21/22 71.2 kg  05/30/22 71.2 kg    Examination:  Constitutional: Pale appearing Eyes: lids and conjunctivae normal, no scleral icterus ENMT: mmm Neck: normal, supple Respiratory: clear to auscultation bilaterally, no wheezing, no crackles. Cardiovascular: Regular rate and rhythm, no murmurs / rubs / gallops. No LE edema. Abdomen: soft, no  distention, no tenderness. Bowel sounds positive.  Skin: no rashes Neurologic: no focal deficits, equal strength  Data Reviewed: I have independently reviewed following labs and imaging studies   CBC Recent Labs  Lab 06/28/2022 1251 07/10/22 0429 07/03/2022 0336  WBC 12.2* 9.6 6.7  HGB 10.8* 8.2* 7.1*  HCT 31.8* 24.5* 20.5*  PLT 281 215 223  MCV 95.8 96.8 94.9  MCH 32.5 32.4 32.9  MCHC 34.0 33.5 34.6  RDW 15.0 15.5 15.4  LYMPHSABS 2.8 1.5  --   MONOABS 0.7 0.7  --   EOSABS 0.1 0.1  --   BASOSABS 0.0 0.0  --      Recent Labs  Lab 07/21/2022 1251 07/10/22 0429 07/11/22 0329 07/12/22 0327 06/28/2022 0336  NA 137 138 135 137 131*  K 4.1 4.3 4.2 4.1 4.2  CL 104 102 99 95* 88*  CO2 18* '26 26 29 31  '$ GLUCOSE 124* 125* 153* 123* 133*  BUN 38* 57* 60* 64* 93*  CREATININE 1.19 1.58* 1.39* 1.33* 2.15*  CALCIUM 9.7 9.7 9.5 9.7 9.4  MG  --  2.6*  2.2  --   --   INR 1.1  --   --   --   --      ------------------------------------------------------------------------------------------------------------------ No results for input(s): "CHOL", "HDL", "LDLCALC", "TRIG", "CHOLHDL", "LDLDIRECT" in the last 72 hours.  No results found for: "HGBA1C" ------------------------------------------------------------------------------------------------------------------ No results for input(s): "TSH", "T4TOTAL", "T3FREE", "THYROIDAB" in the last 72 hours.  Invalid input(s): "FREET3"  Cardiac Enzymes No results for input(s): "CKMB", "TROPONINI", "MYOGLOBIN" in the last 168 hours.  Invalid input(s): "CK" ------------------------------------------------------------------------------------------------------------------ No results found for: "BNP"  CBG: No results for input(s): "GLUCAP" in the last 168 hours.  No results found for this or any previous visit (from the past 240 hour(s)).   Radiology Studies: No results found.   Marzetta Board, MD, PhD [facility]  Between 7 am - 7 pm  I am available, please contact me via Amion (for emergencies) or Securechat (non urgent messages)  Between 7 pm - 7 am I am not available, please contact night coverage MD/APP via Amion

## 2022-07-13 NOTE — Progress Notes (Signed)
At 2326-03-25 patient monitor alarmed for an asystole rhythm. RN went to room and listened with another RN to confirm no heart sounds. Time of Death pronounced at Mar 25, 2326. [facility] called and patient is a candidate for eye donation. Medical examiner called due to recent fall, [facility] declined case due to presence of active cancer most likely related to cause of death. Eye prep done on patient and post mortem care completed. Patient placement contacted and patient transported to the morgue.

## 2022-07-13 NOTE — Plan of Care (Signed)

## 2022-07-13 NOTE — Progress Notes (Signed)
PHARMACY NOTE -  Jerome Cruz has been assisting with dosing of Zosyn for IAI. Dosage remains stable at 3.375 g IV q8 hr and further renal adjustments per institutional Pharmacy antibiotic protocol  Pharmacy will sign off, following peripherally for culture results or dose adjustments. Please reconsult if a change in clinical status warrants re-evaluation of dosage.  Reuel Boom, PharmD, BCPS [phone] 07/01/2022, 4:52 PM

## 2022-07-13 NOTE — Progress Notes (Signed)
Patient declined throughout shift, physicians  Shellia Carwin, and [place] closely incolved in care at bedside.  At 0800 pt noticed to have dark tarry stools that stained bed pad red.  Stool sample collected and MD sent secure chat with findings and correlated drop in hgb;received orders for occult stool, sent sample to lab.  Pt was nauseated and had very dark emesis consecutively at this time.  N/V/D, abd pain managed with zofran and dilaudid per [facility].  6945 pt was brought down to CT.  1500 NG tube insertion completed with 836ms output within an hour.   Family arrived at bedside and spoke with surgeon, together they decided not to operate.  Palliative care consulted with wife and patient at bedside, DNR signed.  1640 blood transfusion started.  Initial VS 109/70, 108bpm, 98.8 oral temp, 94% on 6L, pt drowsy (recent Dilaudid admin) but able to arouse. 1655 18mlood admin VS 103/56, 115bpm, 101.9 axillary temp, 93% on 6L.  This RN immediately stopped infusion.  Reached out to attending physician.  Dr GoHilma Favorsas in the room at the time felt that d/t patient condition and there being other reasons for patient to have spiked a fever, in addition to the patient having a history of being sensitive to medications/interventions in the past, that we should continue for 15 min more and assess.  If VSS worsened, immediately stop.  [facility] with the plan as stated above and the blood transfusion was restarted.  1710 VS 103/56, 115bpm, 101.9 oral temp, 9% on 6L.  This RN immediately stopped infusion, alerted charge RN Sonne, and notified physicians & ICU team.  Recheck of BP was 87/61.  0.9NS 25070molus was started. Pt drowsy but able to arouse to tactile stimulation. Rapid RN arrived at bedside to assess patient, bolus completed, report was called to [facility], and patient was transferred to unit with rapid RN at 1740.  Wife brought all belongings and notified family.

## 2022-07-13 NOTE — Progress Notes (Signed)
Palliative Care Change in Clinical Status   Jerome Cruz has declined dramatically throughout the day. CT imaging shows rapid spread of his cancer throughout his abdomen including now involvement of the peritoneum and tumor causing small bowel obstruction. He also has melena and a falling Hb. He has life threatening issues and possible ischemic bowel. There is a high probability that he will not survive this hospitalization.  I met with both patient and wife to discuss goals of care.  Recommendations: DNR,  MEDICAL SCOPE OF TREATMENT: LIMITED INTERVENTIONS, OK FOR BLOOD, ABX OK, NO PRESSORS, NO CPR, NO ADVANCED AIRWAYS Ketamine Infusion for intractable pain and suffering. (He cannot take benzodiazapine-causes agitation) Hydromorphone IV q30 min for pain PRN Octreotide infusion for malignant obstruction and stabilization -could avoid venting PEG-will follow NG output. If issues with ketamine can use low dose  phenobarbitol for tremors and anxiety.  Goals: Comfort is the primary goal, followed by conservative medical management and stabilization so that family can get to hospital and also process prognosis and grief.  Lane Hacker, DO Palliative Medicine   Time: 55 min

## 2022-07-13 NOTE — Consult Note (Signed)
Reason for Consult:Pneumatosis Referring Physician: Abdiel Blackerby is an 66 y.o. male.  HPI: This is a 66 year old male with a history of bladder adenocarcinoma s/p cystoprostatectomy with ileal conduit who was admitted 07/20/2022 with severe pelvic/ back pain due to pathologic sacral fracture.  On the date of admission, he had no complaints of abdominal pain, nausea, vomiting, diarrhea, or constipation.  On 07/07/2022, CT scan revealed widespread lung and liver metastases, along with multiple pelvic masses with the largest 4.9 cm in diameter.  At that time there was no evidence of intestinal obstruction or pneumoperitoneum.  Over the last few days, he has developed persistent nausea, vomiting, and hiccups.  Plain films showed dilated small bowel consistent with ileus.  He has had some diarrhea with evidence of melena.    Palliative Care has been involved, mostly for management of pain from his pathologic sacral fracture.  The patient has had worsening renal function, with a creatinine of 2.1.  WBC is normal.  He has received transfusion for anemia.    A CT scan today revealed that the pelvic masses are now involving the small bowel with signs of obstruction, pneumatosis, and portal venous gas.  Lactate is pending.    Past Medical History:  Diagnosis Date   Bladder tumor 09/26/2021   ED (erectile dysfunction)    GERD (gastroesophageal reflux disease)    Hyperlipidemia    Hypertension     Past Surgical History:  Procedure Laterality Date   ANKLE SURGERY Right    COLONOSCOPY  2012   COLONOSCOPY  03/27/2018   CYSTOSCOPY WITH INJECTION N/A 03/27/2022   Procedure: CYSTOSCOPY WITH INJECTION OF INDOCYANINE GREEN DYE;  Surgeon: Alexis Frock, MD;  Location: [facility];  Service: Urology;  Laterality: N/A;   CYSTOSCOPY WITH URETHRAL DILATATION N/A 10/02/2021   Procedure: CYSTOSCOPY WITH URETHRAL BALLOON DILATATION;  Surgeon: Robley Fries, MD;  Location: [facility];  Service:  Urology;  Laterality: N/A;   IR IMAGING GUIDED PORT INSERTION  10/24/2021   LYMPH NODE DISSECTION Bilateral 03/27/2022   Procedure: PELVIC LYMPH NODE DISSECTION;  Surgeon: Alexis Frock, MD;  Location: [facility];  Service: Urology;  Laterality: Bilateral;   ROBOT ASSISTED LAPAROSCOPIC COMPLETE CYSTECT ILEAL CONDUIT N/A 03/27/2022   Procedure: XI ROBOTIC ASSISTED LAPAROSCOPIC COMPLETE CYSTECT ILEAL CONDUIT;  Surgeon: Alexis Frock, MD;  Location: [facility];  Service: Urology;  Laterality: N/A;  6 HRS   ROBOT ASSISTED LAPAROSCOPIC RADICAL PROSTATECTOMY N/A 03/27/2022   Procedure: XI ROBOTIC ASSISTED LAPAROSCOPIC RADICAL PROSTATECTOMY;  Surgeon: Alexis Frock, MD;  Location: [facility];  Service: Urology;  Laterality: N/A;   TRANSURETHRAL RESECTION OF BLADDER TUMOR WITH MITOMYCIN-C N/A 10/02/2021   Procedure: TRANSURETHRAL RESECTION OF BLADDER TUMOR WITH GEMCITABINE;  Surgeon: Robley Fries, MD;  Location: [facility];  Service: Urology;  Laterality: N/A;  57 MINS    Family History  Problem Relation Age of Onset   Diabetes Mother    Hypertension Mother    Cancer Father        type unknown   Diabetes Mellitus I Son        since age 16   Colon cancer Neg Hx    Rectal cancer Neg Hx    Stomach cancer Neg Hx    Esophageal cancer Neg Hx     Social History:  reports that he quit smoking about 20 years ago. His smoking use included cigarettes. He has a 15.00 pack-year smoking history. He has never used  smokeless tobacco. He reports current alcohol use. He reports that he does not use drugs.  Allergies:  Allergies  Allergen Reactions   Ativan [Lorazepam]     Paradoxical reaction noted after receiving IV Ativan prior to radiation on 07/10/22   Atorvastatin Other (See Comments)    Pt unsure of reaction    Irbesartan Other (See Comments)    Pt unsure of reaction     Medications:  Prior to Admission medications   Medication Sig Start Date End Date Taking? Authorizing Provider   acetaminophen (TYLENOL) 500 MG tablet Take 500 mg by mouth every 6 (six) hours as needed for moderate pain.   Yes [provider]  esomeprazole (NEXIUM) 20 MG capsule Take 20 mg by mouth daily.   Yes [provider]  gabapentin (NEURONTIN) 300 MG capsule Take 300 mg by mouth 2 (two) times daily. 07/04/22  Yes [provider]  ibuprofen (ADVIL) 200 MG tablet Take 200 mg by mouth every 6 (six) hours as needed for moderate pain.   Yes [provider]  oxyCODONE (OXY IR/ROXICODONE) 5 MG immediate release tablet Take 1 tablet (5 mg total) by mouth every 4 (four) hours as needed for severe pain. Take 1 to 2 tablets every 4 hours for pain. 07/04/22  Yes Wyatt Portela, MD  simvastatin (ZOCOR) 20 MG tablet Take 20 mg by mouth daily.   Yes [provider]  valsartan (DIOVAN) 80 MG tablet Take 80 mg by mouth in the morning.   Yes [provider]  lidocaine-prilocaine (EMLA) cream Apply 1 application topically as needed. Patient not taking: Reported on 03/21/2022 10/17/21   Wyatt Portela, MD  methocarbamol (ROBAXIN) 750 MG tablet Take 1 tablet (750 mg total) by mouth every 6 (six) hours as needed for muscle spasms. Patient not taking: Reported on 06/24/2022 06/21/22   Lanae Crumbly, PA-C  prochlorperazine (COMPAZINE) 10 MG tablet Take 1 tablet (10 mg total) by mouth every 6 (six) hours as needed for nausea or vomiting. Patient not taking: Reported on 03/21/2022 10/17/21   Wyatt Portela, MD  senna-docusate (SENOKOT-S) 8.6-50 MG tablet Take 1 tablet by mouth 2 (two) times daily. While taking strong pain meds to prevent constipation Patient not taking: Reported on 07/12/2022 04/02/22   Alexis Frock, MD     Results for orders placed or performed during the hospital encounter of 07/07/2022 (from the past 48 hour(s))  Basic metabolic panel     Status: Abnormal   Collection Time: 07/12/22  3:27 AM  Result Value Ref Range   Sodium 137 135 - 145 mmol/L    Potassium 4.1 3.5 - 5.1 mmol/L   Chloride 95 (L) 98 - 111 mmol/L   CO2 29 22 - 32 mmol/L   Glucose, Bld 123 (H) 70 - 99 mg/dL    Comment: Glucose reference range applies only to samples taken after fasting for at least 8 hours.   BUN 64 (H) 8 - 23 mg/dL   Creatinine, Ser 1.33 (H) 0.61 - 1.24 mg/dL   Calcium 9.7 8.9 - 10.3 mg/dL   GFR, Estimated >60 >60 mL/min    Comment: (NOTE) Calculated using the CKD-EPI Creatinine Equation (2021)    Anion gap 13 5 - 15    Comment: Performed at [facility], [place] [address], [place], [place] [address]  Basic metabolic panel     Status: Abnormal   Collection Time: 06/25/2022  3:36 AM  Result Value Ref Range   Sodium 131 (  L) 135 - 145 mmol/L   Potassium 4.2 3.5 - 5.1 mmol/L   Chloride 88 (L) 98 - 111 mmol/L   CO2 31 22 - 32 mmol/L   Glucose, Bld 133 (H) 70 - 99 mg/dL    Comment: Glucose reference range applies only to samples taken after fasting for at least 8 hours.   BUN 93 (H) 8 - 23 mg/dL   Creatinine, Ser 2.15 (H) 0.61 - 1.24 mg/dL   Calcium 9.4 8.9 - 10.3 mg/dL   GFR, Estimated 34 (L) >60 mL/min    Comment: (NOTE) Calculated using the CKD-EPI Creatinine Equation (2021)    Anion gap 12 5 - 15    Comment: Performed at [facility], [place] [address], [place], [place] [address]  CBC     Status: Abnormal   Collection Time: 07/07/2022  3:36 AM  Result Value Ref Range   WBC 6.7 4.0 - 10.5 K/uL   RBC 2.16 (L) 4.22 - 5.81 MIL/uL   Hemoglobin 7.1 (L) 13.0 - 17.0 g/dL   HCT 20.5 (L) 39.0 - 52.0 %   MCV 94.9 80.0 - 100.0 fL   MCH 32.9 26.0 - 34.0 pg   MCHC 34.6 30.0 - 36.0 g/dL   RDW 15.4 11.5 - 15.5 %   Platelets 223 150 - 400 K/uL   nRBC 0.0 0.0 - 0.2 %    Comment: Performed at [facility], [place] [address], [place], [place] [address]  Type and screen [place]     Status: None (Preliminary result)   Collection Time: 07/12/2022 11:21 AM  Result Value Ref Range   ABO/RH(D)  A POS    Antibody Screen NEG    Sample Expiration      07/16/2022,2359 Performed at [facility], [place] [address], [place], [place] [address]    Unit Number [id number]    Blood Component Type RED CELLS,LR    Unit division 00    Status of Unit ALLOCATED    Transfusion Status OK TO TRANSFUSE    Crossmatch Result Compatible   Occult blood card to lab, stool     Status: Abnormal   Collection Time: 06/29/2022 11:49 AM  Result Value Ref Range   Fecal Occult Bld POSITIVE (A) NEGATIVE    Comment: Performed at [facility], [place] [address] [place], [place] [address]  Prepare RBC (crossmatch)     Status: None   Collection Time: 06/24/2022 12:09 PM  Result Value Ref Range   Order Confirmation      ORDER PROCESSED BY BLOOD BANK Performed at [facility], [place] [address] [place],  [address]      Review of Systems  HENT:  Negative for ear discharge, ear pain, hearing loss and tinnitus.   Eyes:  Negative for photophobia and pain.  Respiratory:  Negative for cough and shortness of breath.   Cardiovascular:  Negative for chest pain.  Gastrointestinal:  Positive for abdominal distention, abdominal pain, diarrhea, nausea and vomiting.  Genitourinary:  Negative for dysuria, flank pain, frequency and urgency.  Musculoskeletal:  Negative for back pain, myalgias and neck pain.  Neurological:  Negative for dizziness and headaches.  Hematological:  Does not bruise/bleed easily.  Psychiatric/Behavioral:  The patient is not nervous/anxious.    Blood pressure 118/64, pulse 84, temperature 98.4 F (36.9 C), resp. rate 15, height '5\' 9"'$  (1.753 m), weight 63.5 kg, SpO2 90 %. Physical Exam Very ill-appearing male - NG tube just  placed Constitutional:  Pale, interactive, but obviously uncomfortable Eyes:  Pupils equal, round; sclera anicteric; moist conjunctiva; no lid lag HENT:  Oral mucosa moist; good dentition  Neck:  No masses palpated,  trachea midline; no thyromegaly Lungs:  CTA bilaterally; normal respiratory effort CV:  Regular rate and rhythm; no murmurs; extremities well-perfused with no edema Abd:  Distended, mild diffuse tenderness; viable ileal conduit Musc:  Unable to assess gait; no apparent clubbing or cyanosis in extremities Lymphatic:  No palpable cervical or axillary lymphadenopathy Skin:  Warm, dry; no sign of jaundice Psychiatric - alert and oriented x 4; calm mood and affect  Assessment/Plan: Rapid enlargement of pelvic metastases - now causing small bowel obstruction with pneumatosis. Unfortunately, there is no good surgical treatment for this problem.  The bowel in the pelvis is unresectable.  A palliative bypass might work temporarily but would also likely develop obstruction.  A palliative gastrostomy tube might be helpful to keep his stomach decompressed.  That could be placed by IR.  NG tube today to decompress his stomach Further discussions with palliative care regarding goals of care. I spoke with the patient and his wife to discuss these findings.  Per [facility] ABL Anemia AKI Hyponatremia Hypertension  Maia Petties 07/20/2022, 3:16 PM

## 2022-07-13 NOTE — Progress Notes (Signed)
Palliative Care Progress Note  Jerome Cruz continues to struggle with side effects and symptoms of his cancer and treatment interventions. He has developed worsening vomiting, nausea and intractable hiccups over the past 24-48 hours. He has had difficulty with side effects of opioids and benzodiazapine's -hallucinations and agitation from both.   Recommendations:  D/C morphine and Toradol given worsening kidney function- Start Duragesic Patch 40mg, continue hydromorphone for breakthrough pain. Re-CT ABD/PELVIS- KUB showing possible ileus, but he is having bowel movements. Started on Reglan and Zofran. Will give a one time dose of Thorazine for hiccups-they have been persistent and difficult to manage. He is already on gabapentin and PPI, H2 blocker. Will reduce Steroid dose to '2mg'$  daily. Will continue to follow and support for symptom management.  Discussed care plan and recommendations with patient, his wife, RN and hospitalist.  ELane Hacker DO Palliative Medicine   Time: 35 min

## 2022-07-14 DIAGNOSIS — K559 Vascular disorder of intestine, unspecified: Secondary | ICD-10-CM

## 2022-07-14 DIAGNOSIS — K56609 Unspecified intestinal obstruction, unspecified as to partial versus complete obstruction: Secondary | ICD-10-CM

## 2022-07-14 DIAGNOSIS — A419 Sepsis, unspecified organism: Secondary | ICD-10-CM

## 2022-07-14 DIAGNOSIS — E872 Acidosis, unspecified: Secondary | ICD-10-CM

## 2022-07-14 DIAGNOSIS — D62 Acute posthemorrhagic anemia: Secondary | ICD-10-CM

## 2022-07-14 LAB — TYPE AND SCREEN
ABO/RH(D): A POS
Antibody Screen: NEGATIVE
Unit division: 0

## 2022-07-14 LAB — BPAM RBC
Blood Product Expiration Date: 202307312359
ISSUE DATE / TIME: 202307221625
Unit Type and Rh: 6200

## 2022-07-15 ENCOUNTER — Ambulatory Visit: Payer: BC Managed Care – PPO

## 2022-07-16 ENCOUNTER — Ambulatory Visit: Payer: BC Managed Care – PPO

## 2022-07-17 ENCOUNTER — Ambulatory Visit: Payer: BC Managed Care – PPO

## 2022-07-17 LAB — TRANSFUSION REACTION
DAT C3: NEGATIVE
Post RXN DAT IgG: NEGATIVE

## 2022-07-18 ENCOUNTER — Encounter: Payer: Self-pay | Admitting: Internal Medicine

## 2022-07-18 ENCOUNTER — Ambulatory Visit: Payer: BC Managed Care – PPO

## 2022-07-19 ENCOUNTER — Ambulatory Visit: Payer: BC Managed Care – PPO

## 2022-07-22 ENCOUNTER — Ambulatory Visit: Payer: BC Managed Care – PPO

## 2022-07-23 ENCOUNTER — Ambulatory Visit: Payer: BC Managed Care – PPO

## 2022-07-23 NOTE — Death Summary Note (Signed)
DEATH SUMMARY   Patient Details  Name: Jerome Cruz MRN: [id number] DOB: 01/07/1956 JJK:[id number], [name], MD Admission/Discharge Information   Admit Date:  August 06, 2022  Date of Death: Date of Death: 08-11-22  Time of Death: Time of Death: 04-03-2326  Length of Stay: 5   Principle Cause of death: bowel ischemia from metastatic cancer causing small bowel obstruction  Hospital Diagnoses: Principal Problem:   Intractable low back pain Active Problems:   Essential hypertension   GERD   Malignant neoplasm of urinary bladder (HCC)   Normocytic anemia   Hyperlipidemia   Leukocytosis   Intractable pain   Protein-calorie malnutrition, severe   Lactic acidosis   SBO (small bowel obstruction) ([place])   ABLA (acute blood loss anemia)   Small bowel ischemia (HCC)   Septic shock [facility])   Hospital Course: Patient was admitted to the hospital with intractable low back pain due to pathological sacral fractures.  He has a known history of metastatic bladder cancer with rapid progression recently and fast relapse after finishing chemotherapy.  During this hospital stay patient developed nausea, vomiting, and imaging of his abdomen with a CT scan revealed significant progression of his metastatic disease now causing small bowel obstruction due to large metastatic mass, with signs of small bowel pneumatosis as well as portal venous gas, strongly suggesting bowel ischemia.  General surgery was consulted and unfortunately there were no good surgical options, with the bowel in the pelvis felt to be unresectable.  Palliative care also followed patient, and given lack of surgical options and severe findings on the CT scan, and after discussing with the family, he was made DNR/DNI with a focus on comfort.  Patient eventually passed away on August 11, 2022, at 11:27 PM.  Final diagnoses Small bowel obstruction Small bowel ischemia, probable perforation Septic shock due to probable peritonitis Stage IV bladder cancer  metastatic to bone, lung, liver, abdomen Acute blood loss anemia Hyponatremia Melena, lower GI bleed Acute kidney injury Leukocytosis Leukopenia Essential hypertension Hyperlipidemia Severe protein calorie malnutrition Lactic acidosis     Nutrition Documentation    Flowsheet Row ED to Hosp-Admission (Discharged) from August 06, 2022 in [facility]-ICU/STEPDOWN  Nutrition Problem Severe Malnutrition  Etiology chronic illness, cancer and cancer related treatments  [bladder adenocarcinoma]  Nutrition Goal Patient will meet greater than or equal to 90% of their needs  Interventions Magic cup, Liberalize Diet, Carnation Instant Breakfast      The results of significant diagnostics from this hospitalization (including imaging, microbiology, ancillary and laboratory) are listed below for reference.   Significant Diagnostic Studies: DG Abd 1 View  Result Date: 2022/08/11 CLINICAL DATA:  Nasogastric tube placement. EXAM: ABDOMEN - 1 VIEW COMPARISON:  07/12/2022, CT August 11, 2022 FINDINGS: Nasogastric tube is present with tip over the stomach in the left upper quadrant and side-port just above the expected region of the gastroesophageal junction. Evidence of patient's known portal venous air over the liver. Known dilated air-filled small bowel loops in the mid abdomen. Remainder of the exam is unchanged. IMPRESSION: 1. Known portal venous air over the liver. Known dilated air-filled small bowel loops in the mid abdomen unchanged. 2. Nasogastric tube with tip over the stomach in the left upper quadrant. This could be advanced approximately 7-8 cm. Electronically Signed   By: Marin Olp M.D.   On: 2022-08-11 16:12   CT ABDOMEN PELVIS WO CONTRAST  Result Date: 11-Aug-2022 CLINICAL DATA:  Evaluate for bowel obstruction. History of metastatic bladder cancer. EXAM: CT ABDOMEN AND PELVIS WITHOUT CONTRAST  TECHNIQUE: Multidetector CT imaging of the abdomen and pelvis was performed following  the standard protocol without IV contrast. RADIATION DOSE REDUCTION: This exam was performed according to the departmental dose-optimization program which includes automated exposure control, adjustment of the mA and/or kV according to patient size and/or use of iterative reconstruction technique. body onc COMPARISON:  06/30/2022 FINDINGS: Lower chest: Bilateral pulmonary nodules are again noted compatible with metastatic disease. Nodule within the posteromedial right lower lobe measures 2.3 cm, image 12/6. Unchanged. Right lower lobe lung nodule measures 1.4 cm, image 18/6. Also unchanged. New bilateral lower lobe airspace opacities identified. Hepatobiliary: Multifocal liver metastasis identified on CT from 07/21/2022 are suboptimally visualized on today's study reflecting lack of IV contrast material. The gallbladder appears partially decompressed. No bile duct dilatation. Pancreas: Unremarkable. No pancreatic ductal dilatation or surrounding inflammatory changes. Spleen: Normal in size without focal abnormality. Adrenals/Urinary Tract: Normal adrenal glands. Asymmetric left renal cortical scarring and volume loss noted. Right kidney appears normal. There are postsurgical changes from cystectomy with urinary diverting loop ileostomy in the right hemiabdomen. Similar appearance of bilateral pelviectasis. Stomach/Bowel: The stomach appears diffusely distended. The duodenum and jejunal bowel loops are dilated with multiple air-fluid levels which measure up to 4.2 cm in diameter. Transition to D priest caliber distal small bowel is identified within the right hemipelvis where there is a large mass involving the right iliac fossa and right pelvic sidewall. Pneumatosis is identified along with extensive gas within the portal venous mesentery. Gas is noted within the superior mesenteric vein, portal vein and liver. There is no bowel wall thickening or surrounding inflammation. Vascular/Lymphatic: Aortic atherosclerosis.  Mass within the right iliac fossa and right pelvic sidewall measures 5.4 x 5.4 cm, image 78/2. Formally this was measured at 4.9 x 4.5 cm. Nodal mass within the left pelvic sidewall measures 3.9 x 4.0 cm, unchanged from previous exam. Left posterior pelvic lymph node measures 1.3 cm, image 84/2. Unchanged from previous exam. No signs of adenopathy within the abdomen. Reproductive: Status post prostatectomy. Other: No free fluid or fluid collections. Musculoskeletal: No acute or significant osseous findings. IMPRESSION: 1. Examination is positive for small bowel obstruction. Transition to caliber distal small bowel is identified within the right hemipelvis where there is a large mass involving the right iliac fossa and right pelvic sidewall. Signs of small bowel pneumatosis with portal venous gas is identified. 2. Bilateral pulmonary nodules compatible with metastatic disease. Similar to previous exam. 3. Multifocal liver metastasis identified on CT from 07/10/2022 are suboptimally visualized on today's study reflecting lack of IV contrast material. 4. New bilateral lower lobe airspace opacities. Findings may reflect pneumonia or aspiration. 5. Aortic Atherosclerosis (ICD10-I70.0). 6. Critical Value/emergent results were called by telephone at the time of interpretation on 07/17/2022 at 2:35 pm to provider [facility] , who verbally acknowledged these results. Electronically Signed   By: Kerby Moors M.D.   On: 07/19/2022 14:35   DG Abd Portable 1V  Result Date: 07/12/2022 CLINICAL DATA:  Nausea EXAM: PORTABLE ABDOMEN - 1 VIEW COMPARISON:  CT abdomen pelvis 07/02/2022 FINDINGS: Interval development of diffuse small bowel dilatation which may be due to obstruction or ileus. No suspicious calcifications. IMPRESSION: Interval development of small bowel dilatation which may be due to ileus or obstruction. Electronically Signed   By: Miachel Roux M.D.   On: 07/12/2022 09:48   VAS [place] LOWER EXTREMITY VENOUS  (DVT)  Result Date: 07/11/2022  Lower Venous DVT Study Patient Name:  RUSTON FEDORA  Date  of Exam:   07/11/2022 Medical Rec #: [id number]        Accession #:    [id number] Date of Birth: 1956-09-08        Patient Gender: M Patient Age:   60 years Exam Location:  [facility] Procedure:      VAS [place] LOWER EXTREMITY VENOUS (DVT) Referring Phys: Marzetta Board --------------------------------------------------------------------------------  Indications: Swelling.  Risk Factors: Cancer. Comparison Study: No prior studies. Performing Technologist: Oliver Hum RVT  Examination Guidelines: A complete evaluation includes B-mode imaging, spectral Doppler, color Doppler, and power Doppler as needed of all accessible portions of each vessel. Bilateral testing is considered an integral part of a complete examination. Limited examinations for reoccurring indications may be performed as noted. The reflux portion of the exam is performed with the patient in reverse Trendelenburg.  +---------+---------------+---------+-----------+----------+--------------+ RIGHT    CompressibilityPhasicitySpontaneityPropertiesThrombus Aging +---------+---------------+---------+-----------+----------+--------------+ CFV      Full           Yes      Yes                                 +---------+---------------+---------+-----------+----------+--------------+ SFJ      Full                                                        +---------+---------------+---------+-----------+----------+--------------+ FV Prox  Full                                                        +---------+---------------+---------+-----------+----------+--------------+ FV Mid   Full                                                        +---------+---------------+---------+-----------+----------+--------------+ FV DistalFull                                                         +---------+---------------+---------+-----------+----------+--------------+ PFV      Full                                                        +---------+---------------+---------+-----------+----------+--------------+ POP      Full           Yes      Yes                                 +---------+---------------+---------+-----------+----------+--------------+ PTV      Full                                                        +---------+---------------+---------+-----------+----------+--------------+  PERO     Full                                                        +---------+---------------+---------+-----------+----------+--------------+   +---------+---------------+---------+-----------+----------+--------------+ LEFT     CompressibilityPhasicitySpontaneityPropertiesThrombus Aging +---------+---------------+---------+-----------+----------+--------------+ CFV      Full           Yes      Yes                                 +---------+---------------+---------+-----------+----------+--------------+ SFJ      Full                                                        +---------+---------------+---------+-----------+----------+--------------+ FV Prox  Full                                                        +---------+---------------+---------+-----------+----------+--------------+ FV Mid   Full                                                        +---------+---------------+---------+-----------+----------+--------------+ FV DistalFull                                                        +---------+---------------+---------+-----------+----------+--------------+ PFV      Full                                                        +---------+---------------+---------+-----------+----------+--------------+ POP      Full           Yes      Yes                                  +---------+---------------+---------+-----------+----------+--------------+ PTV      Full                                                        +---------+---------------+---------+-----------+----------+--------------+ PERO     Full                                                        +---------+---------------+---------+-----------+----------+--------------+  Summary: RIGHT: - There is no evidence of deep vein thrombosis in the lower extremity.  - No cystic structure found in the popliteal fossa.  LEFT: - There is no evidence of deep vein thrombosis in the lower extremity.  - No cystic structure found in the popliteal fossa.  *See table(s) above for measurements and observations. Electronically signed by Jamelle Haring on 07/11/2022 at 8:17:49 PM.    Final    CT ABDOMEN PELVIS W CONTRAST  Result Date: 06/25/2022 CLINICAL DATA:  Bladder carcinoma, evaluate for metastatic disease EXAM: CT ABDOMEN AND PELVIS WITH CONTRAST TECHNIQUE: Multidetector CT imaging of the abdomen and pelvis was performed using the standard protocol following bolus administration of intravenous contrast. RADIATION DOSE REDUCTION: This exam was performed according to the departmental dose-optimization program which includes automated exposure control, adjustment of the mA and/or kV according to patient size and/or use of iterative reconstruction technique. CONTRAST:  181m OMNIPAQUE IOHEXOL 300 MG/ML  SOLN COMPARISON:  Previous studies including study done on 01/24/2022 FINDINGS: Lower chest: There is interval appearance of multiple nodules of varying sizes in the lower lung fields largest measuring 2.3 cm in size in left lower lobe. There is no pleural effusion. Hepatobiliary: There is interval appearance of multiple low-density lesions of varying sizes in the liver. Largest of the lesions measures approximally 1.9 cm. There is no dilation of bile ducts. Gallbladder is distended. There is no wall thickening in  gallbladder. Pancreas: No focal abnormalities are seen. Spleen: Unremarkable. Adrenals/Urinary Tract: Adrenals are not enlarged. There is no hydronephrosis. Left kidney smaller than right. There are foci of cortical thinning in the kidneys, more so on the left side suggesting scarring from chronic pyelonephritis. There is interval diversion of ureters 2 ileal conduit in the right mid abdomen. Left renal pelvis is prominent measuring 2.2 cm in AP diameter. There is a small 1 mm calculus in the lower pole of right kidney. There are no demonstrable ureteral stones. Urinary bladder is not seen. There are multiple soft tissue nodules of varying sizes in pelvic cavity largest in the right lateral pelvis measuring 4.9 x 4.5 cm. There is 3.6 cm mass in left side of pelvis posterior to the pubic bone. There is 4.2 x 2.2 cm mass in left posterior pelvis. There are other smaller soft tissue nodules in pelvic cavity. Stomach/Bowel: There is fluid density in the lumen of the lower thoracic esophagus, possibly suggesting gastroesophageal reflux. Stomach is unremarkable. Small bowel loops are not dilated. Appendix is not distinctly seen. There is no pericecal inflammation. There is no significant wall thickening in colon. Vascular/Lymphatic: Vascular structures are unremarkable. Possibility lymphadenopathy in the pelvis is not excluded. Reproductive: Prostate is not seen. Other: There is no ascites or pneumoperitoneum. Musculoskeletal: No displaced fractures are seen. No definite focal lytic or sclerotic lesions are seen. IMPRESSION: There is interval appearance of multiple nodules of varying sizes in both lower lung fields suggesting pulmonary metastatic disease. There is interval appearance of multiple low-density lesions of varying sizes in the liver consistent with hepatic metastatic disease. There is interval appearance of multiple soft tissue masses of varying sizes in pelvic cavity largest measuring 4.9 cm in diameter. This  may be due to local recurrence of bladder neoplasm and possibly pelvic lymphadenopathy due to metastatic disease. There is no evidence of intestinal obstruction or pneumoperitoneum. There is no hydronephrosis. Other findings as described in the body of the report. Electronically Signed   By: PElmer PickerM.D.   On: 06/22/2022 14:05  MR PELVIS W WO CONTRAST  Addendum Date: 07/01/2022   ADDENDUM REPORT: 07/01/2022 12:03 ADDENDUM: Findings were discussed with [name], MD on 07/01/2022 at 11:57 am. Electronically Signed   By: Maurine Simmering M.D.   On: 07/01/2022 12:03   Result Date: 07/01/2022 CLINICAL DATA:  Right lower back pain radiating to bilateral lower leg and groin area, history of bladder cancer EXAM: MRI PELVIS WITHOUT AND WITH CONTRAST TECHNIQUE: Multiplanar multisequence MR imaging of the pelvis was performed both before and after administration of intravenous contrast. CONTRAST:  38m MULTIHANCE GADOBENATE DIMEGLUMINE 529 MG/ML IV SOLN COMPARISON:  MRI 06/14/2022, CT 01/24/2022. FINDINGS: Urinary Tract:  Prior cystectomy. Bowel: Partially visualized exterior right abdominal wall pouch presumably related to ileo conduit/ostomy. No dilated bowel loops to suggest a bowel obstruction. Vascular/Lymphatic: No lymphadenopathy. No significant vascular findings present. Reproductive:  The prostate is surgically absent. Other:  None Musculoskeletal: Intense marrow edema with surrounding linear low T1 and T2 signal within the left sacrum at S3, primarily in zone 1/2, with extension to the SI joint and the left S3 neural foramen. Marrow edema extends into the S2 and S4 segments. There is a rim of enhancement the central non-enhancement in this region. Degree of marrow edema has increased since the prior exam. There is reactive intramuscular edema in the adjacent piriformis muscle. There is reactive edema within the bilateral hip adductors. Previously seen edema signal within the pubic bodies with likely  artifactual. There is intense marrow edema with enhancement at the right puboacetabular junction (series 7, image 34), markedly increased from prior exam when there is a small focus of edema, much less conspicuous. The bilateral gluteal tendons are intact. Proximal hamstrings are intact. IMPRESSION: Nondisplaced fracture of the left sacrum at S3, zone 1/2 with extension to the left SI joint and likely into the left S3 neural foramen. Intense associated marrow edema with confluent low T1 signal and surrounding rim of enhancement. MRI characteristics are concerning for a possible metastasis with associated pathologic fracture. Additional intense marrow edema and enhancement with confluent low T1 signal at the right puboacetabular junction, increased from the prior exam, with potential small fracture line. This is also concerning for a possible metastasis with associated pathologic fracture. Recommend continued oncologic surveillance, with consideration of direct tissue sampling of the left sacrum. CT of the pelvis would also be useful for further evaluation bony detail and potential procedural planning if indicated. Electronically Signed: By: JMaurine SimmeringM.D. On: 07/01/2022 10:57   DG NERVE ROOT BLOCK LUMBAR SACRAL  Result Date: 06/26/2022 CLINICAL DATA:  The patient did not get any significant relief from the epidural injection dated 06/05/2022. He presents today with severe bilateral leg pain and groin pain, much worse on the right than the left. EXAM: Bilateral L2 nerve root block and transforaminal epidural COMPARISON:  Prior injection images [id number] MRI lumbar and limited pelvis 06/14/2022. TECHNIQUE: The overlying skin was prepped with Betadine and draped in sterile fashion. Skin anesthesia was carried out using 1% Lidocaine. A curved 22 gauge spinal needle was directed into the superior ventral neural foramen on the left at L2-3 and subsequently on the right at L2-3. Injection of a few drops of Isovue 200  demonstrates spread outlining the left nerve root. No intravascular extension. 40 mg Depo-Medrol mixed with a few drops of 1% lidocaine for placed on the left, and 80 mg Depo-Medrol and 1 cc 1% lidocaine were placed on the right. No apparent complication. The patient tolerated the procedure without difficulty and  was transferred to recovery in excellent condition. 15 minutes following the injection, he was feeling considerable relief of his pain. FLUOROSCOPY: 1 minute 14 seconds.  47.84 micro gray meter squared IMPRESSION: Technically successful bilateral L2 selective nerve root block and transforaminal epidural steroid injection. 40 mg placed on the left. 80 mg placed on the right. At the time of discharge, the patient was feeling considerable relief. The expected time courses of the anesthetic and steroid were discussed. On reviewing the imaging, I am somewhat concerned about the appearance of the left sacral abnormality. This does not appear entirely typical of a fracture to my evaluation. The patient has a prior history of bladder cancer. I discussed this with Dr. Joylene Draft and there is consideration of a repeat sacral MRI with and without contrast. Electronically Signed   By: Nelson Chimes M.D.   On: 06/26/2022 11:18   DG NERVE ROOT BLOCK LUMBAR SACRAL  Result Date: 06/26/2022 CLINICAL DATA:  The patient did not get any significant relief from the epidural injection dated 06/05/2022. He presents today with severe bilateral leg pain and groin pain, much worse on the right than the left. EXAM: Bilateral L2 nerve root block and transforaminal epidural COMPARISON:  Prior injection images [id number]. MRI lumbar and limited pelvis 06/14/2022. TECHNIQUE: The overlying skin was prepped with Betadine and draped in sterile fashion. Skin anesthesia was carried out using 1% Lidocaine. A curved 22 gauge spinal needle was directed into the superior ventral neural foramen on the left at L2-3 and subsequently on the right at L2-3.  Injection of a few drops of Isovue 200 demonstrates spread outlining the left nerve root. No intravascular extension. 40 mg Depo-Medrol mixed with a few drops of 1% lidocaine for placed on the left, and 80 mg Depo-Medrol and 1 cc 1% lidocaine were placed on the right. No apparent complication. The patient tolerated the procedure without difficulty and was transferred to recovery in excellent condition. 15 minutes following the injection, he was feeling considerable relief of his pain. FLUOROSCOPY: 1 minute 14 seconds.  47.84 micro gray meter squared IMPRESSION: Technically successful bilateral L2 selective nerve root block and transforaminal epidural steroid injection. 40 mg placed on the left. 80 mg placed on the right. At the time of discharge, the patient was feeling considerable relief. The expected time courses of the anesthetic and steroid were discussed. On reviewing the imaging, I am somewhat concerned about the appearance of the left sacral abnormality. This does not appear entirely typical of a fracture to my evaluation. The patient has a prior history of bladder cancer. I discussed this with Dr. Joylene Draft and there is consideration of a repeat sacral MRI with and without contrast. Electronically Signed   By: Nelson Chimes M.D.   On: 06/26/2022 11:18   MR PELVIS WO CONTRAST  Result Date: 06/15/2022 CLINICAL DATA:  Lower back pain since bladder surgery 04/11/2022. Diagnosis of bladder cancer 08/2021. Increased pain after fell 05/31/2022. Prior MRI of the lumbar spine 05/28/2022. rectal pain. EXAM: MRI PELVIS WITHOUT CONTRAST TECHNIQUE: Multiplanar multisequence MR imaging of the pelvis was performed. No intravenous contrast was administered. The patient was unable to complete this exam due to extreme pain. Axial T2 fat saturation and axial T1 images and coronal T1 images were performed. COMPARISON:  MRI lumbar spine 05/28/2022; CT chest abdomen and pelvis 01/24/2022 FINDINGS: Axial images cover the  bilateral sacroiliac joints but not the complete pelvis. Coronal images also cover the sacrum only. Patient motion artifact moderately limits evaluation. Urinary Tract:  The urinary bladder is not visualized presumably due to prior bladder surgery. Bowel: No dilated loops of bowel to indicate bowel obstruction. Partial visualization of presumably an exterior right abdominal wall pouch possibly related to post cystectomy ileal conduit/ostomy. Vascular/Lymphatic: Visualized common iliac and internal external iliac arteries are normal in caliber. Reproductive:  The prostate appears to be surgically absent. Other:  None Musculoskeletal: There is high-grade marrow edema surrounding horizontal linear decreased T1 and decreased T2 signal apparent acute fracture line within the inferior left sacrum in zone 1, extending laterally to the left sacroiliac joint (axial image 18 and coronal image 12). There also is increased T2 signal that may represent true edema versus artifact within the inferior aspect of the bilateral pubic bodies (axial series 3 images 23 through 26). This is suspicious for at least marrow edema from degenerative change or bone marrow contusion. Cannot exclude tiny nondisplaced fractures in this region. IMPRESSION: 1. Unfortunately, the patient could not complete a this exam due to pain. There is also motion artifact. 2. High-grade marrow edema surrounding horizontal linear nondisplaced acute fracture within the inferior left sacrum extending to the left sacroiliac joint. 3. Possible marrow edema within the inferior aspect of the bilateral pubic bodies. If this is real in the artifactual, it may represent bone marrow edema from a contusion versus additional nondisplaced fractures. Electronically Signed   By: Yvonne Kendall M.D.   On: 06/15/2022 07:10   MR LUMBAR SPINE WO CONTRAST  Result Date: 06/14/2022 CLINICAL DATA:  Initial evaluation for low back pain since bladder surgery, prior fall. EXAM: MRI  LUMBAR SPINE WITHOUT CONTRAST TECHNIQUE: Multiplanar, multisequence MR imaging of the lumbar spine was performed. No intravenous contrast was administered. COMPARISON:  Prior MRI from 05/28/2022. FINDINGS: Segmentation: Transitional lumbosacral anatomy with partial sacralization of the L5 vertebral body. Same numbering system is employed as on previous exam. Alignment: Straightening of the normal lumbar lordosis. Trace stepwise retrolisthesis of L1 on L2 through L3 on L4. appearance is stable. Vertebrae: Vertebral body height maintained without acute or chronic fracture. Bone marrow signal intensity within normal limits. Node worrisome osseous lesions. No abnormal marrow edema. Conus medullaris and cauda equina: Conus extends to the T12 level. Conus and cauda equina appear normal. Paraspinal and other soft tissues: Paraspinous soft tissues demonstrate no acute finding. Left kidney is somewhat atrophic with multifocal cortical scarring. Few small simple cyst noted within the visualized right kidney, benign in appearance, no follow-up imaging recommended. Disc levels: L1-2: Mild disc bulge with disc desiccation. Superimposed small right paracentral disc protrusion minimally indents the right ventral thecal sac. No significant canal or lateral recess stenosis. Foramina remain patent. Appearance is stable. L2-3: Shallow left extraforaminal disc protrusion closely approximates the exiting left L2 nerve root (series 12, image 17). No spinal stenosis. Foramina remain patent. Appearance is stable. L3-4: Disc desiccation with mild disc bulge. Superimposed left foraminal to extraforaminal disc protrusion contacts the exiting left L3 nerve root (series 12, image 24). Mild bilateral facet hypertrophy. No significant spinal stenosis. Mild left L3 foraminal narrowing. Right neural foramen remains patent. L4-5: Degenerative intervertebral disc space narrowing with diffuse disc bulge and disc desiccation. Associated reactive  endplate spurring. Superimposed small central disc protrusion with inferior migration. Mild to moderate facet hypertrophy. Resultant mild canal with moderate bilateral subarticular stenosis. Mild to moderate bilateral L4 foraminal narrowing. Appearance is similar. L5-S1:  Negative interspace.  No canal or foraminal stenosis. IMPRESSION: 1. Similar appearance of the lumbar spine as compared to 05/28/2022. No acute abnormality.  2. Stable multifactorial degenerative changes at L4-5 with resultant mild canal and moderate bilateral subarticular stenosis, with mild to moderate bilateral L4 foraminal narrowing. 3. Left foraminal to extraforaminal disc protrusion at L3-4, contacting and potentially affecting the exiting left L3 nerve root. 4. Shallow left extraforaminal disc protrusion at L2-3, closely approximating and potentially irritating the exiting left L2 nerve root. Electronically Signed   By: Jeannine Boga M.D.   On: 06/14/2022 22:48    Microbiology: No results found for this or any previous visit (from the past 240 hour(s)).  Time spent: 15 minutes  Signed: Marzetta Board, MD

## 2022-07-23 NOTE — Progress Notes (Signed)
Notified by RN that Mr Dewoody expired 7/21 at 2327.

## 2022-07-23 NOTE — Progress Notes (Signed)
Wasted 22m of Ketamine '500mg'$  in 1074min stericycle. Witnessed by GeLorretta HarpRN.

## 2022-07-23 DEATH — deceased

## 2022-07-25 ENCOUNTER — Ambulatory Visit: Payer: BC Managed Care – PPO | Admitting: Oncology

## 2022-07-25 ENCOUNTER — Ambulatory Visit: Payer: BC Managed Care – PPO

## 2022-07-25 ENCOUNTER — Other Ambulatory Visit: Payer: BC Managed Care – PPO

## 2022-07-29 ENCOUNTER — Ambulatory Visit: Payer: BC Managed Care – PPO | Admitting: Oncology

## 2022-08-29 ENCOUNTER — Other Ambulatory Visit: Payer: BC Managed Care – PPO

## 2022-08-29 ENCOUNTER — Ambulatory Visit: Payer: BC Managed Care – PPO | Admitting: Oncology

## 2022-12-06 NOTE — Progress Notes (Signed)
  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  Beams/energy:   A 3D field set-up was employed with 6 MV X-rays  Narrative: The patient tolerated radiation treatment relatively well.   Plan: Patient deceased prior to completion of treatment. ________________________________  Sheral Apley. Tammi Klippel, M.D.

## 2023-06-14 IMAGING — US IR IMAGING GUIDED PORT INSERTION
1 series · 1 of 1 positions shown · non-contrast
Comparison: None.

INDICATION: 65-year-old male with history of bladder cancer.

EXAM:
IMPLANTED PORT A CATH PLACEMENT WITH ULTRASOUND AND FLUOROSCOPIC
GUIDANCE

[Series 1: ir fluoro/shunt/fist · 1 of 1 slices shown]
[im 1/1]
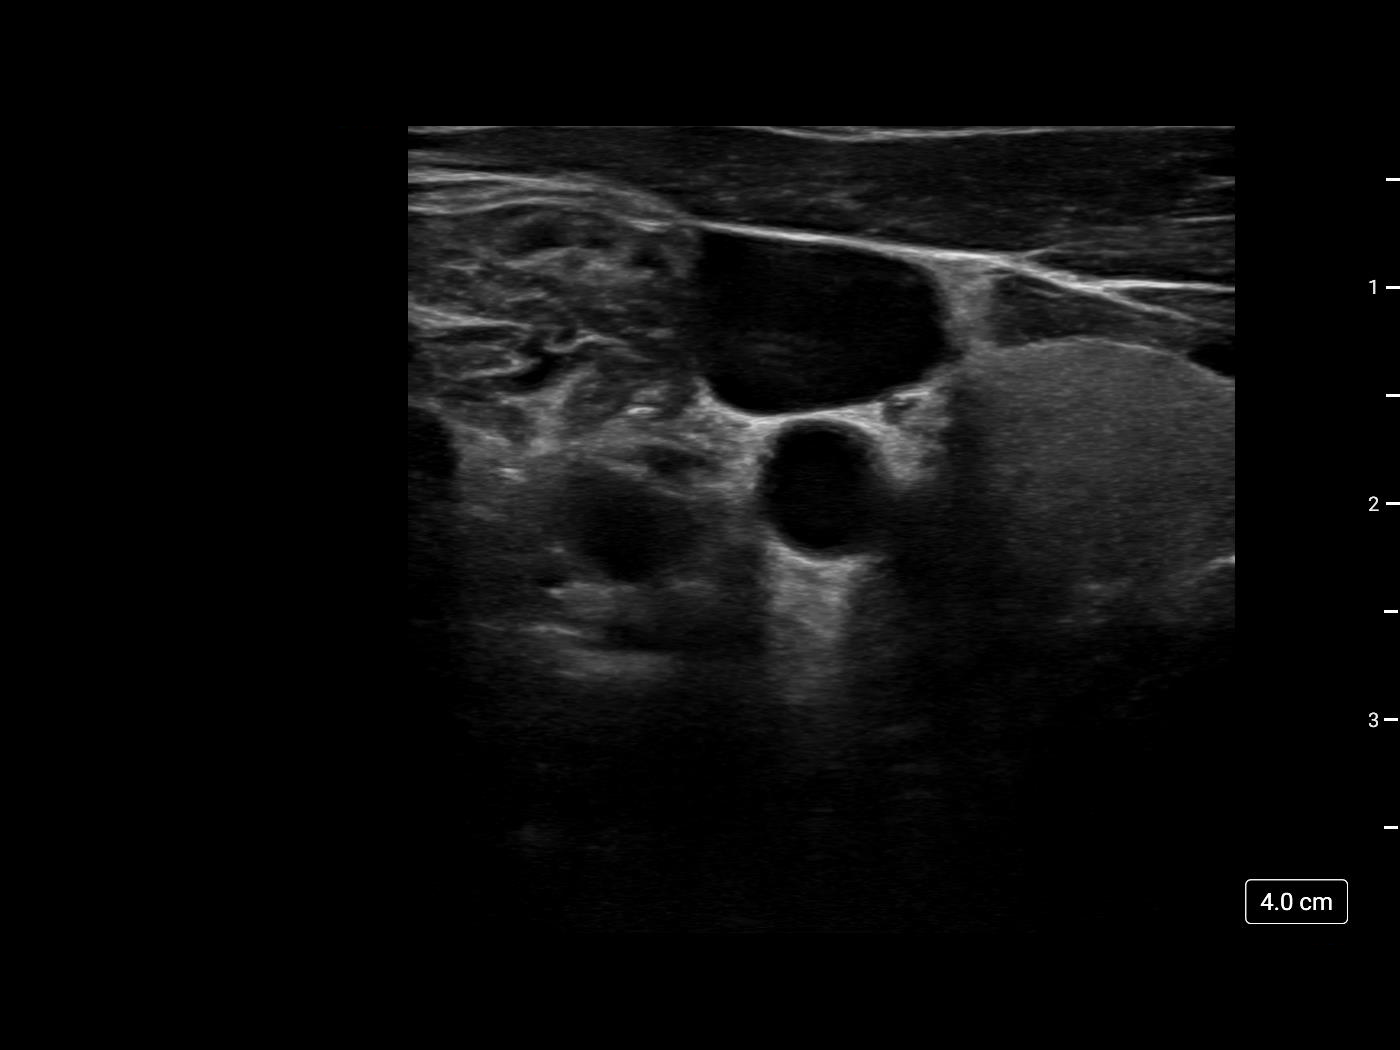

[1 of 1 positions shown; findings below may reference images not displayed]

MEDICATIONS:
None.

ANESTHESIA/SEDATION:
Moderate (conscious) sedation was employed during this procedure. A
total of Versed 1 mg and Fentanyl 50 mcg was administered
intravenously.

Moderate Sedation Time: 15 minutes. The patient's level of
consciousness and vital signs were monitored continuously by
radiology nursing throughout the procedure under my direct
supervision.

CONTRAST:  None

FLUOROSCOPY TIME:  0 minutes, 12 seconds (1 mGy)

COMPLICATIONS:
None immediate.

PROCEDURE:
The procedure, risks, benefits, and alternatives were explained to
the patient. Questions regarding the procedure were encouraged and
answered. The patient understands and consents to the procedure.

The right neck and chest were prepped with chlorhexidine in a
sterile fashion, and a sterile drape was applied covering the
operative field. Maximum barrier sterile technique with sterile
gowns and gloves were used for the procedure. A timeout was
performed prior to the initiation of the procedure.

Ultrasound was used to examine the jugular vein which was
compressible and free of internal echoes. A skin marker was used to
demarcate the planned venotomy and port pocket incision sites. Local
anesthesia was provided to these sites and the subcutaneous tunnel
track with 1% lidocaine with [DATE] epinephrine.

A small incision was created at the jugular access site and blunt
dissection was performed of the subcutaneous tissues. Under
ultrasound guidance, the jugular vein was accessed with a 21 ga
micropuncture needle and an 0.018" wire was inserted to the superior
vena cava. Real-time ultrasound guidance was utilized for vascular
access including the acquisition of a permanent ultrasound image
documenting patency of the accessed vessel. A 5 Fr micopuncture set
was then used, through which a 0.035" Rosen wire was passed under
fluoroscopic guidance into the inferior vena cava. An 8 Fr dilator
was then placed over the wire.

A subcutaneous port pocket was then created along the upper chest
wall utilizing a combination of sharp and blunt dissection. The
pocket was irrigated with sterile saline, packed with gauze, and
observed for hemorrhage. A single lumen "ISP" sized power injectable
port was chosen for placement. The 8 Fr catheter was tunneled from
the port pocket site to the venotomy incision. The port was placed
in the pocket. The external catheter was trimmed to appropriate
length. The dilator was exchanged for an 8 Fr peel-away sheath under
fluoroscopic guidance. The catheter was then placed through the
sheath and the sheath was removed. Final catheter positioning was
confirmed and documented with a fluoroscopic spot radiograph. The
port was accessed with [place] needle, aspirated, and flushed with
heparinized saline.

The deep dermal layer of the port pocket incision was closed with
interrupted 3-0 Vicryl suture. Dermabond was then placed over the
port pocket and neck incisions. The patient tolerated the procedure
well without immediate post procedural complication.
FINDINGS: After catheter placement, the tip lies within the superior
cavoatrial junction. The catheter aspirates and flushes normally and
is ready for immediate use.
IMPRESSION: Successful placement of a power injectable Port-A-Cath via the right
internal jugular vein. The catheter is ready for immediate use.
